# Patient Record
Sex: Female | Born: 1990 | Race: Black or African American | Hispanic: No | Marital: Single | Smoking: Never smoker
Health system: Southern US, Community
[De-identification: ages and names within clinical notes are randomized; demographics above are authoritative.]

## PROBLEM LIST (undated history)

## (undated) ENCOUNTER — Inpatient Hospital Stay (HOSPITAL_COMMUNITY): Payer: Self-pay

## (undated) DIAGNOSIS — Z8759 Personal history of other complications of pregnancy, childbirth and the puerperium: Secondary | ICD-10-CM

## (undated) DIAGNOSIS — I10 Essential (primary) hypertension: Secondary | ICD-10-CM

## (undated) DIAGNOSIS — R011 Cardiac murmur, unspecified: Secondary | ICD-10-CM

## (undated) DIAGNOSIS — D649 Anemia, unspecified: Secondary | ICD-10-CM

## (undated) DIAGNOSIS — N871 Moderate cervical dysplasia: Secondary | ICD-10-CM

## (undated) DIAGNOSIS — R87629 Unspecified abnormal cytological findings in specimens from vagina: Secondary | ICD-10-CM

## (undated) DIAGNOSIS — M419 Scoliosis, unspecified: Secondary | ICD-10-CM

## (undated) HISTORY — DX: Unspecified abnormal cytological findings in specimens from vagina: R87.629

## (undated) HISTORY — DX: Personal history of other complications of pregnancy, childbirth and the puerperium: Z87.59

## (undated) HISTORY — DX: Moderate cervical dysplasia: N87.1

## (undated) HISTORY — DX: Cardiac murmur, unspecified: R01.1

## (undated) HISTORY — DX: Anemia, unspecified: D64.9

## (undated) HISTORY — DX: Scoliosis, unspecified: M41.9

## (undated) HISTORY — PX: LEEP: SHX91

---

## 2010-04-13 ENCOUNTER — Ambulatory Visit: Admit: 2010-04-13 | Payer: Self-pay | Admitting: Obstetrics & Gynecology

## 2010-07-31 ENCOUNTER — Inpatient Hospital Stay (HOSPITAL_COMMUNITY)
Admission: AD | Admit: 2010-07-31 | Discharge: 2010-07-31 | Disposition: A | Discharge status: Home / Self Care | Payer: Medicaid Other | Source: Ambulatory Visit | Attending: Obstetrics & Gynecology | Admitting: Obstetrics & Gynecology

## 2010-07-31 DIAGNOSIS — O99891 Other specified diseases and conditions complicating pregnancy: Secondary | ICD-10-CM | POA: Insufficient documentation

## 2010-07-31 LAB — URINALYSIS, ROUTINE W REFLEX MICROSCOPIC
Bilirubin Urine: NEGATIVE
Glucose, UA: NEGATIVE mg/dL
Hgb urine dipstick: NEGATIVE
Ketones, ur: 40 mg/dL — AB
Nitrite: NEGATIVE
Protein, ur: 30 mg/dL — AB
Specific Gravity, Urine: 1.025 (ref 1.005–1.030)
Urobilinogen, UA: 1 mg/dL (ref 0.0–1.0)
pH: 7 (ref 5.0–8.0)

## 2010-07-31 LAB — URINE MICROSCOPIC-ADD ON

## 2010-07-31 LAB — POCT PREGNANCY, URINE: Preg Test, Ur: POSITIVE

## 2010-09-16 ENCOUNTER — Inpatient Hospital Stay (HOSPITAL_COMMUNITY)
Admission: AD | Admit: 2010-09-16 | Discharge: 2010-09-16 | Disposition: A | Discharge status: Home / Self Care | Payer: Medicaid Other | Source: Ambulatory Visit | Attending: Obstetrics & Gynecology | Admitting: Obstetrics & Gynecology

## 2010-09-16 DIAGNOSIS — O210 Mild hyperemesis gravidarum: Secondary | ICD-10-CM | POA: Insufficient documentation

## 2010-09-16 DIAGNOSIS — K5289 Other specified noninfective gastroenteritis and colitis: Secondary | ICD-10-CM | POA: Insufficient documentation

## 2010-09-16 DIAGNOSIS — O99891 Other specified diseases and conditions complicating pregnancy: Secondary | ICD-10-CM | POA: Insufficient documentation

## 2010-09-16 DIAGNOSIS — O9989 Other specified diseases and conditions complicating pregnancy, childbirth and the puerperium: Secondary | ICD-10-CM

## 2010-09-16 LAB — URINE MICROSCOPIC-ADD ON

## 2010-09-16 LAB — URINALYSIS, ROUTINE W REFLEX MICROSCOPIC
Bilirubin Urine: NEGATIVE
Glucose, UA: NEGATIVE mg/dL
Ketones, ur: 15 mg/dL — AB
Leukocytes, UA: NEGATIVE
Nitrite: NEGATIVE
Protein, ur: NEGATIVE mg/dL
Specific Gravity, Urine: >1.03 — ABNORMAL HIGH (ref 1.005–1.030)
Urobilinogen, UA: 0.2 mg/dL (ref 0.0–1.0)
pH: 6 (ref 5.0–8.0)

## 2010-10-31 LAB — ANTIBODY SCREEN: Antibody Screen: NEGATIVE

## 2010-10-31 LAB — RPR: RPR: NONREACTIVE

## 2010-10-31 LAB — HIV ANTIBODY (ROUTINE TESTING W REFLEX): HIV: NONREACTIVE

## 2010-10-31 LAB — ABO/RH: RH Type: POSITIVE

## 2010-10-31 LAB — RUBELLA ANTIBODY, IGM: Rubella: IMMUNE

## 2010-10-31 LAB — HEPATITIS B SURFACE ANTIGEN: Hepatitis B Surface Ag: NEGATIVE

## 2011-02-18 LAB — STREP B DNA PROBE: GBS: NEGATIVE

## 2011-03-14 ENCOUNTER — Encounter (HOSPITAL_COMMUNITY): Payer: Self-pay | Admitting: *Deleted

## 2011-03-14 ENCOUNTER — Telehealth (HOSPITAL_COMMUNITY): Payer: Self-pay | Admitting: *Deleted

## 2011-03-14 NOTE — Telephone Encounter (Signed)
Preadmission screen  

## 2011-03-15 ENCOUNTER — Other Ambulatory Visit: Payer: Self-pay | Admitting: Obstetrics and Gynecology

## 2011-03-15 DIAGNOSIS — IMO0001 Reserved for inherently not codable concepts without codable children: Secondary | ICD-10-CM

## 2011-03-19 ENCOUNTER — Encounter (HOSPITAL_COMMUNITY): Payer: Self-pay

## 2011-03-19 ENCOUNTER — Inpatient Hospital Stay (HOSPITAL_COMMUNITY)
Admission: AD | Admit: 2011-03-19 | Discharge: 2011-03-22 | DRG: 774 | Disposition: A | Discharge status: Home / Self Care | Payer: Medicaid Other | Source: Ambulatory Visit | Attending: Obstetrics and Gynecology | Admitting: Obstetrics and Gynecology

## 2011-03-19 ENCOUNTER — Inpatient Hospital Stay (HOSPITAL_COMMUNITY): Payer: Medicaid Other | Admitting: Anesthesiology

## 2011-03-19 ENCOUNTER — Encounter (HOSPITAL_COMMUNITY): Payer: Self-pay | Admitting: Anesthesiology

## 2011-03-19 DIAGNOSIS — O09299 Supervision of pregnancy with other poor reproductive or obstetric history, unspecified trimester: Secondary | ICD-10-CM

## 2011-03-19 DIAGNOSIS — IMO0002 Reserved for concepts with insufficient information to code with codable children: Principal | ICD-10-CM | POA: Diagnosis present

## 2011-03-19 DIAGNOSIS — O1490 Unspecified pre-eclampsia, unspecified trimester: Secondary | ICD-10-CM

## 2011-03-19 DIAGNOSIS — D649 Anemia, unspecified: Secondary | ICD-10-CM | POA: Diagnosis not present

## 2011-03-19 DIAGNOSIS — O9903 Anemia complicating the puerperium: Secondary | ICD-10-CM | POA: Diagnosis not present

## 2011-03-19 DIAGNOSIS — O2690 Pregnancy related conditions, unspecified, unspecified trimester: Secondary | ICD-10-CM

## 2011-03-19 LAB — COMPREHENSIVE METABOLIC PANEL
ALT: 12 U/L (ref 0–35)
AST: 20 U/L (ref 0–37)
Albumin: 2.1 g/dL — ABNORMAL LOW (ref 3.5–5.2)
Alkaline Phosphatase: 165 U/L — ABNORMAL HIGH (ref 39–117)
BUN: 6 mg/dL (ref 6–23)
CO2: 23 mEq/L (ref 19–32)
Calcium: 8.6 mg/dL (ref 8.4–10.5)
Chloride: 104 mEq/L (ref 96–112)
Creatinine, Ser: 0.71 mg/dL (ref 0.50–1.10)
GFR calc Af Amer: >90 mL/min (ref >90)
GFR calc non Af Amer: >90 mL/min (ref >90)
Glucose, Bld: 89 mg/dL (ref 70–99)
Potassium: 3.6 mEq/L (ref 3.5–5.1)
Sodium: 134 mEq/L — ABNORMAL LOW (ref 135–145)
Total Bilirubin: 0.2 mg/dL — ABNORMAL LOW (ref 0.3–1.2)
Total Protein: 6.1 g/dL (ref 6.0–8.3)

## 2011-03-19 LAB — DIFFERENTIAL
Basophils Absolute: 0 10*3/uL (ref 0.0–0.1)
Basophils Relative: 0 % (ref 0–1)
Eosinophils Absolute: 0.1 10*3/uL (ref 0.0–0.7)
Eosinophils Relative: 1 % (ref 0–5)
Lymphocytes Relative: 14 % (ref 12–46)
Lymphs Abs: 1.2 10*3/uL (ref 0.7–4.0)
Monocytes Absolute: 0.9 10*3/uL (ref 0.1–1.0)
Monocytes Relative: 10 % (ref 3–12)
Neutro Abs: 6.4 10*3/uL (ref 1.7–7.7)
Neutrophils Relative %: 74 % (ref 43–77)

## 2011-03-19 LAB — URINE MICROSCOPIC-ADD ON

## 2011-03-19 LAB — CBC
HCT: 27.9 % — ABNORMAL LOW (ref 36.0–46.0)
Hemoglobin: 8.9 g/dL — ABNORMAL LOW (ref 12.0–15.0)
MCH: 25.4 pg — ABNORMAL LOW (ref 26.0–34.0)
MCHC: 31.9 g/dL (ref 30.0–36.0)
MCV: 79.5 fL (ref 78.0–100.0)
Platelets: 200 10*3/uL (ref 150–400)
RBC: 3.51 MIL/uL — ABNORMAL LOW (ref 3.87–5.11)
RDW: 17.1 % — ABNORMAL HIGH (ref 11.5–15.5)
WBC: 8.6 10*3/uL (ref 4.0–10.5)

## 2011-03-19 LAB — URINALYSIS, ROUTINE W REFLEX MICROSCOPIC
Bilirubin Urine: NEGATIVE
Bilirubin Urine: NEGATIVE
Glucose, UA: NEGATIVE mg/dL
Glucose, UA: NEGATIVE mg/dL
Hgb urine dipstick: NEGATIVE
Ketones, ur: NEGATIVE mg/dL
Ketones, ur: NEGATIVE mg/dL
Leukocytes, UA: NEGATIVE
Leukocytes, UA: NEGATIVE
Nitrite: NEGATIVE
Nitrite: NEGATIVE
Protein, ur: >300 mg/dL — AB
Protein, ur: >300 mg/dL — AB
Specific Gravity, Urine: 1.015 (ref 1.005–1.030)
Specific Gravity, Urine: 1.02 (ref 1.005–1.030)
Urobilinogen, UA: 0.2 mg/dL (ref 0.0–1.0)
Urobilinogen, UA: 0.2 mg/dL (ref 0.0–1.0)
pH: 7.5 (ref 5.0–8.0)
pH: 7.5 (ref 5.0–8.0)

## 2011-03-19 LAB — RPR: RPR Ser Ql: NONREACTIVE

## 2011-03-19 LAB — LACTATE DEHYDROGENASE: LDH: 239 U/L (ref 94–250)

## 2011-03-19 LAB — URIC ACID: Uric Acid, Serum: 3.1 mg/dL (ref 2.4–7.0)

## 2011-03-19 MED ORDER — MAGNESIUM SULFATE 40 G IN LACTATED RINGERS - SIMPLE: 2.0000 g/h | INTRAVENOUS | Status: DC

## 2011-03-19 MED ORDER — MAGNESIUM SULFATE 40 MG/ML IJ SOLN: 4.0000 g | Freq: Once | INTRAMUSCULAR | Status: DC

## 2011-03-19 MED ORDER — PHENYLEPHRINE 40 MCG/ML (10ML) SYRINGE FOR IV PUSH (FOR BLOOD PRESSURE SUPPORT): 80.0000 ug | PREFILLED_SYRINGE | INTRAVENOUS | Status: DC | PRN

## 2011-03-19 MED ORDER — OXYTOCIN BOLUS FROM INFUSION
500.0000 mL | Freq: Once | INTRAVENOUS | Status: DC
  Filled 2011-03-19: qty 500

## 2011-03-19 MED ORDER — OXYTOCIN 20 UNITS IN LACTATED RINGERS INFUSION - SIMPLE: 125.0000 mL/h | Freq: Once | INTRAVENOUS | Status: DC

## 2011-03-19 MED ORDER — SODIUM CHLORIDE 0.9 % IJ SOLN: 3.0000 mL | INTRAMUSCULAR | Status: DC | PRN

## 2011-03-19 MED ORDER — ONDANSETRON HCL 4 MG/2ML IJ SOLN
4.0000 mg | INTRAMUSCULAR | Status: DC | PRN
  Administered 2011-03-19: 4 mg via INTRAVENOUS
  Filled 2011-03-19: qty 2

## 2011-03-19 MED ORDER — MAGNESIUM SULFATE 40 G IN LACTATED RINGERS - SIMPLE
2.0000 g/h | INTRAVENOUS | Status: AC
  Administered 2011-03-19: 2 g/h via INTRAVENOUS
  Filled 2011-03-19 (×2): qty 500

## 2011-03-19 MED ORDER — OXYTOCIN 20 UNITS IN LACTATED RINGERS INFUSION - SIMPLE
1.0000 m[IU]/min | INTRAVENOUS | Status: DC
  Administered 2011-03-19: 1 m[IU]/min via INTRAVENOUS
  Filled 2011-03-19: qty 1000

## 2011-03-19 MED ORDER — DIPHENHYDRAMINE HCL 50 MG/ML IJ SOLN: 12.5000 mg | INTRAMUSCULAR | Status: DC | PRN

## 2011-03-19 MED ORDER — PROMETHAZINE HCL 25 MG/ML IJ SOLN: 25.0000 mg | INTRAMUSCULAR | Status: DC | PRN

## 2011-03-19 MED ORDER — MISOPROSTOL 25 MCG QUARTER TABLET
25.0000 ug | ORAL_TABLET | ORAL | Status: DC | PRN
  Administered 2011-03-19: 25 ug via VAGINAL
  Filled 2011-03-19: qty 0.25

## 2011-03-19 MED ORDER — NALBUPHINE SYRINGE 5 MG/0.5 ML: 10.0000 mg | INJECTION | INTRAMUSCULAR | Status: DC | PRN

## 2011-03-19 MED ORDER — LABETALOL HCL 5 MG/ML IV SOLN
10.0000 mg | INTRAVENOUS | Status: DC | PRN
  Administered 2011-03-20: 10 mg via INTRAVENOUS
  Administered 2011-03-20: 20 mg via INTRAVENOUS
  Filled 2011-03-19: qty 8

## 2011-03-19 MED ORDER — CITRIC ACID-SODIUM CITRATE 334-500 MG/5ML PO SOLN: 30.0000 mL | ORAL | Status: DC | PRN

## 2011-03-19 MED ORDER — EPHEDRINE 5 MG/ML INJ: 10.0000 mg | INTRAVENOUS | Status: DC | PRN

## 2011-03-19 MED ORDER — LABETALOL HCL 100 MG PO TABS
100.0000 mg | ORAL_TABLET | Freq: Two times a day (BID) | ORAL | Status: DC
  Administered 2011-03-19 – 2011-03-22 (×6): 100 mg via ORAL
  Filled 2011-03-19 (×7): qty 1

## 2011-03-19 MED ORDER — OXYTOCIN 20 UNITS IN LACTATED RINGERS INFUSION - SIMPLE: 1.0000 m[IU]/min | INTRAVENOUS | Status: DC

## 2011-03-19 MED ORDER — TERBUTALINE SULFATE 1 MG/ML IJ SOLN: 0.2500 mg | Freq: Once | INTRAMUSCULAR | Status: AC | PRN

## 2011-03-19 MED ORDER — FENTANYL 2.5 MCG/ML BUPIVACAINE 1/10 % EPIDURAL INFUSION (WH - ANES): 14.0000 mL/h | INTRAMUSCULAR | Status: DC

## 2011-03-19 MED ORDER — SODIUM CHLORIDE 0.9 % IV SOLN: 250.0000 mL | INTRAVENOUS | Status: DC | PRN

## 2011-03-19 MED ORDER — ACETAMINOPHEN 325 MG PO TABS: 650.0000 mg | ORAL_TABLET | ORAL | Status: DC | PRN

## 2011-03-19 MED ORDER — LACTATED RINGERS IV SOLN
INTRAVENOUS | Status: DC
  Administered 2011-03-19 – 2011-03-20 (×4): via INTRAVENOUS

## 2011-03-19 MED ORDER — SODIUM CHLORIDE 0.9 % IJ SOLN: 3.0000 mL | Freq: Two times a day (BID) | INTRAMUSCULAR | Status: DC

## 2011-03-19 MED ORDER — LACTATED RINGERS IV SOLN: 500.0000 mL | Freq: Once | INTRAVENOUS | Status: DC

## 2011-03-19 MED ORDER — IBUPROFEN 600 MG PO TABS: 600.0000 mg | ORAL_TABLET | Freq: Four times a day (QID) | ORAL | Status: DC | PRN

## 2011-03-19 MED ORDER — LACTATED RINGERS IV SOLN: 500.0000 mL | INTRAVENOUS | Status: DC | PRN

## 2011-03-19 MED ORDER — LIDOCAINE HCL (PF) 1 % IJ SOLN
30.0000 mL | INTRAMUSCULAR | Status: DC | PRN
  Administered 2011-03-20: 30 mL via SUBCUTANEOUS
  Filled 2011-03-19: qty 30

## 2011-03-19 MED ORDER — MAGNESIUM SULFATE BOLUS VIA INFUSION
4.0000 g | Freq: Once | INTRAVENOUS | Status: AC
  Administered 2011-03-19: 4 g via INTRAVENOUS
  Filled 2011-03-19: qty 500

## 2011-03-19 MED ORDER — FLEET ENEMA 7-19 GM/118ML RE ENEM: 1.0000 | ENEMA | RECTAL | Status: DC | PRN

## 2011-03-19 MED ORDER — ZOLPIDEM TARTRATE 10 MG PO TABS: 10.0000 mg | ORAL_TABLET | Freq: Every evening | ORAL | Status: DC | PRN

## 2011-03-19 MED ORDER — OXYCODONE-ACETAMINOPHEN 5-325 MG PO TABS: 2.0000 | ORAL_TABLET | ORAL | Status: DC | PRN

## 2011-03-19 MED ORDER — LABETALOL HCL 100 MG PO TABS
100.0000 mg | ORAL_TABLET | Freq: Once | ORAL | Status: AC
  Administered 2011-03-19: 100 mg via ORAL
  Filled 2011-03-19: qty 1

## 2011-03-19 NOTE — Plan of Care (Signed)
Problem: Consults Goal: Birthing Suites Patient Information Press F2 to bring up selections list  Outcome: Completed/Met Date Met:  03/19/11  Pt > [redacted] weeks EGA, Inpatient induction and PIH (Pregnancy induced hypertension)

## 2011-03-19 NOTE — Progress Notes (Signed)
[name] is a 21 y.o. G1P0000 at [redacted]w[redacted]d with c/o of contractions since yesterday, hurting in front and back with uc, c/o of pink mucus,  No watery leaking. With +fm. Denies ha, visual spots or blurring, no upper abd pain, with swelling just to ankles.  Problem list: scoloisis Hx heart murmur Late PNC Anemia GBS-  Objective: BP 166/108  Pulse 81  Temp(Src) 99.2 F (37.3 C) (Oral)  Resp 20  Ht 5\' 7"  (1.702 m)  Wt 170 lb (77.111 kg)  BMI 26.63 kg/m2  SpO2 97%  LMP 06/25/2010      Physical Exam:  Gen: calm quiet, no acute distress Chest/Lungs: cta bilaterally  Heart/Pulse: RRR  Abdomen: soft, gravid, nontender, BX x4 quad Uterine fundus: soft, nontender Skin & Color: warm and dry  Neurological: AOx3, DTRs +2 bilaterally EXT: negative Homan's b/l, edema +2 pitting  FHT:  FHR: 130 bpm, variability: moderate,  accelerations:  Present,  decelerations:  Absent UC:   regular, every 2-6 minutes 40- 140 second duration mild SVE:   Dilation: Closed Effacement (%): 70 Exam by:: [name] CNM  Labs: No results found for this basename: WBC, HGB, HCT, MCV, PLT    Assessment and Plan:  does not have a problem list on file. 40 week IUP HTN Plan discussed assessment with Dr. Stefano [name] per telephone. PIH labs, labetalol 100 mg now, IV LR, zofran. Discussed HTN vs Pre Eclampsia with pt and family.   [name] 03/19/2011, 8:44 AM

## 2011-03-19 NOTE — Consult Note (Signed)
Subjective:  The patient denies headaches, blurred vision, and right upper quadrant tenderness. She reports that her contractions are mild.  Objective:  BP 147/105  Pulse 70  Temp(Src) 99.2 F (37.3 C) (Oral)  Resp 20  Ht 5\' 7"  (1.702 m)  Wt 77.111 kg (170 lb)  BMI 26.63 kg/m2  SpO2 97%  LMP 06/25/2010  CBC    Component Value Date/Time   WBC 8.6 03/19/2011 0910   RBC 3.51* 03/19/2011 0910   HGB 8.9* 03/19/2011 0910   HCT 27.9* 03/19/2011 0910   PLT 200 03/19/2011 0910   MCV 79.5 03/19/2011 0910   MCH 25.4* 03/19/2011 0910   MCHC 31.9 03/19/2011 0910   RDW 17.1* 03/19/2011 0910   LYMPHSABS 1.2 03/19/2011 0910   MONOABS 0.9 03/19/2011 0910   EOSABS 0.1 03/19/2011 0910   BASOSABS 0.0 03/19/2011 0910    CMP     Component Value Date/Time   NA 134* 03/19/2011 0910   K 3.6 03/19/2011 0910   CL 104 03/19/2011 0910   CO2 23 03/19/2011 0910   GLUCOSE 89 03/19/2011 0910   BUN 6 03/19/2011 0910   CREATININE 0.71 03/19/2011 0910   CALCIUM 8.6 03/19/2011 0910   PROT 6.1 03/19/2011 0910   ALBUMIN 2.1* 03/19/2011 0910   AST 20 03/19/2011 0910   ALT 12 03/19/2011 0910   ALKPHOS 165* 03/19/2011 0910   BILITOT 0.2* 03/19/2011 0910   GFRNONAA >90 03/19/2011 0910   GFRAA >90 03/19/2011 0910    Chest: Clear  Heart: Regular rate and rhythm  Abdomen: Gravid and nontender  Extremities: 1-2+ edema, no cords or masses. No redness.  Neurologic: Reflexes are 3+/4. There is no clonus present.  Assessment:  [redacted] weeks gestation  Pregnancy-induced hypertension  Must rule out preeclampsia  Unfavorable cervix  Plan:  Preeclampsia was discussed. The natural history of the disease was reviewed. Our concerns were outlined.  I discussed our management options with the patient and her mother. The options include induction of labor, catheterized the patient for a urinalysis, and discharge the patient to home to collect a 24 urine sample. The risk and benefits of each of those options were outlined. The patient elected to catheterize  the bladder and then make our decisions based on the results.  Will use labetalol to reduce her blood pressure below 140/90.  Mylinda Latina.D.

## 2011-03-19 NOTE — Progress Notes (Signed)
Patient states she is having frequent contractions with a little brown show. Reports good fetal movement and no leaking.

## 2011-03-19 NOTE — L&D Delivery Note (Signed)
Delivery Note  Pt was complete and pushed well, on r side, fhr with variables but overall reassuring   At 4:50 AM a viable female was delivered via Vaginal, Spontaneous Delivery (Presentation: Left Occiput Anterior). With posterior arm around neck and occult cord at neck, shoulders del easily  APGAR: 9, 9; weight 6 lb 4 oz (2835 g).   Placenta status: Intact, Spontaneous.  Cord: 3 vessels with the following complications: None.   Placenta sent to path Anesthesia: Local 1% lidocaine  Episiotomy: None Lacerations: L 1st degree vaginal Suture Repair: 3.0 vicryl rapide Est. Blood Loss (mL): 350  Mom to postpartum.  Baby to nursery-stable. Dr Stefano Gaul notified Will D/C 24hr urine Continue Mag  Sulfate until at 0530am on 03-21-11   Cassady Stanczak M 03/20/2011, 5:25 AM

## 2011-03-19 NOTE — Anesthesia Preprocedure Evaluation (Deleted)
Anesthesia Evaluation  Patient identified by MRN, date of birth, ID band Patient awake    Reviewed: Allergy & Precautions, H&P , Patient's Chart, lab work & pertinent test results  Airway Mallampati: IV TM Distance: >3 FB Neck ROM: full  Mouth opening: Limited Mouth Opening  Dental  (+) Teeth Intact   Pulmonary  clear to auscultation        Cardiovascular hypertension (Patient has PIH), regular Normal    Neuro/Psych    GI/Hepatic   Endo/Other    Renal/GU      Musculoskeletal   Abdominal   Peds  Hematology   Anesthesia Other Findings       Reproductive/Obstetrics (+) Pregnancy                           Anesthesia Physical Anesthesia Plan  ASA: II  Anesthesia Plan: Epidural   Post-op Pain Management:    Induction:   Airway Management Planned:   Additional Equipment:   Intra-op Plan:   Post-operative Plan:   Informed Consent: I have reviewed the patients History and Physical, chart, labs and discussed the procedure including the risks, benefits and alternatives for the proposed anesthesia with the patient or authorized representative who has indicated his/her understanding and acceptance.   Dental Advisory Given  Plan Discussed with:   Anesthesia Plan Comments: (Labs checked- platelets confirmed with RN in room. Fetal heart tracing, per RN, reported to be stable enough for sitting procedure. Discussed epidural, and patient consents to the procedure and the increased difficulty with scoliosis:  included risk of possible headache,backache, failed block, allergic reaction, and nerve injury. This patient was asked if she had any questions or concerns before the procedure started. )        Anesthesia Quick Evaluation

## 2011-03-19 NOTE — Progress Notes (Signed)
Patient ID: [name], female   DOB: [birth year], 21 y.o.   MRN: [id number] .Subjective: Feels ctx more, but denies pain meds, lots of family at bs. In good spirits. Denies bloody show or LOF, denies HA/N/VRUQ pain or blurry vision   Objective: BP 157/97  Pulse 90  Temp(Src) 98.7 F (37.1 C) (Oral)  Resp 18  Ht 5\' 7"  (1.702 m)  Wt 77.111 kg (170 lb)  BMI 26.63 kg/m2  SpO2 98%  LMP 06/25/2010   Filed Vitals:   03/19/11 1701 03/19/11 1803 03/19/11 1901 03/19/11 2001  BP: 142/88 134/112 156/98 157/97  Pulse: 80 85 90 90  Temp:    98.6 F (37 C)  TempSrc:    Oral  Resp: 18 20 18 18   Height:      Weight:      SpO2:         FHT:  FHR: 130 bpm, variability: moderate,  accelerations:  Present,  decelerations:  Absent UC:   regular, every 3-5 minutes SVE:   Dilation: 2 Effacement (%): 70 Station: -2 Exam by:: S Kahliya Fraleigh, CNM  Swept membranes, cervix to R side and posterior rcv'd dose of lebetalol in MAU   Assessment / Plan: IOL secondary to PIH/vs. Pre-eclampsia GBS neg S/p cytotec, reg ctx pattern now, some cervical change  No s/s of Mag toxicity  Will start low dose pitocin, ambien for sleep   Fetal Wellbeing:  Category I Pain Control:  declines at this time, plans IV pain meds, discussed epidural, pt is concerned about epidural secondary to hx scoliosis, anesthesia consult requested  C/W Dr. Darrick Meigs M 03/19/2011, 8:25 PM

## 2011-03-19 NOTE — Progress Notes (Signed)
Pt has scoliosis. Dr Jean Rosenthal at bedside to evaluate pt for epidural placement when patient is ready.

## 2011-03-19 NOTE — Progress Notes (Signed)
Subjective:  The patient reports that her contractions are less frequent and less painful at this time.  Objective:  BP 149/98  Pulse 73  Temp(Src) 98.5 F (36.9 C) (Oral)  Resp 22  Ht 5\' 7"  (1.702 m)  Wt 77.111 kg (170 lb)  BMI 26.63 kg/m2  SpO2 97%  LMP 06/25/2010  Catheter UA: Greater than 300 protein  Nonstress test: Category 1, mild contractions  Assessment:  [redacted] weeks gestation  Preeclampsia  Unfavorable cervix  Plan:  I again discussed our management options. The patient agrees with induction of labor at this time. We will begin with Cytotec intravaginally and then follow with Pitocin. We will also start magnesium.  Mylinda Latina.D.

## 2011-03-19 NOTE — Progress Notes (Signed)
24 hour urine started 03/19/11 at 0850am.

## 2011-03-19 NOTE — H&P (Signed)
[name] is a 21 y.o. female presenting for contractions and bloody mucus, denies srom, with +FM, denies ha,visual spots or blurring, with swelling to ankles only  Problem list scoliosis Heart murmur Late PNC Anemia  History OB History    Grav Para Term Preterm Abortions TAB SAB Ect Mult Living   1 0 0 0 0 0 0 0 0 0      Past Medical History  Diagnosis Date  . Anemia   . Scoliosis   . Heart murmur   . Late prenatal care    Past Surgical History  Procedure Date  . No past surgeries    Family History: family history includes Anemia in her mother; Heart attack in her maternal grandmother; Heart murmur in her brother; Migraines in her mother; and Mitral valve prolapse in her maternal grandmother and sister. Social History:  reports that she has never smoked. She has never used smokeless tobacco. She reports that she does not drink alcohol or use illicit drugs.  ROS  Dilation: Closed Effacement (%): 70 Exam by:: Judie Petit Glenys Snader CNM Blood pressure 149/98, pulse 73, temperature 98.5 F (36.9 C), temperature source Oral, resp. rate 22, height 5\' 7"  (1.702 m), weight 170 lb (77.111 kg), last menstrual period 06/25/2010, SpO2 97.00%. Maternal Exam:  Introitus: Vaginal discharge: bloody show.    Physical Exam  Constitutional: She appears well-nourished.  HENT:  Head: Normocephalic.  Cardiovascular: Normal rate and regular rhythm.   Respiratory: Effort normal and breath sounds normal.  GI: Soft. Bowel sounds are normal.  Genitourinary: Vagina normal and uterus normal. Vaginal discharge: bloody show.  Musculoskeletal: Normal range of motion.  Neurological: She is alert. Abnormal reflex: DTRS +2 bilaterally, no clonus, +2 edema lower legs.  Skin: Skin is warm.  Psychiatric: Her behavior is normal.    Prenatal labs: ABO, Rh: A/Positive/-- (08/15 0000) Antibody: Negative (08/15 0000) Rubella: Immune (08/15 0000) RPR: Nonreactive (08/15 0000)  HBsAg: Negative (08/15 0000)    HIV: Non-reactive (08/15 0000)  GBS: Negative (12/03 0000)   Assessment/Plan: 40 week IUP Pre eclampsia Plan: PIH labs reviewed, ua was clean catch, now cath UA with greater than 300 protein, admit for cytotec ripening, pitocin when indicated. Pt and family verbalized understanding of diagnosis and medications. Collaboration with Dr. Stefano Gaul per telephone. Continue labetalol 100 mg bid.  Maclin Guerrette 03/19/2011, 2:15 PM

## 2011-03-20 ENCOUNTER — Encounter (HOSPITAL_COMMUNITY): Payer: Self-pay | Admitting: *Deleted

## 2011-03-20 ENCOUNTER — Other Ambulatory Visit: Payer: Self-pay | Admitting: Obstetrics and Gynecology

## 2011-03-20 LAB — COMPREHENSIVE METABOLIC PANEL
ALT: 13 U/L (ref 0–35)
AST: 23 U/L (ref 0–37)
Albumin: 1.8 g/dL — ABNORMAL LOW (ref 3.5–5.2)
Alkaline Phosphatase: 154 U/L — ABNORMAL HIGH (ref 39–117)
BUN: 6 mg/dL (ref 6–23)
CO2: 20 mEq/L (ref 19–32)
Calcium: 7.7 mg/dL — ABNORMAL LOW (ref 8.4–10.5)
Chloride: 103 mEq/L (ref 96–112)
Creatinine, Ser: 0.68 mg/dL (ref 0.50–1.10)
GFR calc Af Amer: >90 mL/min (ref >90)
GFR calc non Af Amer: >90 mL/min (ref >90)
Glucose, Bld: 144 mg/dL — ABNORMAL HIGH (ref 70–99)
Potassium: 4.1 mEq/L (ref 3.5–5.1)
Sodium: 135 mEq/L (ref 135–145)
Total Bilirubin: 0.3 mg/dL (ref 0.3–1.2)
Total Protein: 5.2 g/dL — ABNORMAL LOW (ref 6.0–8.3)

## 2011-03-20 LAB — CBC
HCT: 27.5 % — ABNORMAL LOW (ref 36.0–46.0)
HCT: 21.5 % — ABNORMAL LOW (ref 36.0–46.0)
Hemoglobin: 8.7 g/dL — ABNORMAL LOW (ref 12.0–15.0)
Hemoglobin: 6.9 g/dL — CL (ref 12.0–15.0)
MCH: 25.1 pg — ABNORMAL LOW (ref 26.0–34.0)
MCH: 24.9 pg — ABNORMAL LOW (ref 26.0–34.0)
MCHC: 31.6 g/dL (ref 30.0–36.0)
MCHC: 31.6 g/dL (ref 30.0–36.0)
MCV: 79.3 fL (ref 78.0–100.0)
MCV: 78.8 fL (ref 78.0–100.0)
Platelets: 207 10*3/uL (ref 150–400)
Platelets: 184 10*3/uL (ref 150–400)
RBC: 3.47 MIL/uL — ABNORMAL LOW (ref 3.87–5.11)
RBC: 2.73 MIL/uL — ABNORMAL LOW (ref 3.87–5.11)
RDW: 17.5 % — ABNORMAL HIGH (ref 11.5–15.5)
RDW: 17.6 % — ABNORMAL HIGH (ref 11.5–15.5)
WBC: 10.7 10*3/uL — ABNORMAL HIGH (ref 4.0–10.5)
WBC: 10.8 10*3/uL — ABNORMAL HIGH (ref 4.0–10.5)

## 2011-03-20 LAB — MRSA PCR SCREENING: MRSA by PCR: NEGATIVE

## 2011-03-20 LAB — MAGNESIUM: Magnesium: 5.4 mg/dL — ABNORMAL HIGH (ref 1.5–2.5)

## 2011-03-20 MED ORDER — IBUPROFEN 600 MG PO TABS
600.0000 mg | ORAL_TABLET | Freq: Four times a day (QID) | ORAL | Status: DC
  Filled 2011-03-20 (×5): qty 1

## 2011-03-20 MED ORDER — OXYCODONE-ACETAMINOPHEN 5-325 MG PO TABS: 1.0000 | ORAL_TABLET | ORAL | Status: DC | PRN

## 2011-03-20 MED ORDER — PRENATAL MULTIVITAMIN CH
1.0000 | ORAL_TABLET | Freq: Every day | ORAL | Status: DC
  Administered 2011-03-20 – 2011-03-21 (×2): 1 via ORAL
  Filled 2011-03-20 (×3): qty 1

## 2011-03-20 MED ORDER — SENNOSIDES-DOCUSATE SODIUM 8.6-50 MG PO TABS
2.0000 | ORAL_TABLET | Freq: Every day | ORAL | Status: DC
  Administered 2011-03-20 – 2011-03-21 (×2): 2 via ORAL

## 2011-03-20 MED ORDER — SIMETHICONE 80 MG PO CHEW: 80.0000 mg | CHEWABLE_TABLET | ORAL | Status: DC | PRN

## 2011-03-20 MED ORDER — POLYSACCHARIDE IRON 150 MG PO CAPS
150.0000 mg | ORAL_CAPSULE | Freq: Every day | ORAL | Status: DC
  Administered 2011-03-21 – 2011-03-22 (×2): 150 mg via ORAL
  Filled 2011-03-20 (×3): qty 1

## 2011-03-20 MED ORDER — FENTANYL CITRATE 0.05 MG/ML IJ SOLN
100.0000 ug | INTRAMUSCULAR | Status: DC | PRN
  Administered 2011-03-20: 100 ug via INTRAVENOUS
  Filled 2011-03-20: qty 2

## 2011-03-20 MED ORDER — WITCH HAZEL-GLYCERIN EX PADS: 1.0000 "application " | MEDICATED_PAD | CUTANEOUS | Status: DC | PRN

## 2011-03-20 MED ORDER — DIBUCAINE 1 % RE OINT: 1.0000 "application " | TOPICAL_OINTMENT | RECTAL | Status: DC | PRN

## 2011-03-20 MED ORDER — DIPHENHYDRAMINE HCL 25 MG PO CAPS: 25.0000 mg | ORAL_CAPSULE | Freq: Four times a day (QID) | ORAL | Status: DC | PRN

## 2011-03-20 MED ORDER — LANOLIN HYDROUS EX OINT: TOPICAL_OINTMENT | CUTANEOUS | Status: DC | PRN

## 2011-03-20 MED ORDER — BENZOCAINE-MENTHOL 20-0.5 % EX AERO: 1.0000 "application " | INHALATION_SPRAY | CUTANEOUS | Status: DC | PRN

## 2011-03-20 MED ORDER — LACTATED RINGERS IV SOLN: INTRAVENOUS | Status: DC

## 2011-03-20 MED ORDER — ZOLPIDEM TARTRATE 5 MG PO TABS: 5.0000 mg | ORAL_TABLET | Freq: Every evening | ORAL | Status: DC | PRN

## 2011-03-20 MED ORDER — ONDANSETRON HCL 4 MG PO TABS: 4.0000 mg | ORAL_TABLET | ORAL | Status: DC | PRN

## 2011-03-20 MED ORDER — ONDANSETRON HCL 4 MG/2ML IJ SOLN: 4.0000 mg | INTRAMUSCULAR | Status: DC | PRN

## 2011-03-20 MED ORDER — TETANUS-DIPHTH-ACELL PERTUSSIS 5-2.5-18.5 LF-MCG/0.5 IM SUSP
0.5000 mL | Freq: Once | INTRAMUSCULAR | Status: AC
  Administered 2011-03-21: 0.5 mL via INTRAMUSCULAR
  Filled 2011-03-20 (×2): qty 0.5

## 2011-03-20 NOTE — Progress Notes (Signed)
Pt did not receive epidural. Dr Jean Rosenthal notified of documentation of epidural placement but stated he could not remove note.

## 2011-03-20 NOTE — Progress Notes (Signed)
Patient ID: [name], female   DOB: 1990-07-07, 21 y.o.   MRN: [id number] .Subjective:  Sleeping after fentanyl, coping well, family at bs  Objective: BP 152/104  Pulse 97  Temp(Src) 98.1 F (36.7 C) (Oral)  Resp 20  Ht 5\' 7"  (1.702 m)  Wt 77.111 kg (170 lb)  BMI 26.63 kg/m2  SpO2 98%  LMP 06/25/2010   Filed Vitals:   03/20/11 0131 03/20/11 0201 03/20/11 0220 03/20/11 0231  BP: 156/96 158/109 166/106 152/104  Pulse: 104 101 96 97  Temp:      TempSrc:      Resp: 20 20 20 20   Height:      Weight:      SpO2:        Has received 1 dose 10mg  IV labetalol, and 1 dose 20mg  IV labetalol  FHT:  FHR: 130 bpm, variability: moderate,  accelerations:  Present,  decelerations:  Present early variables UC:   regular, every 2-4 minutes SVE:   Dilation: 9 Effacement (%): 70 Station: 0 Exam by:: S. Llillard, CNM AROM clear fluid  Pitocin at 2mu  Assessment / Plan: IOL secondary to [facility]    Fetal Wellbeing:  Category I Pain Control:  Fentanyl  BP responding to IV labetalol  24hour urine in progress Will CTO closely Anticipate SVD  Update physician PRN    [name] 03/20/2011, 2:36 AM

## 2011-03-20 NOTE — Progress Notes (Signed)
Patient ID: [name], female   DOB: 1990/03/26, 21 y.o.   MRN: [id number] .Subjective: Breathing w ctx, feels some pressure, has bloody show, teary, requests IV pain meds   Objective: BP 152/102  Pulse 106  Temp(Src) 98.1 F (36.7 C) (Oral)  Resp 20  Ht 5\' 7"  (1.702 m)  Wt 77.111 kg (170 lb)  BMI 26.63 kg/m2  SpO2 98%  LMP 06/25/2010  Filed Vitals:   03/19/11 2201 03/19/11 2301 03/20/11 0001 03/20/11 0034  BP: 151/92 133/88 164/105 152/102  Pulse: 123 91 99 106  Temp:   98.1 F (36.7 C)   TempSrc:   Oral   Resp: 20 20 20 20   Height:      Weight:      SpO2:          FHT:  FHR: 130 bpm, variability: moderate,  accelerations:  Present,  decelerations:  Present early decels UC:   regular, every 2-4 minutes SVE:   Dilation: 2 Effacement (%): 70 Station: -2 Exam by:: S [place], CNM  VE=9cm with BBOW vtx at -1  Pitocin at 2mu  Assessment / Plan: IOL secondary to PIH, progressing after cytotec and pitocin GBS neg  Will give IV fentanyl , continued support BP's stable  Fetal Wellbeing:  Category II Pain Control:  Labor support without medications  DR Stefano Gaul updated  [name] 03/20/2011, 1:14 AM

## 2011-03-20 NOTE — Progress Notes (Signed)
New admission - pt rec'd via wheelchair from [facility].  SVD 03/20/2011 at 0450. No anesthesia - local for repair.  Term,  viable female infant.  Apgars 9/9.  Wt 6 lbs., 4 oz.  Bottlefeeding, CN.   Transferred easily to bed from [facility].  Gait steady - has voided in BS.  SR up x 2.  Moniter applied and VS obtained.   IV LR inf at 137ml/hr.  Magnesium Sulfate inf at 2gms/57ml/hr.    IV site assessed.  Family, baby  with pt.

## 2011-03-20 NOTE — Progress Notes (Signed)
UR chart review completed.  

## 2011-03-20 NOTE — Progress Notes (Signed)
Post Partum Day 0 Subjective: Doing well, "just tired".  Denies HA, visual symptoms, epigastric pain.  Baby in arms, family at bedside.  Bottlefeeding.  Objective: Blood pressure 139/79, pulse 103, temperature 97.8 F (36.6 C), temperature source Oral, resp. rate 18, height 5\' 7"  (1.702 m), weight 72.802 kg (160 lb 8 oz), last menstrual period 06/25/2010, SpO2 99.00%, unknown if currently breastfeeding.  Filed Vitals:   03/20/11 0634 03/20/11 0700 03/20/11 0800 03/20/11 0900  BP: 161/88 157/97 127/87 139/79  Pulse: 99 103    Temp: 97.8 F (36.6 C) 97.8 F (36.6 C)    TempSrc: Oral Oral    Resp: 18     Height: 5\' 7"  (1.702 m)     Weight: 72.802 kg (160 lb 8 oz)     SpO2: 98% 98% 99%    I/O last 3 completed shifts: In: 5405.6 [P.O.:2280; I.V.:3125.6] Out: 4200 [Urine:3250; Emesis/NG output:600; Blood:350] Total I/O In: 350 [I.V.:350] Out: 500 [Urine:500]  +1300 last 24 hours, but good hourly output.  Physical Exam:  General: alert Lochia: appropriate Uterine Fundus: firm Incision: healing well DVT Evaluation: No evidence of DVT seen on physical exam. Negative Homan's sign. Calf/Ankle edema is present, 1+.  DTR 1+ without clonus.   Basename 03/20/11 0545 03/19/11 0910  HGB 8.7* 8.9*  HCT 27.5* 27.9*   Hgb on admission 8.9 (10.7 at NOB, 9.8 at 28 weeks)  Assessment/Plan: PP day 0 Pre-eclampsia Anemia  Plan: Continue magnesium sulfate x 24 hours post-delivery (till approx. 5 am 03/21/11).  Dr. Pennie Rushing will write orders. Check orthostatics FeSO4 q day. Reviewed plan of care with patient and family.   LOS: 1 day   Daiquan Resnik, Chip Boer 03/20/2011, 10:42 AM

## 2011-03-20 NOTE — Progress Notes (Signed)
Patient ID: [name], female   DOB: [birth year], 21 y.o.   MRN: [id number] .Subjective: Coping with ctx, denies need for further pain meds.    Objective: BP 159/101  Pulse 100  Temp(Src) 98.4 F (36.9 C) (Oral)  Resp 20  Ht 5\' 7"  (1.702 m)  Wt 77.111 kg (170 lb)  BMI 26.63 kg/m2  SpO2 98%  LMP 06/25/2010  Filed Vitals:   03/20/11 0220 03/20/11 0231 03/20/11 0301 03/20/11 0331  BP: 166/106 152/104 157/100 159/101  Pulse: 96 97 95 100  Temp:  98.4 F (36.9 C)    TempSrc:  Oral    Resp: 20 20 20 20   Height:      Weight:      SpO2:         FHT:  FHR: 140 bpm, variability: moderate,  accelerations:  Present,  decelerations:  Present early variables UC:   regular, every 2-4 minutes SVE:   Dilation: 9 Effacement (%): 70 Station: 0 Exam by:: S Blu Mcglaun, CNM  Pitocin at 2mu  Assessment / Plan: Protracted active phase IUPC placed Will augment pitocin for adequate MVU's  BP remains high, but stable   Fetal Wellbeing:  Category I Pain Control:  Fentanyl  Update physician PRN  [name] 03/20/2011, 3:53 AM

## 2011-03-21 LAB — COMPREHENSIVE METABOLIC PANEL
ALT: 13 U/L (ref 0–35)
AST: 22 U/L (ref 0–37)
Albumin: 1.6 g/dL — ABNORMAL LOW (ref 3.5–5.2)
Alkaline Phosphatase: 120 U/L — ABNORMAL HIGH (ref 39–117)
BUN: 6 mg/dL (ref 6–23)
CO2: 24 mEq/L (ref 19–32)
Calcium: 7.4 mg/dL — ABNORMAL LOW (ref 8.4–10.5)
Chloride: 104 mEq/L (ref 96–112)
Creatinine, Ser: 0.74 mg/dL (ref 0.50–1.10)
GFR calc Af Amer: >90 mL/min (ref >90)
GFR calc non Af Amer: >90 mL/min (ref >90)
Glucose, Bld: 120 mg/dL — ABNORMAL HIGH (ref 70–99)
Potassium: 4 mEq/L (ref 3.5–5.1)
Sodium: 134 mEq/L — ABNORMAL LOW (ref 135–145)
Total Bilirubin: 0.1 mg/dL — ABNORMAL LOW (ref 0.3–1.2)
Total Protein: 4.9 g/dL — ABNORMAL LOW (ref 6.0–8.3)

## 2011-03-21 LAB — DIFFERENTIAL
Basophils Absolute: 0 10*3/uL (ref 0.0–0.1)
Basophils Relative: 0 % (ref 0–1)
Eosinophils Absolute: 0 10*3/uL (ref 0.0–0.7)
Eosinophils Relative: 0 % (ref 0–5)
Lymphocytes Relative: 14 % (ref 12–46)
Lymphs Abs: 1.5 10*3/uL (ref 0.7–4.0)
Monocytes Absolute: 1.1 10*3/uL — ABNORMAL HIGH (ref 0.1–1.0)
Monocytes Relative: 10 % (ref 3–12)
Neutro Abs: 8.4 10*3/uL — ABNORMAL HIGH (ref 1.7–7.7)
Neutrophils Relative %: 76 % (ref 43–77)

## 2011-03-21 LAB — CBC
HCT: 23.1 % — ABNORMAL LOW (ref 36.0–46.0)
Hemoglobin: 7.3 g/dL — ABNORMAL LOW (ref 12.0–15.0)
MCH: 25.3 pg — ABNORMAL LOW (ref 26.0–34.0)
MCHC: 31.6 g/dL (ref 30.0–36.0)
MCV: 79.9 fL (ref 78.0–100.0)
Platelets: 204 10*3/uL (ref 150–400)
RBC: 2.89 MIL/uL — ABNORMAL LOW (ref 3.87–5.11)
RDW: 17.8 % — ABNORMAL HIGH (ref 11.5–15.5)
WBC: 11.1 10*3/uL — ABNORMAL HIGH (ref 4.0–10.5)

## 2011-03-21 LAB — MAGNESIUM: Magnesium: 5.6 mg/dL — ABNORMAL HIGH (ref 1.5–2.5)

## 2011-03-21 MED ORDER — COMPLETENATE 29-1 MG PO CHEW
1.0000 | CHEWABLE_TABLET | Freq: Every day | ORAL | Status: DC
  Administered 2011-03-22: 1 via ORAL
  Filled 2011-03-21: qty 1

## 2011-03-21 MED ORDER — IBUPROFEN 100 MG/5ML PO SUSP
600.0000 mg | Freq: Four times a day (QID) | ORAL | Status: DC
  Administered 2011-03-21 – 2011-03-22 (×3): 600 mg via ORAL
  Filled 2011-03-21 (×4): qty 30

## 2011-03-21 MED ORDER — NIFEDIPINE ER 30 MG PO TB24
30.0000 mg | ORAL_TABLET | Freq: Every day | ORAL | Status: DC
  Administered 2011-03-21 – 2011-03-22 (×2): 30 mg via ORAL
  Filled 2011-03-21 (×2): qty 1

## 2011-03-21 NOTE — Progress Notes (Signed)
Hgb reported of 6.9. Updated S. Lillard, orders to repeat CBC in am given. Pt denies any dizziness or weakness when ambulating.

## 2011-03-21 NOTE — Progress Notes (Addendum)
Post Partum Day 1 Subjective: Up ad lib, no syncope or dizziness.  Bottlefeeding.  Family at bedside.  Transferred out of AICU this am, after magnesium d/c'd at approx 5am.  Not using any pain medication at present.  Objective: Blood pressure 144/79, pulse 90, temperature 98 F (36.7 C), temperature source Oral, resp. rate 16, height 5\' 7"  (1.702 m), weight 70.262 kg (154 lb 14.4 oz), last menstrual period 06/25/2010, SpO2 99.00%, unknown if currently breastfeeding.  Filed Vitals:   03/21/11 0700 03/21/11 0800 03/21/11 0900 03/21/11 1025  BP: 144/97 144/99 142/91 144/79  Pulse: 93 89 93 90  Temp:  97.9 F (36.6 C)  98 F (36.7 C)  TempSrc:  Oral  Oral  Resp:    16  Height:      Weight: 70.262 kg (154 lb 14.4 oz)     SpO2: 97% 99% 99%    Orthostatics stable this am  Physical Exam:  General: alert, pale Lochia: appropriate Uterine Fundus: firm Incision: healing well DVT Evaluation: No evidence of DVT seen on physical exam. Negative Homan's sign. 1-2 + edema, DTR 1-2+ without clonus   Basename 03/21/11 0530 03/20/11 2315  HGB 7.3* 6.9*  HCT 23.1* 21.5*  Hgb nadir 6.9 last night.  Assessment/Plan: Resolving pre-eclampsia--BP still mildly elevated. Anemia, without hemodynamic instability  Plan: Continue current care. Consulted with Dr. Valente David Procardia 30 XL mg q day. Anticipate d/c 03/22/11.   LOS: 2 days   [name] 03/21/2011, 12:56 PM    Agree with above - AYR

## 2011-03-21 NOTE — Plan of Care (Signed)
Problem: Phase I Progression Outcomes Goal: Other Phase I Outcomes/Goals Outcome: Completed/Met Date Met:  03/21/11 Post Mag and on Labetalol for increased B/P.

## 2011-03-22 DIAGNOSIS — O09299 Supervision of pregnancy with other poor reproductive or obstetric history, unspecified trimester: Secondary | ICD-10-CM

## 2011-03-22 MED ORDER — MEDROXYPROGESTERONE ACETATE 150 MG/ML IM SUSP: 150.0000 mg | INTRAMUSCULAR | Status: DC

## 2011-03-22 MED ORDER — IBUPROFEN 100 MG/5ML PO SUSP: 600.0000 mg | Freq: Four times a day (QID) | ORAL | Status: DC | PRN

## 2011-03-22 MED ORDER — MEDROXYPROGESTERONE ACETATE 150 MG/ML IM SUSP
150.0000 mg | Freq: Once | INTRAMUSCULAR | Status: AC
  Administered 2011-03-22: 150 mg via INTRAMUSCULAR

## 2011-03-22 MED ORDER — NIFEDIPINE ER 30 MG PO TB24: 60.0000 mg | ORAL_TABLET | Freq: Every day | ORAL | Status: DC

## 2011-03-22 NOTE — Discharge Summary (Signed)
Physician Discharge Summary  Patient ID: Lucia Harm MRN: [id number] DOB/AGE: December 12, 1990 21 y.o.  Admit date: 03/19/2011 Discharge date: 03/22/2011    Admission Diagnoses:pre eclampsia  Discharge Diagnoses:  Active Problems:  Vaginal delivery  Pre-eclampsia in third trimester anemia  Discharged Condition: stable  Hospital Course: induction for pre eclampsia, cytotec, pitocin, SVD, Magnesium for labor and recovery, normal involution.  Consults: none  Significant Diagnostic Studies: labs:  Treatments:  Discharge Exam: Blood pressure 128/81, pulse 84, temperature 97.8 F (36.6 C), temperature source Oral, resp. rate 18, height 5\' 7"  (1.702 m), weight 149 lb 3.2 oz (67.677 kg), last menstrual period 06/25/2010, SpO2 98.00%, unknown if currently breastfeeding. General appearance: alert, cooperative and no distress Resp: clear to auscultation bilaterally AP regular rate, abd soft, nt, ff 4 below u, small serosa flow, - homans sign bilaterally, +2 pitting edema, DTRS +2 bilaterally no clonus, 5 # wt loss in 24 hours  Disposition: Home or Self Care  Discharge Orders    Future Orders Please Complete By Expires   Discharge instructions      Comments:   Continue iron, prenatal vitamin     Medication List  As of 03/22/2011 12:04 PM   START taking these medications         ibuprofen 100 MG/5ML suspension   Commonly known as: ADVIL,MOTRIN   Take 30 mLs (600 mg total) by mouth every 6 (six) hours as needed for fever.      medroxyPROGESTERone 150 MG/ML injection   Commonly known as: DEPO-PROVERA   Inject 1 mL (150 mg total) into the muscle every 3 (three) months.      NIFEdipine 30 MG 24 hr tablet   Commonly known as: PROCARDIA-XL/ADALAT CC   Take 2 tablets (60 mg total) by mouth daily.         CONTINUE taking these medications         ferrous fumarate 325 (106 FE) MG Tabs   Commonly known as: HEMOCYTE - 106 mg FE      prenatal multivitamin Tabs          Where to  get your medications    These are the prescriptions that you need to pick up.   You may get these medications from any pharmacy.         ibuprofen 100 MG/5ML suspension   medroxyPROGESTERone 150 MG/ML injection   NIFEdipine 30 MG 24 hr tablet           Follow-up Information    Follow up with [facility] in 6 weeks.       Plan home on procardia xl 60 mg, dc labetalol, f/o smart start RN on Tues, office 1 week, depo provera today, s/s PIH shortness of breath to report reviewed, collaboration with Dr. Pennie Rushing at [facility].   SignedLavera Guise 03/22/2011, 12:04 PM

## 2011-03-25 ENCOUNTER — Inpatient Hospital Stay (HOSPITAL_COMMUNITY): Admission: RE | Admit: 2011-03-25 | Payer: Medicaid Other | Source: Ambulatory Visit

## 2011-06-13 ENCOUNTER — Other Ambulatory Visit (INDEPENDENT_AMBULATORY_CARE_PROVIDER_SITE_OTHER): Payer: Medicaid Other

## 2011-06-13 DIAGNOSIS — Z3040 Encounter for surveillance of contraceptives, unspecified: Secondary | ICD-10-CM

## 2011-09-04 ENCOUNTER — Other Ambulatory Visit: Payer: Medicaid Other

## 2011-09-18 ENCOUNTER — Encounter: Payer: Self-pay | Admitting: Family Medicine

## 2011-09-18 ENCOUNTER — Ambulatory Visit (INDEPENDENT_AMBULATORY_CARE_PROVIDER_SITE_OTHER): Payer: Medicaid Other | Admitting: Family Medicine

## 2011-09-18 VITALS — BP 131/79 | HR 74 | Temp 97.5°F | Ht 67.0 in | Wt 152.2 lb

## 2011-09-18 DIAGNOSIS — T7840XA Allergy, unspecified, initial encounter: Secondary | ICD-10-CM

## 2011-09-18 DIAGNOSIS — Z309 Encounter for contraceptive management, unspecified: Secondary | ICD-10-CM

## 2011-09-18 DIAGNOSIS — D649 Anemia, unspecified: Secondary | ICD-10-CM

## 2011-09-18 DIAGNOSIS — Z9109 Other allergy status, other than to drugs and biological substances: Secondary | ICD-10-CM | POA: Insufficient documentation

## 2011-09-18 LAB — POCT HEMOGLOBIN: Hemoglobin: 10.3 g/dL — AB (ref 12.2–16.2)

## 2011-09-18 MED ORDER — NORGESTIMATE-ETH ESTRADIOL 0.25-35 MG-MCG PO TABS: 1.0000 | ORAL_TABLET | Freq: Every day | ORAL | Status: DC

## 2011-09-18 NOTE — Progress Notes (Signed)
  Subjective:    Patient ID: [name], female    DOB: Aug 08, 1990, 21 y.o.   MRN: [id number]  HPI New patient to establish care.  1. Rash with sunlight exposure. Has noticed skin redness, bumps after going into sunlight. With itching. None today due to being overcast. Has not tried any therapy or sunscreen.  2. Anemia. Noted during pregnancy, had some blood loss intrapartum. Has stopped taking iron supplements. Denies syncope, fatigue, blood loss in stool. Restarted periods. Lab Results  Component Value Date   HGB 7.3* 03/21/2011   3. Contraception. Desires to restart depo, but cannot afford this out of pocket today. Has taken OCPs previously, and would agree to this as opposed to being without contraception. Discussed risks/benefits including potential for elevated BP.   Past Medical History  Diagnosis Date  . Anemia   . Scoliosis   . Heart murmur   . Late prenatal care   . Gestational hypertension    Past Surgical History  Procedure Date  . No past surgeries    History   Social History  . Marital Status: Single    Spouse Name: N/A    Number of Children: N/A  . Years of Education: N/A   Occupational History  . Not on file.   Social History Main Topics  . Smoking status: Never Smoker   . Smokeless tobacco: Never Used  . Alcohol Use: No  . Drug Use: No  . Sexually Active: Yes   Other Topics Concern  . Not on file   Social History Narrative   Lives with husband [name] and daughter [name] Puna (2013). Is unemployed, completed some college.   Family History  Problem Relation Age of Onset  . Anemia Mother   . Migraines Mother   . Mitral valve prolapse Sister   . Heart murmur Brother   . Heart attack Maternal Grandmother   . Mitral valve prolapse Maternal Grandmother   . Heart disease Maternal Grandmother   . Diabetes Maternal Grandmother   . Hyperlipidemia Maternal Grandmother   . Hypertension Maternal Grandmother    Review of Systems See HPI otherwise  negative.      Objective:   Physical Exam  Vitals reviewed. Constitutional: She is oriented to person, place, and time. She appears well-developed and well-nourished. No distress.  HENT:  Head: Normocephalic and atraumatic.  Eyes: EOM are normal. Pupils are equal, round, and reactive to light.  Neck: Neck supple.  Cardiovascular: Normal rate, regular rhythm and normal heart sounds.   No murmur heard. Pulmonary/Chest: Effort normal and breath sounds normal. No respiratory distress. She has no wheezes. She has no rales.  Abdominal: Soft. Bowel sounds are normal. She exhibits no distension. There is no tenderness. There is no rebound and no guarding.  Musculoskeletal: Normal range of motion. She exhibits no edema and no tenderness.  Neurological: She is alert and oriented to person, place, and time. No cranial nerve deficit. She exhibits normal muscle tone. Coordination normal.  Skin: No rash noted.  Psychiatric: She has a normal mood and affect.       Assessment & Plan:

## 2011-09-18 NOTE — Assessment & Plan Note (Signed)
Improved in postpartum period even without iron supplementation. Plan to obtain CBC and perhaps iron/anemia panel once patient obtains insurance coverage/orange card. Advised she restart iron supplementation.

## 2011-09-18 NOTE — Patient Instructions (Addendum)
I'm not sure why you break out with sunlight. Try using sunscreen when you go outside. You can take antihistamine like zyrtec, claritin, allegra for itching. Make an appointment in 2-3 months for blood work.  Iron Deficiency Anemia There are many types of anemia. Iron deficiency anemia is the most common. Iron deficiency anemia is a decrease in the number of red blood cells caused by too little iron. Without enough iron, your body does not produce enough hemoglobin. Hemoglobin is a substance in red blood cells that carries oxygen to the body's tissues. Iron deficiency anemia may leave you tired and short of breath. CAUSES   Lack of iron in the diet.   This may be seen in infants and children, because there is little iron in milk.   This may be seen in adults who do not eat enough iron-rich foods.   This may be seen in pregnant or breastfeeding women who do not take iron supplements. There is a much higher need for iron intake at these times.   Poor absorption of iron, as seen with intestinal disorders.   Intestinal bleeding.   Heavy periods.  SYMPTOMS  Mild anemia may not be noticeable. Symptoms may include:  Fatigue.   Headache.   Pale skin.   Weakness.   Shortness of breath.   Dizziness.   Cold hands and feet.   Fast or irregular heartbeat.  DIAGNOSIS  Diagnosis requires a thorough evaluation and physical exam by your caregiver.  Blood tests are generally used to confirm iron deficiency anemia.   Additional tests may be done to find the underlying cause of your anemia. These may include:   Testing for blood in the stool (fecal occult blood test).   A procedure to see inside the colon and rectum (colonoscopy).   A procedure to see inside the esophagus and stomach (endoscopy).  TREATMENT   Correcting the cause of the iron deficiency is the first step.   Medicines, such as oral contraceptives, can make heavy menstrual flows lighter.   Antibiotics and other  medicines can be used to treat peptic ulcers.   Surgery may be needed to remove a bleeding polyp, tumor, or fibroid.   Often, iron supplements (ferrous sulfate) are taken.   For the best iron absorption, take these supplements with an empty stomach.   You may need to take the supplements with food if you cannot tolerate them on an empty stomach. Vitamin C improves the absorption of iron. Your caregiver may recommend taking your iron tablets with a glass of orange juice or vitamin C supplement.   Milk and antacids should not be taken at the same time as iron supplements. They may interfere with the absorption of iron.   Iron supplements can cause constipation. A stool softener is often recommended.   Pregnant and breastfeeding women will need to take extra iron, because their normal diet usually will not provide the required amount.   Patients who cannot tolerate iron by mouth can take it through a vein (intravenously) or by an injection into the muscle.  HOME CARE INSTRUCTIONS   Ask your dietitian for help with diet questions.   Take iron and vitamins as directed by your caregiver.   Eat a diet rich in iron. Eat liver, lean beef, whole-grain bread, eggs, dried fruit, and dark green leafy vegetables.  SEEK IMMEDIATE MEDICAL CARE IF:   You have a fainting episode. Do not drive yourself. Call your local emergency services (911 in [place] if no other  help is available.   You have chest pain, nausea, or vomiting.   You develop severe or increased shortness of breath with activities.   You develop weakness or increased thirst.   You have a rapid heartbeat.   You develop unexplained sweating or become lightheaded when getting up from a chair or bed.  MAKE SURE YOU:   Understand these instructions.   Will watch your condition.   Will get help right away if you are not doing well or get worse.  Document Released: 03/01/2000 Document Revised: 02/21/2011 Document Reviewed:  07/11/2009 [facility] Patient Information 2012 [place], [place].

## 2011-09-18 NOTE — Assessment & Plan Note (Signed)
No symptoms today. Unsure if this is hives or simple sun burn. Advised SPF daily, may use antihistamine for itch. Follow up if not improved or worsening.

## 2011-09-18 NOTE — Assessment & Plan Note (Signed)
Discussed options, risks/benefits. Maybe a good candidate for implanon or mirena once she gets medicaid in place. She cannot afford depo currently. She will start OCPs again, discussed possible side effect being elevated blood pressure. She agrees to check and follow up if elevated > 140/90.

## 2011-11-03 ENCOUNTER — Encounter (HOSPITAL_COMMUNITY): Payer: Self-pay | Admitting: Obstetrics and Gynecology

## 2011-11-03 ENCOUNTER — Inpatient Hospital Stay (HOSPITAL_COMMUNITY)
Admission: AD | Admit: 2011-11-03 | Discharge: 2011-11-03 | Disposition: A | Discharge status: Home / Self Care | Payer: Medicaid Other | Source: Ambulatory Visit | Attending: Obstetrics and Gynecology | Admitting: Obstetrics and Gynecology

## 2011-11-03 DIAGNOSIS — N3090 Cystitis, unspecified without hematuria: Secondary | ICD-10-CM | POA: Insufficient documentation

## 2011-11-03 DIAGNOSIS — R319 Hematuria, unspecified: Secondary | ICD-10-CM | POA: Insufficient documentation

## 2011-11-03 DIAGNOSIS — N3091 Cystitis, unspecified with hematuria: Secondary | ICD-10-CM

## 2011-11-03 LAB — URINALYSIS, ROUTINE W REFLEX MICROSCOPIC
Bilirubin Urine: NEGATIVE
Glucose, UA: NEGATIVE mg/dL
Ketones, ur: 15 mg/dL — AB
Nitrite: POSITIVE — AB
Protein, ur: 100 mg/dL — AB
Specific Gravity, Urine: 1.025 (ref 1.005–1.030)
Urobilinogen, UA: 1 mg/dL (ref 0.0–1.0)
pH: 5.5 (ref 5.0–8.0)

## 2011-11-03 LAB — URINE MICROSCOPIC-ADD ON

## 2011-11-03 LAB — POCT PREGNANCY, URINE: Preg Test, Ur: NEGATIVE

## 2011-11-03 MED ORDER — SULFAMETHOXAZOLE-TRIMETHOPRIM 800-160 MG PO TABS: 1.0000 | ORAL_TABLET | Freq: Two times a day (BID) | ORAL | Status: AC

## 2011-11-03 NOTE — MAU Note (Signed)
Pt reports haivng blood in her urine for several weeks. Now more is coming out when she urinates and c/o  Some pain when she urinates and occational right flank /back pain.

## 2011-11-03 NOTE — MAU Provider Note (Signed)
History     CSN: [id number]  Arrival date & time 11/03/11  1118   None     Chief Complaint  Patient presents with  . Vaginal Bleeding    (Consider location/radiation/quality/duration/timing/severity/associated sxs/prior treatment) HPI [name]  Is a 21 y.o. G1P1001. She c/o blood in her urine x2 wks, getting brighter, sm clots now. Has frequency, urgency, no burning or dysuria.  Had some vomiting this am, no other c/o.  Past Medical History  Diagnosis Date  . Anemia   . Scoliosis   . Heart murmur   . Late prenatal care   . Gestational hypertension     Past Surgical History  Procedure Date  . No past surgeries     Family History  Problem Relation Age of Onset  . Anemia Mother   . Migraines Mother   . Mitral valve prolapse Sister   . Heart murmur Brother   . Heart attack Maternal Grandmother   . Mitral valve prolapse Maternal Grandmother   . Heart disease Maternal Grandmother   . Diabetes Maternal Grandmother   . Hyperlipidemia Maternal Grandmother   . Hypertension Maternal Grandmother     History  Substance Use Topics  . Smoking status: Never Smoker   . Smokeless tobacco: Never Used  . Alcohol Use: No    OB History    Grav Para Term Preterm Abortions TAB SAB Ect Mult Living   1 1 1  0 0 0 0 0 0 1      Review of Systems  Constitutional: Negative for fever and chills.  Genitourinary: Positive for urgency and frequency. Negative for dysuria, flank pain, vaginal bleeding and vaginal discharge.    Allergies  Review of patient's allergies indicates no known allergies.  Home Medications  No current outpatient prescriptions on file.  BP 116/86  Pulse 73  Temp 98.4 F (36.9 C) (Oral)  Resp 18  Ht 5\' 7"  (1.702 m)  Wt 148 lb 3.2 oz (67.223 kg)  BMI 23.21 kg/m2  Breastfeeding? Unknown  Physical Exam  Constitutional: She is oriented to person, place, and time. She appears well-developed and well-nourished.  Abdominal: Soft. There is no tenderness.  There is no guarding.  Genitourinary:       Neg CVA tenderness  Musculoskeletal: Normal range of motion.  Neurological: She is alert and oriented to person, place, and time.  Skin: Skin is warm and dry.  Psychiatric: She has a normal mood and affect. Her behavior is normal.    ED Course  Procedures (including critical care time)  Labs Reviewed  URINALYSIS, ROUTINE W REFLEX MICROSCOPIC - Abnormal; Notable for the following:    Color, Urine RED (*)  BIOCHEMICALS MAY BE AFFECTED BY COLOR   APPearance CLOUDY (*)     Hgb urine dipstick LARGE (*)     Ketones, ur 15 (*)     Protein, ur 100 (*)     Nitrite POSITIVE (*)     Leukocytes, UA SMALL (*)     All other components within normal limits  URINE MICROSCOPIC-ADD ON - Abnormal; Notable for the following:    Bacteria, UA FEW (*)     All other components within normal limits  POCT PREGNANCY, URINE   No results found. ASSESSMENT:  Hemorrhagic cystitis  No diagnosis found.  PLAN:  Bactrim DS BID x 7d F/u PCP- [facility]  MDM

## 2011-11-12 NOTE — MAU Provider Note (Signed)
Attestation of Attending Supervision of Advanced Practitioner: Evaluation and management procedures were performed by the PA/NP/CNM/OB Fellow under my supervision/collaboration. Chart reviewed and agree with management and plan.  Murielle Stang V 11/12/2011 7:01 PM    

## 2011-12-24 ENCOUNTER — Ambulatory Visit (INDEPENDENT_AMBULATORY_CARE_PROVIDER_SITE_OTHER): Payer: Medicaid Other | Admitting: Family Medicine

## 2011-12-24 ENCOUNTER — Encounter: Payer: Self-pay | Admitting: Family Medicine

## 2011-12-24 ENCOUNTER — Telehealth: Payer: Self-pay | Admitting: Family Medicine

## 2011-12-24 VITALS — BP 140/78 | HR 79 | Temp 98.5°F | Ht 67.0 in | Wt 154.5 lb

## 2011-12-24 DIAGNOSIS — N390 Urinary tract infection, site not specified: Secondary | ICD-10-CM | POA: Insufficient documentation

## 2011-12-24 DIAGNOSIS — N3090 Cystitis, unspecified without hematuria: Secondary | ICD-10-CM

## 2011-12-24 LAB — POCT URINALYSIS DIPSTICK
Bilirubin, UA: NEGATIVE
Blood, UA: NEGATIVE
Glucose, UA: NEGATIVE
Ketones, UA: NEGATIVE
Nitrite, UA: NEGATIVE
Spec Grav, UA: 1.025
Urobilinogen, UA: 0.2
pH, UA: 7

## 2011-12-24 LAB — POCT UA - MICROSCOPIC ONLY: WBC, Ur, HPF, POC: >20

## 2011-12-24 MED ORDER — CIPROFLOXACIN HCL 500 MG PO TABS: 500.0000 mg | ORAL_TABLET | Freq: Two times a day (BID) | ORAL | Status: DC

## 2011-12-24 NOTE — Assessment & Plan Note (Signed)
Patient with an untreated UTI greater than 6 weeks. No systemic signs of pyelonephritis currently. I have prescribed ciprofloxacin empirically and will send urine for culture. Advised patient that if she is unable to obtain antibiotic, to call the office immediately. Advised patient to followup in one week or sooner if symptoms not improving. Discussed risks of untreated infection with the patient. We'll need to follow up urinalysis in 2-3 weeks to ensure clearance of hematuria.

## 2011-12-24 NOTE — Progress Notes (Signed)
  Subjective:    Patient ID: [name], female    DOB: 02-03-1991, 21 y.o.   MRN: [id number]  HPI  1. F/u hemorrhagic cystitis. Patient was seen at [facility] 6 weeks ago for blood in her urine, diagnosed with hemorrhagic cystitis and prescribed Bactrim. Patient states she could not fill the medication because Medicaid denied it. Instead she does pick up some azo from over the counter and used that with mild improvement of symptoms. Currently the blood in her urine has resolved, but the patient felt some mild right lower quadrant discomfort and mild low back/suprapubic pains for past few weeks.   She felt warm last night, though denies any fevers, chills, vaginal discharge, diarrhea, intractable emesis, vaginal bleeding, hesitancy, frequency.  Review of Systems See HPI otherwise negative.  reports that she has never smoked. She has never used smokeless tobacco.     Objective:   Physical Exam  Vitals reviewed. Constitutional: She is oriented to person, place, and time. She appears well-developed and well-nourished. No distress.  HENT:  Head: Normocephalic and atraumatic.  Eyes: EOM are normal. Pupils are equal, round, and reactive to light.  Cardiovascular: Normal rate, regular rhythm and normal heart sounds.   No murmur heard. Pulmonary/Chest: Effort normal and breath sounds normal. No respiratory distress. She has no wheezes. She has no rales.  Abdominal: Soft. Bowel sounds are normal. She exhibits no distension. There is tenderness. There is no rebound and no guarding.       Mild suprapubic and periumbilical TTP.   Musculoskeletal: She exhibits no edema and no tenderness.  Neurological: She is alert and oriented to person, place, and time.  Skin: No rash noted. She is not diaphoretic.  Psychiatric: She has a normal mood and affect.       Assessment & Plan:

## 2011-12-24 NOTE — Telephone Encounter (Signed)
Pt states that medicaid will not pay for the Cipro and needs something different [facility] [address]

## 2011-12-24 NOTE — Patient Instructions (Addendum)
You seem to have a urinary tract infection still. Start taking ciprofloxacin. If you cannot get this medicine, call the doctor. Make an appointment for check up in 1 week.  If you have worsening symptoms, cannot keep down med or fluids, or do not improve, come back sooner.  Urinary Tract Infection Urinary tract infections (UTIs) can develop anywhere along your urinary tract. Your urinary tract is your body's drainage system for removing wastes and extra water. Your urinary tract includes two kidneys, two ureters, a bladder, and a urethra. Your kidneys are a pair of bean-shaped organs. Each kidney is about the size of your fist. They are located below your ribs, one on each side of your spine. CAUSES Infections are caused by microbes, which are microscopic organisms, including fungi, viruses, and bacteria. These organisms are so small that they can only be seen through a microscope. Bacteria are the microbes that most commonly cause UTIs. SYMPTOMS  Symptoms of UTIs may vary by age and gender of the patient and by the location of the infection. Symptoms in young women typically include a frequent and intense urge to urinate and a painful, burning feeling in the bladder or urethra during urination. Older women and men are more likely to be tired, shaky, and weak and have muscle aches and abdominal pain. A fever may mean the infection is in your kidneys. Other symptoms of a kidney infection include pain in your back or sides below the ribs, nausea, and vomiting. DIAGNOSIS To diagnose a UTI, your caregiver will ask you about your symptoms. Your caregiver also will ask to provide a urine sample. The urine sample will be tested for bacteria and white blood cells. White blood cells are made by your body to help fight infection. TREATMENT  Typically, UTIs can be treated with medication. Because most UTIs are caused by a bacterial infection, they usually can be treated with the use of antibiotics. The choice of  antibiotic and length of treatment depend on your symptoms and the type of bacteria causing your infection. HOME CARE INSTRUCTIONS  If you were prescribed antibiotics, take them exactly as your caregiver instructs you. Finish the medication even if you feel better after you have only taken some of the medication.  Drink enough water and fluids to keep your urine clear or pale yellow.  Avoid caffeine, tea, and carbonated beverages. They tend to irritate your bladder.  Empty your bladder often. Avoid holding urine for long periods of time.  Empty your bladder before and after sexual intercourse.  After a bowel movement, women should cleanse from front to back. Use each tissue only once. SEEK MEDICAL CARE IF:   You have back pain.  You develop a fever.  Your symptoms do not begin to resolve within 3 days. SEEK IMMEDIATE MEDICAL CARE IF:   You have severe back pain or lower abdominal pain.  You develop chills.  You have nausea or vomiting.  You have continued burning or discomfort with urination. MAKE SURE YOU:   Understand these instructions.  Will watch your condition.  Will get help right away if you are not doing well or get worse. Document Released: 12/12/2004 Document Revised: 09/03/2011 Document Reviewed: 04/12/2011 [facility] Patient Information 2013 [place], [place].

## 2011-12-24 NOTE — Telephone Encounter (Signed)
Spoke with pharmacy. They state that medicaid denied because claimed it has been picked up already elsewhere. Told pharmacy this was not possible. rx only sent to them. She will contact medicaid for clarification.

## 2011-12-25 NOTE — Telephone Encounter (Signed)
Called the pharmacy to see if pt was able to get her Abx. She has not due to it not being covered.[name] [place]

## 2011-12-25 NOTE — Telephone Encounter (Signed)
Can you call the pharmacy to make sure [place] was able to get antibiotic?

## 2011-12-26 LAB — URINE CULTURE: Colony Count: 15000

## 2011-12-26 NOTE — Telephone Encounter (Signed)
Spoke with pharmacy again. They state she only has pregnancy medicaid on file, so they won't cover antibiotics. The patient says she has regular medicaid now. Can you clarify this with the patient? She may need to call the medicaid office.

## 2011-12-27 NOTE — Telephone Encounter (Signed)
Called and lvm instructing her that she will need to contact her social worker to find out what is going on concerning whether or not she has pregnancy medicaid or regular medicaid this is something that we cannot do for her. She does have the option of paying out of pocket for the meds.[name] [place]

## 2011-12-30 ENCOUNTER — Other Ambulatory Visit: Payer: Self-pay | Admitting: Family Medicine

## 2011-12-30 MED ORDER — CEPHALEXIN 500 MG PO TABS: 500.0000 mg | ORAL_TABLET | Freq: Three times a day (TID) | ORAL | Status: DC

## 2012-03-25 ENCOUNTER — Encounter: Payer: Medicaid Other | Admitting: Obstetrics and Gynecology

## 2012-03-28 ENCOUNTER — Inpatient Hospital Stay (HOSPITAL_COMMUNITY)
Admission: AD | Admit: 2012-03-28 | Discharge: 2012-03-28 | Disposition: A | Discharge status: Home / Self Care | Payer: Medicaid Other | Source: Ambulatory Visit | Attending: Obstetrics & Gynecology | Admitting: Obstetrics & Gynecology

## 2012-03-28 DIAGNOSIS — Z3201 Encounter for pregnancy test, result positive: Secondary | ICD-10-CM | POA: Insufficient documentation

## 2012-03-28 DIAGNOSIS — N912 Amenorrhea, unspecified: Secondary | ICD-10-CM

## 2012-03-28 NOTE — MAU Note (Signed)
Took home preg test and said positive. Just came in to make sure I'm pregnant. No problems otherwise

## 2012-03-28 NOTE — MAU Note (Signed)
Raynelle Fanning PA in Triage with pt. Fundal ht about 20wks and FHTs dopplered by PA.

## 2012-03-28 NOTE — MAU Provider Note (Signed)
  History     CSN: [id number]  Arrival date and time: 03/28/12 0044   None     Chief Complaint  Patient presents with  . Possible Pregnancy   HPI [name] is a 22 y.o. G3P1001 female who presents today for pregnancy confirmation. The patient denies any problems today. She has not had a period since her last baby was born in January 2013. She has not been on any birth control. She is not currently breast feeding.   OB History    Grav Para Term Preterm Abortions TAB SAB Ect Mult Living   1 1 1  0 0 0 0 0 0 1      Past Medical History  Diagnosis Date  . Anemia   . Scoliosis   . Heart murmur   . Late prenatal care   . Gestational hypertension     Past Surgical History  Procedure Date  . No past surgeries     Family History  Problem Relation Age of Onset  . Anemia Mother   . Migraines Mother   . Mitral valve prolapse Sister   . Heart murmur Brother   . Heart attack Maternal Grandmother   . Mitral valve prolapse Maternal Grandmother   . Heart disease Maternal Grandmother   . Diabetes Maternal Grandmother   . Hyperlipidemia Maternal Grandmother   . Hypertension Maternal Grandmother     History  Substance Use Topics  . Smoking status: Never Smoker   . Smokeless tobacco: Never Used  . Alcohol Use: No    Allergies: No Known Allergies  Prescriptions prior to admission  Medication Sig Dispense Refill  . Cephalexin 500 MG tablet Take 1 tablet (500 mg total) by mouth 3 (three) times daily.  21 tablet  0  . ciprofloxacin (CIPRO) 500 MG tablet Take 1 tablet (500 mg total) by mouth 2 (two) times daily.  20 tablet  0  . norgestimate-ethinyl estradiol (ORTHO-CYCLEN,SPRINTEC,PREVIFEM) 0.25-35 MG-MCG tablet Take 1 tablet by mouth daily.  1 Package  3    ROS All negative unless otherwise noted in HPI Physical Exam   Blood pressure 128/76, pulse 80, temperature 97.8 F (36.6 C), resp. rate 18, height 5\' 7"  (1.702 m), weight 145 lb 9.6 oz (66.044 kg), not  currently breastfeeding.  Physical Exam  Constitutional: She is oriented to person, place, and time. She appears well-developed and well-nourished. No distress.  HENT:  Head: Normocephalic and atraumatic.  Cardiovascular: Normal rate.   Respiratory: Effort normal.  GI: Soft. She exhibits no distension and no mass. There is no tenderness. There is no rebound and no guarding.  Genitourinary: Uterus is enlarged (uterus palpated to the umbilicus.  ). Uterus is not tender.  Neurological: She is alert and oriented to person, place, and time.  Skin: Skin is warm and dry. No erythema.  Psychiatric: She has a normal mood and affect.   FHT - 155 bpm  MAU Course  Procedures None  Assessment and Plan  A: Pregnant   P: Discharge home Pregnancy confirmation letter given Patient will start prenatal care with [facility] as soon as possible Patient may return to MAU as needed   Freddi Starr, PA-C 03/28/2012, 1:14 AM

## 2012-03-30 ENCOUNTER — Other Ambulatory Visit: Payer: Self-pay | Admitting: Family Medicine

## 2012-03-30 ENCOUNTER — Telehealth: Payer: Self-pay | Admitting: Family Medicine

## 2012-03-30 DIAGNOSIS — Z3490 Encounter for supervision of normal pregnancy, unspecified, unspecified trimester: Secondary | ICD-10-CM

## 2012-03-30 LAB — POCT PREGNANCY, URINE: Preg Test, Ur: POSITIVE — AB

## 2012-03-30 NOTE — Telephone Encounter (Signed)
Called and spoke with [place].  Patient currently only has [employer].  Advised patient that she will need to apply for pregnancy medicaid first before we can schedule her prenatal appointment.  Patient states she can't apply for pregnancy medicaid until she has a due date. She has not had a menstrual cycle since delivering her last child.  Spoke with Dr. Cristal Ford.  Dating ultrasound ordered.  Advised patient to go have u/s on 04/02/2012 @ 8:15am at [facility].  They should be able to give her documentation of her due date.  Once she has applied for Medicaid and has documentation of approval, she can call us back and we will get her NOB appointments scheduled.  Patient states understanding.  Ileana Ladd

## 2012-03-30 NOTE — Addendum Note (Signed)
Addended by: Damita Lack on: 03/30/2012 02:26 PM   Modules accepted: Orders

## 2012-03-30 NOTE — Telephone Encounter (Signed)
Patient is calling to schedule her first OB appt.  Pregnancy was confirmed at [facility] on 1/11.

## 2012-04-02 ENCOUNTER — Ambulatory Visit (HOSPITAL_COMMUNITY)
Admission: RE | Admit: 2012-04-02 | Discharge: 2012-04-02 | Disposition: A | Discharge status: Home / Self Care | Payer: Medicaid Other | Source: Ambulatory Visit | Attending: Family Medicine | Admitting: Family Medicine

## 2012-04-02 ENCOUNTER — Other Ambulatory Visit: Payer: Self-pay | Admitting: Family Medicine

## 2012-04-02 ENCOUNTER — Encounter (HOSPITAL_COMMUNITY): Payer: Self-pay

## 2012-04-02 ENCOUNTER — Telehealth: Payer: Self-pay | Admitting: Family Medicine

## 2012-04-02 DIAGNOSIS — Z3490 Encounter for supervision of normal pregnancy, unspecified, unspecified trimester: Secondary | ICD-10-CM

## 2012-04-02 DIAGNOSIS — O358XX0 Maternal care for other (suspected) fetal abnormality and damage, not applicable or unspecified: Secondary | ICD-10-CM | POA: Insufficient documentation

## 2012-04-02 DIAGNOSIS — Z363 Encounter for antenatal screening for malformations: Secondary | ICD-10-CM | POA: Insufficient documentation

## 2012-04-02 DIAGNOSIS — Z1389 Encounter for screening for other disorder: Secondary | ICD-10-CM | POA: Insufficient documentation

## 2012-04-02 DIAGNOSIS — O09299 Supervision of pregnancy with other poor reproductive or obstetric history, unspecified trimester: Secondary | ICD-10-CM | POA: Insufficient documentation

## 2012-04-02 NOTE — Telephone Encounter (Signed)
Patient is calling to schedule a NOB appt.

## 2012-04-02 NOTE — Progress Notes (Signed)
Received a call from [facility].  [name] is there for her ultrasound and they feel she may have a possible heterotropic pregnancy and are asking for orders to do a KUB.  Orders entered in epic.  [name]

## 2012-04-03 NOTE — Telephone Encounter (Signed)
Returned [name]'s call to discuss NOB appointments.  Patient states she can not apply for medicaid until she gets a verification of pregnancy letter.  I advised her I could write her a note since she had a ultrasound done yesterday at [facility] which gives [place] a due date.  I also advised it is our policy not to schedule Lab and NOB appointments until we have documentation she has applied for Medicaid.  [place] states she can pick up note on Monday 1/20.  Informed her once she has documentation of Medicaid to call me back and we will schedule her NOB appointments.  Note written and placed up front for patient to pick up.  Ileana Ladd

## 2012-05-19 ENCOUNTER — Encounter (HOSPITAL_COMMUNITY): Payer: Self-pay | Admitting: Obstetrics and Gynecology

## 2012-05-19 ENCOUNTER — Inpatient Hospital Stay (HOSPITAL_COMMUNITY)
Admission: AD | Admit: 2012-05-19 | Discharge: 2012-05-19 | Disposition: A | Discharge status: Home / Self Care | Payer: Medicaid Other | Source: Ambulatory Visit | Attending: Obstetrics & Gynecology | Admitting: Obstetrics & Gynecology

## 2012-05-19 DIAGNOSIS — O4700 False labor before 37 completed weeks of gestation, unspecified trimester: Secondary | ICD-10-CM | POA: Insufficient documentation

## 2012-05-19 DIAGNOSIS — R42 Dizziness and giddiness: Secondary | ICD-10-CM

## 2012-05-19 DIAGNOSIS — O99891 Other specified diseases and conditions complicating pregnancy: Secondary | ICD-10-CM | POA: Insufficient documentation

## 2012-05-19 DIAGNOSIS — R002 Palpitations: Secondary | ICD-10-CM | POA: Insufficient documentation

## 2012-05-19 LAB — URINE MICROSCOPIC-ADD ON

## 2012-05-19 LAB — URINALYSIS, ROUTINE W REFLEX MICROSCOPIC
Bilirubin Urine: NEGATIVE
Glucose, UA: NEGATIVE mg/dL
Hgb urine dipstick: NEGATIVE
Ketones, ur: NEGATIVE mg/dL
Nitrite: NEGATIVE
Protein, ur: NEGATIVE mg/dL
Specific Gravity, Urine: 1.01 (ref 1.005–1.030)
Urobilinogen, UA: 0.2 mg/dL (ref 0.0–1.0)
pH: 6 (ref 5.0–8.0)

## 2012-05-19 LAB — FETAL FIBRONECTIN: Fetal Fibronectin: NEGATIVE

## 2012-05-19 NOTE — MAU Provider Note (Signed)
History     CSN: [id number]  Arrival date and time: 05/19/12 1803   None     Chief Complaint  Patient presents with  . Dizziness   HPI This is a 22 y.o. female at [redacted]w[redacted]d who presents with c/o dizziness and heart palpitations.  States they occur together.  Has not seen Dr Cristal Ford yet due to [facility] not being done.   Denies leaking or bleeding.  Had some abd discomfort yesterday but none today. Heart palp were also yesterday before self-resolving.  RN Note: When she stands up or sits down she feels dizzy and her heart starts beating fast  OB History   Grav Para Term Preterm Abortions TAB SAB Ect Mult Living   2 1 1  0 0 0 0 0 0 1      Past Medical History  Diagnosis Date  . Anemia   . Scoliosis   . Heart murmur   . Late prenatal care   . Gestational hypertension     Past Surgical History  Procedure Laterality Date  . No past surgeries      Family History  Problem Relation Age of Onset  . Anemia Mother   . Migraines Mother   . Mitral valve prolapse Sister   . Heart murmur Brother   . Heart attack Maternal Grandmother   . Mitral valve prolapse Maternal Grandmother   . Heart disease Maternal Grandmother   . Diabetes Maternal Grandmother   . Hyperlipidemia Maternal Grandmother   . Hypertension Maternal Grandmother     History  Substance Use Topics  . Smoking status: Never Smoker   . Smokeless tobacco: Never Used  . Alcohol Use: No    Allergies: No Known Allergies  Prescriptions prior to admission  Medication Sig Dispense Refill  . prenatal vitamin w/FE, FA (NATACHEW) 29-1 MG CHEW Chew 1 tablet by mouth daily at 12 noon.        Review of Systems  Constitutional: Negative for fever, chills and malaise/fatigue.  Cardiovascular: Negative for chest pain.  Gastrointestinal: Negative for nausea, vomiting, abdominal pain, diarrhea and constipation.  Genitourinary: Negative for dysuria.  Neurological: Positive for dizziness. Negative for weakness.   Physical  Exam   Blood pressure 118/77, pulse 89, temperature 98.2 F (36.8 C), temperature source Oral, resp. rate 18, height 5\' 5"  (1.651 m), weight 155 lb (70.308 kg), SpO2 99.00%, unknown if currently breastfeeding.  Filed Vitals:   05/19/12 1844 05/19/12 1853 05/19/12 1855 05/19/12 1857  BP: 133/76 116/64 125/77 118/77  Pulse: 81 86 92 89  Temp:      TempSrc:      Resp: 18 18 18 18   Height:      Weight:      SpO2:        Physical Exam  Constitutional: She is oriented to person, place, and time. She appears well-developed and well-nourished. No distress.  HENT:  Head: Normocephalic.  Cardiovascular: Normal rate, regular rhythm and normal heart sounds.  Exam reveals no gallop and no friction rub.   No murmur heard. Respiratory: Effort normal and breath sounds normal.  GI: Soft. Bowel sounds are normal. She exhibits no distension. There is no tenderness.  Musculoskeletal: Normal range of motion. She exhibits no edema.  Neurological: She is alert and oriented to person, place, and time. She has normal reflexes. She exhibits normal muscle tone. Coordination normal.  Skin: Skin is warm and dry.  Psychiatric: She has a normal mood and affect.    MAU Course  Procedures  MDM EKG done.  Assessment and Plan  A:  Dizziness      Palpitations  P;  Awaiting results of EKG       Report to Pincus Badder CNM  [facility] 05/19/2012, 7:28 PM   Pt feeling well now  FHT 130s +accels, no decels Irritability q 4-7 mins  Cx closed/long  FFN neg Urinalysis    Component Value Date/Time   COLORURINE YELLOW 05/19/2012 1840   APPEARANCEUR HAZY* 05/19/2012 1840   LABSPEC 1.010 05/19/2012 1840   PHURINE 6.0 05/19/2012 1840   GLUCOSEU NEGATIVE 05/19/2012 1840   HGBUR NEGATIVE 05/19/2012 1840   BILIRUBINUR NEGATIVE 05/19/2012 1840   BILIRUBINUR NEG 12/24/2011 1034   KETONESUR NEGATIVE 05/19/2012 1840   PROTEINUR NEGATIVE 05/19/2012 1840   UROBILINOGEN 0.2 05/19/2012 1840   UROBILINOGEN 0.2 12/24/2011 1034    NITRITE NEGATIVE 05/19/2012 1840   NITRITE NEG 12/24/2011 1034   LEUKOCYTESUR SMALL* 05/19/2012 1840   EKG-  NSR with sinus arrhythmia (arrhythmia not seen by me)  A/P: IUP at 28.2wks Palpitations per pt Irritability without cx change and - FFN  D/C home with instructions to increase fluids and eliminate caffeine; also instructed in Valsalva  If palpitations persist may need cardio referral Note sent to Dr [name], [facility] 05/19/2012 9:17 PM

## 2012-05-19 NOTE — MAU Note (Signed)
Name and DOB verified, pt confirmed the spelling is correct on the ID band.

## 2012-05-19 NOTE — MAU Note (Signed)
When she stands up or sits down she feels dizzy and her heart starts beating fast.

## 2012-05-26 ENCOUNTER — Other Ambulatory Visit: Payer: Medicaid Other

## 2012-05-27 ENCOUNTER — Other Ambulatory Visit: Payer: Medicaid Other

## 2012-05-27 DIAGNOSIS — Z3201 Encounter for pregnancy test, result positive: Secondary | ICD-10-CM

## 2012-05-27 LAB — POCT URINALYSIS DIP (DEVICE)
Bilirubin Urine: NEGATIVE
Glucose, UA: NEGATIVE mg/dL
Hgb urine dipstick: NEGATIVE
Ketones, ur: NEGATIVE mg/dL
Nitrite: NEGATIVE
Protein, ur: NEGATIVE mg/dL
Specific Gravity, Urine: 1.02 (ref 1.005–1.030)
Urobilinogen, UA: 1 mg/dL (ref 0.0–1.0)
pH: 7 (ref 5.0–8.0)

## 2012-05-28 LAB — OBSTETRIC PANEL
Antibody Screen: NEGATIVE
Basophils Absolute: 0 10*3/uL (ref 0.0–0.1)
Basophils Relative: 0 % (ref 0–1)
Eosinophils Absolute: 0.1 10*3/uL (ref 0.0–0.7)
Eosinophils Relative: 1 % (ref 0–5)
HCT: 31.3 % — ABNORMAL LOW (ref 36.0–46.0)
Hemoglobin: 10.3 g/dL — ABNORMAL LOW (ref 12.0–15.0)
Hepatitis B Surface Ag: NEGATIVE
Lymphocytes Relative: 17 % (ref 12–46)
Lymphs Abs: 1.2 10*3/uL (ref 0.7–4.0)
MCH: 26.8 pg (ref 26.0–34.0)
MCHC: 32.9 g/dL (ref 30.0–36.0)
MCV: 81.3 fL (ref 78.0–100.0)
Monocytes Absolute: 0.5 10*3/uL (ref 0.1–1.0)
Monocytes Relative: 7 % (ref 3–12)
Neutro Abs: 5.6 10*3/uL (ref 1.7–7.7)
Neutrophils Relative %: 75 % (ref 43–77)
Platelets: 194 10*3/uL (ref 150–400)
RBC: 3.85 MIL/uL — ABNORMAL LOW (ref 3.87–5.11)
RDW: 14.9 % (ref 11.5–15.5)
Rh Type: POSITIVE
Rubella: 6.72 Index — ABNORMAL HIGH (ref <0.90)
WBC: 7.4 10*3/uL (ref 4.0–10.5)

## 2012-05-28 LAB — HIV ANTIBODY (ROUTINE TESTING W REFLEX): HIV: NONREACTIVE

## 2012-05-29 LAB — HEMOGLOBINOPATHY EVALUATION
Hemoglobin Other: 0 %
Hgb A2 Quant: 2.6 % (ref 2.2–3.2)
Hgb A: 97.4 % (ref 96.8–97.8)
Hgb F Quant: 0 % (ref 0.0–2.0)
Hgb S Quant: 0 %

## 2012-06-03 ENCOUNTER — Other Ambulatory Visit: Payer: Medicaid Other

## 2012-06-08 ENCOUNTER — Ambulatory Visit (INDEPENDENT_AMBULATORY_CARE_PROVIDER_SITE_OTHER): Payer: Medicaid Other | Admitting: Obstetrics & Gynecology

## 2012-06-08 ENCOUNTER — Other Ambulatory Visit: Payer: Self-pay | Admitting: Obstetrics & Gynecology

## 2012-06-08 ENCOUNTER — Other Ambulatory Visit (HOSPITAL_COMMUNITY)
Admission: RE | Admit: 2012-06-08 | Discharge: 2012-06-08 | Disposition: A | Discharge status: Home / Self Care | Payer: Medicaid Other | Source: Ambulatory Visit | Attending: Obstetrics & Gynecology | Admitting: Obstetrics & Gynecology

## 2012-06-08 ENCOUNTER — Encounter: Payer: Self-pay | Admitting: Obstetrics & Gynecology

## 2012-06-08 VITALS — BP 144/82 | Wt 156.1 lb

## 2012-06-08 DIAGNOSIS — O1493 Unspecified pre-eclampsia, third trimester: Secondary | ICD-10-CM

## 2012-06-08 DIAGNOSIS — O0993 Supervision of high risk pregnancy, unspecified, third trimester: Secondary | ICD-10-CM | POA: Insufficient documentation

## 2012-06-08 DIAGNOSIS — Z01419 Encounter for gynecological examination (general) (routine) without abnormal findings: Secondary | ICD-10-CM | POA: Insufficient documentation

## 2012-06-08 DIAGNOSIS — IMO0002 Reserved for concepts with insufficient information to code with codable children: Secondary | ICD-10-CM

## 2012-06-08 LAB — POCT URINALYSIS DIP (DEVICE)
Bilirubin Urine: NEGATIVE
Glucose, UA: NEGATIVE mg/dL
Ketones, ur: NEGATIVE mg/dL
Nitrite: NEGATIVE
Protein, ur: NEGATIVE mg/dL
Specific Gravity, Urine: 1.015 (ref 1.005–1.030)
Urobilinogen, UA: 0.2 mg/dL (ref 0.0–1.0)
pH: 7 (ref 5.0–8.0)

## 2012-06-08 LAB — GLUCOSE TOLERANCE, 1 HOUR (50G) W/O FASTING: Glucose, 1 Hour GTT: 134 mg/dL (ref 70–140)

## 2012-06-08 NOTE — Progress Notes (Signed)
Pulse: 86 1hr gtt today due at 2:40

## 2012-06-08 NOTE — Patient Instructions (Signed)
Pregnancy - Third Trimester  The third trimester of pregnancy (the last 3 months) is a period of the most rapid growth for you and your baby. The baby approaches a length of 20 inches and a weight of 6 to 10 pounds. The baby is adding on fat and getting ready for life outside your body. While inside, babies have periods of sleeping and waking, suck their thumbs, and hiccups. You can often feel small contractions of the uterus. This is false labor. It is also called Braxton-Hicks contractions. This is like a practice for labor. The usual problems in this stage of pregnancy include more difficulty breathing, swelling of the hands and feet from water retention, and having to urinate more often because of the uterus and baby pressing on your bladder.   PRENATAL EXAMS  · Blood work may continue to be done during prenatal exams. These tests are done to check on your health and the probable health of your baby. Blood work is used to follow your blood levels (hemoglobin). Anemia (low hemoglobin) is common during pregnancy. Iron and vitamins are given to help prevent this. You may also continue to be checked for diabetes. Some of the past blood tests may be done again.  · The size of the uterus is measured during each visit. This makes sure your baby is growing properly according to your pregnancy dates.  · Your blood pressure is checked every prenatal visit. This is to make sure you are not getting toxemia.  · Your urine is checked every prenatal visit for infection, diabetes and protein.  · Your weight is checked at each visit. This is done to make sure gains are happening at the suggested rate and that you and your baby are growing normally.  · Sometimes, an ultrasound is performed to confirm the position and the proper growth and development of the baby. This is a test done that bounces harmless sound waves off the baby so your caregiver can more accurately determine due dates.  · Discuss the type of pain medication and  anesthesia you will have during your labor and delivery.  · Discuss the possibility and anesthesia if a Cesarean Section might be necessary.  · Inform your caregiver if there is any mental or physical violence at home.  Sometimes, a specialized non-stress test, contraction stress test and biophysical profile are done to make sure the baby is not having a problem. Checking the amniotic fluid surrounding the baby is called an amniocentesis. The amniotic fluid is removed by sticking a needle into the belly (abdomen). This is sometimes done near the end of pregnancy if an early delivery is required. In this case, it is done to help make sure the baby's lungs are mature enough for the baby to live outside of the womb. If the lungs are not mature and it is unsafe to deliver the baby, an injection of cortisone medication is given to the mother 1 to 2 days before the delivery. This helps the baby's lungs mature and makes it safer to deliver the baby.  CHANGES OCCURING IN THE THIRD TRIMESTER OF PREGNANCY  Your body goes through many changes during pregnancy. They vary from person to person. Talk to your caregiver about changes you notice and are concerned about.  · During the last trimester, you have probably had an increase in your appetite. It is normal to have cravings for certain foods. This varies from person to person and pregnancy to pregnancy.  · You may begin to   get stretch marks on your hips, abdomen, and breasts. These are normal changes in the body during pregnancy. There are no exercises or medications to take which prevent this change.  · Constipation may be treated with a stool softener or adding bulk to your diet. Drinking lots of fluids, fiber in vegetables, fruits, and whole grains are helpful.  · Exercising is also helpful. If you have been very active up until your pregnancy, most of these activities can be continued during your pregnancy. If you have been less active, it is helpful to start an exercise  program such as walking. Consult your caregiver before starting exercise programs.  · Avoid all smoking, alcohol, un-prescribed drugs, herbs and "street drugs" during your pregnancy. These chemicals affect the formation and growth of the baby. Avoid chemicals throughout the pregnancy to ensure the delivery of a healthy infant.  · Backache, varicose veins and hemorrhoids may develop or get worse.  · You will tire more easily in the third trimester, which is normal.  · The baby's movements may be stronger and more often.  · You may become short of breath easily.  · Your belly button may stick out.  · A yellow discharge may leak from your breasts called colostrum.  · You may have a bloody mucus discharge. This usually occurs a few days to a week before labor begins.  HOME CARE INSTRUCTIONS   · Keep your caregiver's appointments. Follow your caregiver's instructions regarding medication use, exercise, and diet.  · During pregnancy, you are providing food for you and your baby. Continue to eat regular, well-balanced meals. Choose foods such as meat, fish, milk and other low fat dairy products, vegetables, fruits, and whole-grain breads and cereals. Your caregiver will tell you of the ideal weight gain.  · A physical sexual relationship may be continued throughout pregnancy if there are no other problems such as early (premature) leaking of amniotic fluid from the membranes, vaginal bleeding, or belly (abdominal) pain.  · Exercise regularly if there are no restrictions. Check with your caregiver if you are unsure of the safety of your exercises. Greater weight gain will occur in the last 2 trimesters of pregnancy. Exercising helps:  · Control your weight.  · Get you in shape for labor and delivery.  · You lose weight after you deliver.  · Rest a lot with legs elevated, or as needed for leg cramps or low back pain.  · Wear a good support or jogging bra for breast tenderness during pregnancy. This may help if worn during  sleep. Pads or tissues may be used in the bra if you are leaking colostrum.  · Do not use hot tubs, steam rooms, or saunas.  · Wear your seat belt when driving. This protects you and your baby if you are in an accident.  · Avoid raw meat, cat litter boxes and soil used by cats. These carry germs that can cause birth defects in the baby.  · It is easier to loose urine during pregnancy. Tightening up and strengthening the pelvic muscles will help with this problem. You can practice stopping your urination while you are going to the bathroom. These are the same muscles you need to strengthen. It is also the muscles you would use if you were trying to stop from passing gas. You can practice tightening these muscles up 10 times a set and repeating this about 3 times per day. Once you know what muscles to tighten up, do not perform these   exercises during urination. It is more likely to cause an infection by backing up the urine.  · Ask for help if you have financial, counseling or nutritional needs during pregnancy. Your caregiver will be able to offer counseling for these needs as well as refer you for other special needs.  · Make a list of emergency phone numbers and have them available.  · Plan on getting help from family or friends when you go home from the hospital.  · Make a trial run to the hospital.  · Take prenatal classes with the father to understand, practice and ask questions about the labor and delivery.  · Prepare the baby's room/nursery.  · Do not travel out of the city unless it is absolutely necessary and with the advice of your caregiver.  · Wear only low or no heal shoes to have better balance and prevent falling.  MEDICATIONS AND DRUG USE IN PREGNANCY  · Take prenatal vitamins as directed. The vitamin should contain 1 milligram of folic acid. Keep all vitamins out of reach of children. Only a couple vitamins or tablets containing iron may be fatal to a baby or young child when ingested.  · Avoid use  of all medications, including herbs, over-the-counter medications, not prescribed or suggested by your caregiver. Only take over-the-counter or prescription medicines for pain, discomfort, or fever as directed by your caregiver. Do not use aspirin, ibuprofen (Motrin®, Advil®, Nuprin®) or naproxen (Aleve®) unless OK'd by your caregiver.  · Let your caregiver also know about herbs you may be using.  · Alcohol is related to a number of birth defects. This includes fetal alcohol syndrome. All alcohol, in any form, should be avoided completely. Smoking will cause low birth rate and premature babies.  · Street/illegal drugs are very harmful to the baby. They are absolutely forbidden. A baby born to an addicted mother will be addicted at birth. The baby will go through the same withdrawal an adult does.  SEEK MEDICAL CARE IF:  You have any concerns or worries during your pregnancy. It is better to call with your questions if you feel they cannot wait, rather than worry about them.  DECISIONS ABOUT CIRCUMCISION  You may or may not know the sex of your baby. If you know your baby is a boy, it may be time to think about circumcision. Circumcision is the removal of the foreskin of the penis. This is the skin that covers the sensitive end of the penis. There is no proven medical need for this. Often this decision is made on what is popular at the time or based upon religious beliefs and social issues. You can discuss these issues with your caregiver or pediatrician.  SEEK IMMEDIATE MEDICAL CARE IF:   · An unexplained oral temperature above 102° F (38.9° C) develops, or as your caregiver suggests.  · You have leaking of fluid from the vagina (birth canal). If leaking membranes are suspected, take your temperature and tell your caregiver of this when you call.  · There is vaginal spotting, bleeding or passing clots. Tell your caregiver of the amount and how many pads are used.  · You develop a bad smelling vaginal discharge with  a change in the color from clear to white.  · You develop vomiting that lasts more than 24 hours.  · You develop chills or fever.  · You develop shortness of breath.  · You develop burning on urination.  · You loose more than 2 pounds of weight   or gain more than 2 pounds of weight or as suggested by your caregiver.  · You notice sudden swelling of your face, hands, and feet or legs.  · You develop belly (abdominal) pain. Round ligament discomfort is a common non-cancerous (benign) cause of abdominal pain in pregnancy. Your caregiver still must evaluate you.  · You develop a severe headache that does not go away.  · You develop visual problems, blurred or double vision.  · If you have not felt your baby move for more than 1 hour. If you think the baby is not moving as much as usual, eat something with sugar in it and lie down on your left side for an hour. The baby should move at least 4 to 5 times per hour. Call right away if your baby moves less than that.  · You fall, are in a car accident or any kind of trauma.  · There is mental or physical violence at home.  Document Released: 02/26/2001 Document Revised: 05/27/2011 Document Reviewed: 08/31/2008  [employer]® Patient Information ©2013 [employer].

## 2012-06-08 NOTE — Progress Notes (Signed)
   Subjective:first prenatal visit    [name] is a G2P1001 [redacted]w[redacted]d being seen today for her first obstetrical visit.  Her obstetrical history is significant for late prenatal care, borderline hypertension, h/o pre-eclampsia. Patient does intend to breast feed. Pregnancy history fully reviewed.  Patient reports occasional contractions.  Filed Vitals:   06/08/12 1335  BP: 144/82  Weight: 156 lb 1.6 oz (70.806 kg)    HISTORY: OB History   Grav Para Term Preterm Abortions TAB SAB Ect Mult Living   2 1 1  0 0 0 0 0 0 1     # Outc Date GA Lbr Len/2nd Wgt Sex Del Anes PTL Lv   1 TRM 1/13 [redacted]w[redacted]d 25:30 / 00:20 6lb4oz(2.835kg) F SVD Local  Yes   Comments: none   2 CUR              Past Medical History  Diagnosis Date  . Anemia   . Scoliosis   . Heart murmur   . Late prenatal care   . Gestational hypertension    Past Surgical History  Procedure Laterality Date  . No past surgeries     Family History  Problem Relation Age of Onset  . Anemia Mother   . Migraines Mother   . Mitral valve prolapse Sister   . Heart murmur Brother   . Heart attack Maternal Grandmother   . Mitral valve prolapse Maternal Grandmother   . Heart disease Maternal Grandmother   . Diabetes Maternal Grandmother   . Hyperlipidemia Maternal Grandmother   . Hypertension Maternal Grandmother      Exam    Uterus:     Pelvic Exam:    Perineum: No Hemorrhoids   Vulva: normal   Vagina:  normal mucosa   pH:    Cervix: no lesions   Adnexa: not evaluated   Bony Pelvis: average  System: Breast:  normal appearance, no masses or tenderness   Skin: normal coloration and turgor, no rashes    Neurologic: oriented, normal mood   Extremities: normal strength, tone, and muscle mass, no deformities   HEENT oropharynx clear, no lesions and neck supple with midline trachea   Mouth/Teeth dental hygiene good   Neck supple   Cardiovascular: regular rate and rhythm, no murmurs or gallops   Respiratory:  appears  well, vitals normal, no respiratory distress, acyanotic, normal RR, neck free of mass or lymphadenopathy, chest clear, no wheezing, crepitations, rhonchi, normal symmetric air entry   Abdomen: gravid c/w dates   Urinary: urethral meatus normal      Assessment:    Pregnancy: G2P1001 Patient Active Problem List  Diagnosis  . Hx of preeclampsia, prior pregnancy, currently pregnant  . Anemia  . 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 Supervision of high risk pregnancy in third trimester        Plan:     Initial labs drawn. Prenatal vitamins. Problem list reviewed and updated. Genetic Screening too late  Ultrasound discussed; fetal survey: results reviewed.  Follow up in 3 days 50% of 30 min visit spent on counseling and coordination of care.  PIH labs and RTC 3 days   ARNOLD,JAMES 06/08/2012

## 2012-06-09 LAB — GC/CHLAMYDIA PROBE AMP
CT Probe RNA: NEGATIVE
GC Probe RNA: NEGATIVE

## 2012-06-10 ENCOUNTER — Other Ambulatory Visit: Payer: Medicaid Other

## 2012-06-10 LAB — COMPREHENSIVE METABOLIC PANEL
ALT: 8 U/L (ref 0–35)
AST: 15 U/L (ref 0–37)
Albumin: 3.3 g/dL — ABNORMAL LOW (ref 3.5–5.2)
Alkaline Phosphatase: 79 U/L (ref 39–117)
BUN: 7 mg/dL (ref 6–23)
CO2: 23 mEq/L (ref 19–32)
Calcium: 8.7 mg/dL (ref 8.4–10.5)
Chloride: 104 mEq/L (ref 96–112)
Creat: 0.57 mg/dL (ref 0.50–1.10)
Glucose, Bld: 88 mg/dL (ref 70–99)
Potassium: 4.1 mEq/L (ref 3.5–5.3)
Sodium: 136 mEq/L (ref 135–145)
Total Bilirubin: 0.4 mg/dL (ref 0.3–1.2)
Total Protein: 6.4 g/dL (ref 6.0–8.3)

## 2012-06-10 LAB — CULTURE, OB URINE: Colony Count: >=100000

## 2012-06-11 LAB — CREATININE CLEARANCE, URINE, 24 HOUR
Creatinine Clearance: 96 mL/min (ref 75–115)
Creatinine, 24H Ur: 792 mg/d (ref 700–1800)
Creatinine, Urine: 79.2 mg/dL
Creatinine: 0.57 mg/dL (ref 0.50–1.10)

## 2012-06-11 LAB — PROTEIN, URINE, 24 HOUR
Protein, 24H Urine: 80 mg/d (ref 50–100)
Protein, Urine: 8 mg/dL

## 2012-06-13 ENCOUNTER — Encounter (HOSPITAL_COMMUNITY): Payer: Self-pay | Admitting: *Deleted

## 2012-06-13 ENCOUNTER — Inpatient Hospital Stay (HOSPITAL_COMMUNITY)
Admission: AD | Admit: 2012-06-13 | Discharge: 2012-06-13 | Disposition: A | Discharge status: Home / Self Care | Payer: Medicaid Other | Source: Ambulatory Visit | Attending: Obstetrics & Gynecology | Admitting: Obstetrics & Gynecology

## 2012-06-13 DIAGNOSIS — O99891 Other specified diseases and conditions complicating pregnancy: Secondary | ICD-10-CM | POA: Insufficient documentation

## 2012-06-13 DIAGNOSIS — O26893 Other specified pregnancy related conditions, third trimester: Secondary | ICD-10-CM

## 2012-06-13 DIAGNOSIS — Y9301 Activity, walking, marching and hiking: Secondary | ICD-10-CM | POA: Insufficient documentation

## 2012-06-13 DIAGNOSIS — Y92009 Unspecified place in unspecified non-institutional (private) residence as the place of occurrence of the external cause: Secondary | ICD-10-CM | POA: Insufficient documentation

## 2012-06-13 DIAGNOSIS — R12 Heartburn: Secondary | ICD-10-CM | POA: Insufficient documentation

## 2012-06-13 DIAGNOSIS — R1013 Epigastric pain: Secondary | ICD-10-CM | POA: Insufficient documentation

## 2012-06-13 DIAGNOSIS — M545 Low back pain: Secondary | ICD-10-CM

## 2012-06-13 DIAGNOSIS — W010XXA Fall on same level from slipping, tripping and stumbling without subsequent striking against object, initial encounter: Secondary | ICD-10-CM | POA: Insufficient documentation

## 2012-06-13 DIAGNOSIS — S300XXA Contusion of lower back and pelvis, initial encounter: Secondary | ICD-10-CM

## 2012-06-13 LAB — URINE MICROSCOPIC-ADD ON

## 2012-06-13 LAB — URINALYSIS, ROUTINE W REFLEX MICROSCOPIC
Bilirubin Urine: NEGATIVE
Glucose, UA: NEGATIVE mg/dL
Hgb urine dipstick: NEGATIVE
Ketones, ur: NEGATIVE mg/dL
Nitrite: NEGATIVE
Protein, ur: NEGATIVE mg/dL
Specific Gravity, Urine: 1.02 (ref 1.005–1.030)
Urobilinogen, UA: 0.2 mg/dL (ref 0.0–1.0)
pH: 7.5 (ref 5.0–8.0)

## 2012-06-13 MED ORDER — CYCLOBENZAPRINE HCL 10 MG PO TABS: 10.0000 mg | ORAL_TABLET | Freq: Three times a day (TID) | ORAL | Status: DC | PRN

## 2012-06-13 MED ORDER — RANITIDINE HCL 150 MG PO TABS: 75.0000 mg | ORAL_TABLET | Freq: Two times a day (BID) | ORAL | Status: DC

## 2012-06-13 NOTE — MAU Provider Note (Signed)
History     CSN: [id number]  Arrival date and time: 06/13/12 0940   None     Chief Complaint  Patient presents with  . Fall   HPI 22 y.o. G2P1001 at [redacted]w[redacted]d with fall yesterday on bottom (18:00 pm). Slipped going down stairs and landed on left hip, on carpet.  No contractions, vaginal bleeding or loss of fluid.  Baby moving well.  C/o pain in L hip/buttock. Worse when sits or lies down and when touches buttock. 10/10 - not relieved by tylenol. No numbness or weakness. No radiation.   Also c/o epigastric pain and mild nausea. Has been having heartburn not relieved by TUMS.  Late prenatal care at [facility], hx preeclampsia last pregnancy. Borderline HTN, BP 144/82 at initial prenatal visit. 24-hour urine protein 80. LFTs and platelets normal.  OB History   Grav Para Term Preterm Abortions TAB SAB Ect Mult Living   2 1 1  0 0 0 0 0 0 1      Past Medical History  Diagnosis Date  . Anemia   . Scoliosis   . Heart murmur   . Late prenatal care   . Gestational hypertension     Past Surgical History  Procedure Laterality Date  . No past surgeries      Family History  Problem Relation Age of Onset  . Anemia Mother   . Migraines Mother   . Mitral valve prolapse Sister   . Heart murmur Brother   . Heart attack Maternal Grandmother   . Mitral valve prolapse Maternal Grandmother   . Heart disease Maternal Grandmother   . Diabetes Maternal Grandmother   . Hyperlipidemia Maternal Grandmother   . Hypertension Maternal Grandmother     History  Substance Use Topics  . Smoking status: Never Smoker   . Smokeless tobacco: Never Used  . Alcohol Use: No    Allergies: No Known Allergies  Prescriptions prior to admission  Medication Sig Dispense Refill  . calcium carbonate (TUMS) 500 MG chewable tablet Chew 1-2 tablets by mouth 2 (two) times daily as needed for heartburn.      Marland Kitchen ibuprofen (ADVIL,MOTRIN) 200 MG tablet Take 200 mg by mouth every 6 (six) hours as needed for pain.      .  prenatal vitamin w/FE, FA (NATACHEW) 29-1 MG CHEW Chew 2 tablets by mouth daily at 12 noon.         ROS Negative except as stated in HPI  Physical Exam   Blood pressure 119/75, pulse 94, temperature 97.9 F (36.6 C), temperature source Oral, resp. rate 18, height 5\' 5"  (1.651 m), weight 72.122 kg (159 lb).  Physical Exam  Constitutional: She is oriented to person, place, and time. She appears well-developed and well-nourished. No distress.  HENT:  Head: Normocephalic.  Eyes: Conjunctivae and EOM are normal.  Neck: Normal range of motion. Neck supple.  Cardiovascular: Normal rate, regular rhythm and normal heart sounds.   Respiratory: Breath sounds normal. No respiratory distress.  GI: Soft. Bowel sounds are normal. There is no tenderness. There is no rebound and no guarding.  Musculoskeletal: Normal range of motion. She exhibits no edema and no tenderness.  Tender left hip/buttock/sacral area. No visible bruising or swelling.  Neurological: She is alert and oriented to person, place, and time.  Skin: Skin is warm and dry.  Psychiatric: She has a normal mood and affect.    MAU Course  Procedures  FHTs:  145, mod var, accels present, no decels:  Category I  TOCO:  No ctx  Assessment and Plan  21 y.o. G2P1001 at [redacted]w[redacted]d with fall on buttock -  Fall, low-impact, no clinical signs of abruption, 18 hours ago - musculoskeletal buttock pain/contusion:  Tylenol, ice, flexeril for muscle spasm - BP controlled - IUP with reactive NST - Heartburn/nausea - ranitidine - Discharge home f/u 4/9 in [facility]OC. Discussed precautions regarding bleeding, contractions, decreased fetal movement.  Napoleon Form, MD  Napoleon Form 06/13/2012, 10:56 AM

## 2012-06-13 NOTE — MAU Provider Note (Signed)
Attestation of Attending Supervision of Obstetric Fellow: Evaluation and management procedures were performed by the Obstetric Fellow under my supervision and collaboration.  I have reviewed the Obstetric Fellow's note and chart, and I agree with the management and plan.  Deovion Batrez, MD, FACOG Attending Obstetrician & Gynecologist Faculty Practice, [facility]   

## 2012-06-13 NOTE — MAU Note (Signed)
Pt slipped and fell on her bottom yesterday. Still havening some pain and discomfort. Reports good fetal movement and denies vag bleeding or discharge.

## 2012-06-14 LAB — URINE CULTURE: Colony Count: 95000

## 2012-06-17 ENCOUNTER — Telehealth: Payer: Self-pay

## 2012-06-17 NOTE — Telephone Encounter (Signed)
Message copied by Faythe Casa on Wed Jun 17, 2012 11:28 AM ------      Message from: Adam Phenix      Created: Mon Jun 15, 2012  9:52 PM       LSIL pap, repeat in 12 months ------

## 2012-06-17 NOTE — Telephone Encounter (Signed)
Called pt and informed pt of pap results and that the provider would like for her to please schedule a rpt pap in 12 months.  Pt stated understanding and did not have any other questions.

## 2012-06-24 ENCOUNTER — Ambulatory Visit (INDEPENDENT_AMBULATORY_CARE_PROVIDER_SITE_OTHER): Payer: Medicaid Other | Admitting: Family

## 2012-06-24 VITALS — BP 126/78 | Temp 97.1°F | Wt 161.9 lb

## 2012-06-24 DIAGNOSIS — O09299 Supervision of pregnancy with other poor reproductive or obstetric history, unspecified trimester: Secondary | ICD-10-CM

## 2012-06-24 DIAGNOSIS — O0993 Supervision of high risk pregnancy, unspecified, third trimester: Secondary | ICD-10-CM

## 2012-06-24 LAB — POCT URINALYSIS DIP (DEVICE)
Bilirubin Urine: NEGATIVE
Glucose, UA: NEGATIVE mg/dL
Hgb urine dipstick: NEGATIVE
Ketones, ur: NEGATIVE mg/dL
Nitrite: NEGATIVE
Protein, ur: NEGATIVE mg/dL
Specific Gravity, Urine: 1.02 (ref 1.005–1.030)
Urobilinogen, UA: 0.2 mg/dL (ref 0.0–1.0)
pH: 7.5 (ref 5.0–8.0)

## 2012-06-24 NOTE — Progress Notes (Signed)
Reviewed 1 hr results; no questions or concerns.  Preterm labor precautions reviewed.

## 2012-07-08 ENCOUNTER — Ambulatory Visit (INDEPENDENT_AMBULATORY_CARE_PROVIDER_SITE_OTHER): Payer: Medicaid Other | Admitting: Obstetrics and Gynecology

## 2012-07-08 VITALS — BP 133/84 | Temp 96.8°F | Wt 164.4 lb

## 2012-07-08 DIAGNOSIS — O0930 Supervision of pregnancy with insufficient antenatal care, unspecified trimester: Secondary | ICD-10-CM

## 2012-07-08 DIAGNOSIS — O0933 Supervision of pregnancy with insufficient antenatal care, third trimester: Secondary | ICD-10-CM

## 2012-07-08 LAB — POCT URINALYSIS DIP (DEVICE)
Bilirubin Urine: NEGATIVE
Glucose, UA: NEGATIVE mg/dL
Hgb urine dipstick: NEGATIVE
Ketones, ur: NEGATIVE mg/dL
Nitrite: NEGATIVE
Protein, ur: 30 mg/dL — AB
Specific Gravity, Urine: 1.025 (ref 1.005–1.030)
Urobilinogen, UA: 0.2 mg/dL (ref 0.0–1.0)
pH: 7 (ref 5.0–8.0)

## 2012-07-08 NOTE — Progress Notes (Signed)
No preE sx>cont to watch BP. By abd exam, breech oblique today.n Plans breastfeed, circ,Nexplanon.

## 2012-07-08 NOTE — Patient Instructions (Addendum)
Pregnancy - Third Trimester  The third trimester of pregnancy (the last 3 months) is a period of the most rapid growth for you and your baby. The baby approaches a length of 20 inches and a weight of 6 to 10 pounds. The baby is adding on fat and getting ready for life outside your body. While inside, babies have periods of sleeping and waking, suck their thumbs, and hiccups. You can often feel small contractions of the uterus. This is false labor. It is also called Braxton-Hicks contractions. This is like a practice for labor. The usual problems in this stage of pregnancy include more difficulty breathing, swelling of the hands and feet from water retention, and having to urinate more often because of the uterus and baby pressing on your bladder.   PRENATAL EXAMS  · Blood work may continue to be done during prenatal exams. These tests are done to check on your health and the probable health of your baby. Blood work is used to follow your blood levels (hemoglobin). Anemia (low hemoglobin) is common during pregnancy. Iron and vitamins are given to help prevent this. You may also continue to be checked for diabetes. Some of the past blood tests may be done again.  · The size of the uterus is measured during each visit. This makes sure your baby is growing properly according to your pregnancy dates.  · Your blood pressure is checked every prenatal visit. This is to make sure you are not getting toxemia.  · Your urine is checked every prenatal visit for infection, diabetes and protein.  · Your weight is checked at each visit. This is done to make sure gains are happening at the suggested rate and that you and your baby are growing normally.  · Sometimes, an ultrasound is performed to confirm the position and the proper growth and development of the baby. This is a test done that bounces harmless sound waves off the baby so your caregiver can more accurately determine due dates.  · Discuss the type of pain medication and  anesthesia you will have during your labor and delivery.  · Discuss the possibility and anesthesia if a Cesarean Section might be necessary.  · Inform your caregiver if there is any mental or physical violence at home.  Sometimes, a specialized non-stress test, contraction stress test and biophysical profile are done to make sure the baby is not having a problem. Checking the amniotic fluid surrounding the baby is called an amniocentesis. The amniotic fluid is removed by sticking a needle into the belly (abdomen). This is sometimes done near the end of pregnancy if an early delivery is required. In this case, it is done to help make sure the baby's lungs are mature enough for the baby to live outside of the womb. If the lungs are not mature and it is unsafe to deliver the baby, an injection of cortisone medication is given to the mother 1 to 2 days before the delivery. This helps the baby's lungs mature and makes it safer to deliver the baby.  CHANGES OCCURING IN THE THIRD TRIMESTER OF PREGNANCY  Your body goes through many changes during pregnancy. They vary from person to person. Talk to your caregiver about changes you notice and are concerned about.  · During the last trimester, you have probably had an increase in your appetite. It is normal to have cravings for certain foods. This varies from person to person and pregnancy to pregnancy.  · You may begin to   get stretch marks on your hips, abdomen, and breasts. These are normal changes in the body during pregnancy. There are no exercises or medications to take which prevent this change.  · Constipation may be treated with a stool softener or adding bulk to your diet. Drinking lots of fluids, fiber in vegetables, fruits, and whole grains are helpful.  · Exercising is also helpful. If you have been very active up until your pregnancy, most of these activities can be continued during your pregnancy. If you have been less active, it is helpful to start an exercise  program such as walking. Consult your caregiver before starting exercise programs.  · Avoid all smoking, alcohol, un-prescribed drugs, herbs and "street drugs" during your pregnancy. These chemicals affect the formation and growth of the baby. Avoid chemicals throughout the pregnancy to ensure the delivery of a healthy infant.  · Backache, varicose veins and hemorrhoids may develop or get worse.  · You will tire more easily in the third trimester, which is normal.  · The baby's movements may be stronger and more often.  · You may become short of breath easily.  · Your belly button may stick out.  · A yellow discharge may leak from your breasts called colostrum.  · You may have a bloody mucus discharge. This usually occurs a few days to a week before labor begins.  HOME CARE INSTRUCTIONS   · Keep your caregiver's appointments. Follow your caregiver's instructions regarding medication use, exercise, and diet.  · During pregnancy, you are providing food for you and your baby. Continue to eat regular, well-balanced meals. Choose foods such as meat, fish, milk and other low fat dairy products, vegetables, fruits, and whole-grain breads and cereals. Your caregiver will tell you of the ideal weight gain.  · A physical sexual relationship may be continued throughout pregnancy if there are no other problems such as early (premature) leaking of amniotic fluid from the membranes, vaginal bleeding, or belly (abdominal) pain.  · Exercise regularly if there are no restrictions. Check with your caregiver if you are unsure of the safety of your exercises. Greater weight gain will occur in the last 2 trimesters of pregnancy. Exercising helps:  · Control your weight.  · Get you in shape for labor and delivery.  · You lose weight after you deliver.  · Rest a lot with legs elevated, or as needed for leg cramps or low back pain.  · Wear a good support or jogging bra for breast tenderness during pregnancy. This may help if worn during  sleep. Pads or tissues may be used in the bra if you are leaking colostrum.  · Do not use hot tubs, steam rooms, or saunas.  · Wear your seat belt when driving. This protects you and your baby if you are in an accident.  · Avoid raw meat, cat litter boxes and soil used by cats. These carry germs that can cause birth defects in the baby.  · It is easier to loose urine during pregnancy. Tightening up and strengthening the pelvic muscles will help with this problem. You can practice stopping your urination while you are going to the bathroom. These are the same muscles you need to strengthen. It is also the muscles you would use if you were trying to stop from passing gas. You can practice tightening these muscles up 10 times a set and repeating this about 3 times per day. Once you know what muscles to tighten up, do not perform these   exercises during urination. It is more likely to cause an infection by backing up the urine.  · Ask for help if you have financial, counseling or nutritional needs during pregnancy. Your caregiver will be able to offer counseling for these needs as well as refer you for other special needs.  · Make a list of emergency phone numbers and have them available.  · Plan on getting help from family or friends when you go home from the hospital.  · Make a trial run to the hospital.  · Take prenatal classes with the father to understand, practice and ask questions about the labor and delivery.  · Prepare the baby's room/nursery.  · Do not travel out of the city unless it is absolutely necessary and with the advice of your caregiver.  · Wear only low or no heal shoes to have better balance and prevent falling.  MEDICATIONS AND DRUG USE IN PREGNANCY  · Take prenatal vitamins as directed. The vitamin should contain 1 milligram of folic acid. Keep all vitamins out of reach of children. Only a couple vitamins or tablets containing iron may be fatal to a baby or young child when ingested.  · Avoid use  of all medications, including herbs, over-the-counter medications, not prescribed or suggested by your caregiver. Only take over-the-counter or prescription medicines for pain, discomfort, or fever as directed by your caregiver. Do not use aspirin, ibuprofen (Motrin®, Advil®, Nuprin®) or naproxen (Aleve®) unless OK'd by your caregiver.  · Let your caregiver also know about herbs you may be using.  · Alcohol is related to a number of birth defects. This includes fetal alcohol syndrome. All alcohol, in any form, should be avoided completely. Smoking will cause low birth rate and premature babies.  · Street/illegal drugs are very harmful to the baby. They are absolutely forbidden. A baby born to an addicted mother will be addicted at birth. The baby will go through the same withdrawal an adult does.  SEEK MEDICAL CARE IF:  You have any concerns or worries during your pregnancy. It is better to call with your questions if you feel they cannot wait, rather than worry about them.  DECISIONS ABOUT CIRCUMCISION  You may or may not know the sex of your baby. If you know your baby is a boy, it may be time to think about circumcision. Circumcision is the removal of the foreskin of the penis. This is the skin that covers the sensitive end of the penis. There is no proven medical need for this. Often this decision is made on what is popular at the time or based upon religious beliefs and social issues. You can discuss these issues with your caregiver or pediatrician.  SEEK IMMEDIATE MEDICAL CARE IF:   · An unexplained oral temperature above 102° F (38.9° C) develops, or as your caregiver suggests.  · You have leaking of fluid from the vagina (birth canal). If leaking membranes are suspected, take your temperature and tell your caregiver of this when you call.  · There is vaginal spotting, bleeding or passing clots. Tell your caregiver of the amount and how many pads are used.  · You develop a bad smelling vaginal discharge with  a change in the color from clear to white.  · You develop vomiting that lasts more than 24 hours.  · You develop chills or fever.  · You develop shortness of breath.  · You develop burning on urination.  · You loose more than 2 pounds of weight   or gain more than 2 pounds of weight or as suggested by your caregiver.  · You notice sudden swelling of your face, hands, and feet or legs.  · You develop belly (abdominal) pain. Round ligament discomfort is a common non-cancerous (benign) cause of abdominal pain in pregnancy. Your caregiver still must evaluate you.  · You develop a severe headache that does not go away.  · You develop visual problems, blurred or double vision.  · If you have not felt your baby move for more than 1 hour. If you think the baby is not moving as much as usual, eat something with sugar in it and lie down on your left side for an hour. The baby should move at least 4 to 5 times per hour. Call right away if your baby moves less than that.  · You fall, are in a car accident or any kind of trauma.  · There is mental or physical violence at home.  Document Released: 02/26/2001 Document Revised: 05/27/2011 Document Reviewed: 08/31/2008  [employer]® Patient Information ©2013 [employer].

## 2012-07-15 ENCOUNTER — Ambulatory Visit (INDEPENDENT_AMBULATORY_CARE_PROVIDER_SITE_OTHER): Payer: Medicaid Other | Admitting: Family Medicine

## 2012-07-15 ENCOUNTER — Other Ambulatory Visit: Payer: Self-pay | Admitting: Family Medicine

## 2012-07-15 VITALS — BP 140/82 | Wt 166.7 lb

## 2012-07-15 DIAGNOSIS — O163 Unspecified maternal hypertension, third trimester: Secondary | ICD-10-CM

## 2012-07-15 DIAGNOSIS — O139 Gestational [pregnancy-induced] hypertension without significant proteinuria, unspecified trimester: Secondary | ICD-10-CM

## 2012-07-15 DIAGNOSIS — O0993 Supervision of high risk pregnancy, unspecified, third trimester: Secondary | ICD-10-CM

## 2012-07-15 LAB — COMPLETE METABOLIC PANEL WITHOUT GFR
ALT: 10 U/L (ref 0–35)
AST: 11 U/L (ref 0–37)
Albumin: 3.2 g/dL — ABNORMAL LOW (ref 3.5–5.2)
Alkaline Phosphatase: 106 U/L (ref 39–117)
BUN: 4 mg/dL — ABNORMAL LOW (ref 6–23)
CO2: 21 meq/L (ref 19–32)
Calcium: 8.5 mg/dL (ref 8.4–10.5)
Chloride: 106 meq/L (ref 96–112)
Creat: 0.52 mg/dL (ref 0.50–1.10)
GFR, Est African American: >89 mL/min
GFR, Est Non African American: >89 mL/min
Glucose, Bld: 81 mg/dL (ref 70–99)
Potassium: 3.8 meq/L (ref 3.5–5.3)
Sodium: 134 meq/L — ABNORMAL LOW (ref 135–145)
Total Bilirubin: 0.3 mg/dL (ref 0.3–1.2)
Total Protein: 6.1 g/dL (ref 6.0–8.3)

## 2012-07-15 LAB — CBC
HCT: 29.7 % — ABNORMAL LOW (ref 36.0–46.0)
Hemoglobin: 9.6 g/dL — ABNORMAL LOW (ref 12.0–15.0)
MCH: 25.7 pg — ABNORMAL LOW (ref 26.0–34.0)
MCHC: 32.3 g/dL (ref 30.0–36.0)
MCV: 79.4 fL (ref 78.0–100.0)
Platelets: 212 10*3/uL (ref 150–400)
RBC: 3.74 MIL/uL — ABNORMAL LOW (ref 3.87–5.11)
RDW: 15.7 % — ABNORMAL HIGH (ref 11.5–15.5)
WBC: 7.3 10*3/uL (ref 4.0–10.5)

## 2012-07-15 LAB — POCT URINALYSIS DIP (DEVICE)
Bilirubin Urine: NEGATIVE
Glucose, UA: NEGATIVE mg/dL
Hgb urine dipstick: NEGATIVE
Ketones, ur: NEGATIVE mg/dL
Nitrite: NEGATIVE
Protein, ur: 30 mg/dL — AB
Specific Gravity, Urine: 1.02 (ref 1.005–1.030)
Urobilinogen, UA: 0.2 mg/dL (ref 0.0–1.0)
pH: 7 (ref 5.0–8.0)

## 2012-07-15 LAB — OB RESULTS CONSOLE GBS: GBS: NEGATIVE

## 2012-07-15 LAB — OB RESULTS CONSOLE GC/CHLAMYDIA
Chlamydia: NEGATIVE
Gonorrhea: NEGATIVE

## 2012-07-15 NOTE — Progress Notes (Signed)
134/90=BP

## 2012-07-15 NOTE — Progress Notes (Signed)
BP elevated today. Repeat 134/90. No HA, vision changes, or RUQ pain. Hx preeclampsia. PIH labs ordered. F/U one week. Preeclampsia precautions discussed.

## 2012-07-15 NOTE — Patient Instructions (Addendum)
Hypertension During Pregnancy Hypertension is also called high blood pressure. It can occur at any time in life and during pregnancy. When you have hypertension, there is extra pressure inside your blood vessels that carry blood from the heart to the rest of your body (arteries). Hypertension during pregnancy can cause problems for you and your baby. Your baby might not weigh as much as it should at birth or might be born early (premature). Very bad cases of hypertension during pregnancy can be life-threatening.  There are different types of hypertension during pregnancy.   Chronic hypertension. This happens when a woman has hypertension before pregnancy and it continues during pregnancy.  Gestational hypertension. This is when hypertension develops during pregnancy.  Preeclampsia or toxemia of pregnancy. This is a very serious type of hypertension that develops only during pregnancy. It is a disease that affects the whole body (systemic) and can be very dangerous for both mother and baby.  Gestational hypertension and preeclampsia usually go away after your baby is born. Blood pressure generally stabilizes within 6 weeks. Women who have hypertension during pregnancy have a greater chance of developing hypertension later in life or with future pregnancies. UNDERSTANDING BLOOD PRESSURE Blood pressure moves blood in your body. Sometimes, the force that moves the blood becomes too strong.  A blood pressure reading is given in 2 numbers and looks like a fraction.  The top number is called the systolic pressure. When your heart beats, it forces more blood to flow through the arteries. Pressure inside the arteries goes up.  The bottom number is the diastolic pressure. Pressure goes down between beats. That is when the heart is resting.  You may have hypertension if:  Your systolic blood pressure is above 140.  Your diastolic pressure is above 90. RISK FACTORS Some factors make you more likely to  develop hypertension during pregnancy. Risk factors include:  Having hypertension before pregnancy.  Having hypertension during a previous pregnancy.  Being overweight.  Being older than 40.  Being pregnant with more than 1 baby (multiples).  Having diabetes or kidney problems. SYMPTOMS Chronic and gestational hypertension may not cause symptoms. Preeclampsia has symptoms, which may include:  Increased protein in your urine. Your caregiver will check for this at every prenatal visit.  Swelling of your hands and face.  Rapid weight gain.  Headaches.  Visual changes.  Being bothered by light.  Abdominal pain, especially in the right upper area.  Chest pain.  Shortness of breath.  Increased reflexes.  Seizures. Seizures occur with a more severe form of preeclampsia, called eclampsia. DIAGNOSIS   You may be diagnosed with hypertension during pregnancy during a regular prenatal exam. At each visit, tests may include:  Blood pressure checks.  A urine test to check for protein in your urine.  The type of hypertension you are diagnosed with depends on when you developed it. It also depends on your specific blood pressure reading.  Developing hypertension before 20 weeks of pregnancy is consistent with chronic hypertension.  Developing hypertension after 20 weeks of pregnancy is consistent with gestational hypertension.  Hypertension with increased urinary protein is diagnosed as preeclampsia.  Blood pressure measurements that stay above 160 systolic or 110 diastolic are a sign of severe preeclampsia. TREATMENT Treatment for hypertension during pregnancy varies. Treatment depends on the type of hypertension and how serious it is.  If you take medicine for chronic hypertension, you may need to switch medicines.  Drugs called ACE inhibitors should not be taken during pregnancy.    Low-dose aspirin may be suggested for women who have risk factors for preeclampsia.  If  you have gestational hypertension, you may need to take a blood pressure medicine that is safe during pregnancy. Your caregiver will recommend the appropriate medicine.  If you have severe preeclampsia, you may need to be in the hospital. Caregivers will watch you and the baby very closely. You also may need to take medicine (magnesium sulfate) to prevent seizures and lower blood pressure.  Sometimes an early delivery is needed. This may be the case if the condition worsens. It would be done to protect you and the baby. The only cure for preeclampsia is delivery. HOME CARE INSTRUCTIONS  Schedule and keep all of your regular prenatal care.  Follow your caregiver's instructions for taking medicines. Tell your caregiver about all medicines you take. This includes over-the-counter medicines.  Eat as little salt as possible.  Get regular exercise.  Do not drink alcohol.  Do not use tobacco products.  Do not drink products with caffeine.  Lie on your left side when resting.  Tell your doctor if you have any preeclampsia symptoms. SEEK IMMEDIATE MEDICAL CARE IF:  You have severe abdominal pain.  You have sudden swelling in the hands, ankles, or face.  You gain 4 pounds (1.8 kg) or more in 1 week.  You vomit repeatedly.  You have vaginal bleeding.  You do not feel the baby moving as much.  You have a headache.  You have blurred or double vision.  You have muscle twitching or spasms.  You have shortness of breath.  You have blue fingernails and lips.  You have blood in your urine. MAKE SURE YOU:  Understand these instructions.  Will watch your condition.  Will get help right away if you are not doing well. Document Released: 11/20/2010 Document Revised: 05/27/2011 Document Reviewed: 11/20/2010 [facility] Patient Information 2013 [place], [place].   Pregnancy - Third Trimester The third trimester of pregnancy (the last 3 months) is a period of the most rapid growth for  you and your baby. The baby approaches a length of 20 inches and a weight of 6 to 10 pounds. The baby is adding on fat and getting ready for life outside your body. While inside, babies have periods of sleeping and waking, suck their thumbs, and hiccups. You can often feel small contractions of the uterus. This is false labor. It is also called Braxton-Hicks contractions. This is like a practice for labor. The usual problems in this stage of pregnancy include more difficulty breathing, swelling of the hands and feet from water retention, and having to urinate more often because of the uterus and baby pressing on your bladder.  PRENATAL EXAMS  Blood work may continue to be done during prenatal exams. These tests are done to check on your health and the probable health of your baby. Blood work is used to follow your blood levels (hemoglobin). Anemia (low hemoglobin) is common during pregnancy. Iron and vitamins are given to help prevent this. You may also continue to be checked for diabetes. Some of the past blood tests may be done again.  The size of the uterus is measured during each visit. This makes sure your baby is growing properly according to your pregnancy dates.  Your blood pressure is checked every prenatal visit. This is to make sure you are not getting toxemia.  Your urine is checked every prenatal visit for infection, diabetes and protein.  Your weight is checked at each visit. This  is done to make sure gains are happening at the suggested rate and that you and your baby are growing normally.  Sometimes, an ultrasound is performed to confirm the position and the proper growth and development of the baby. This is a test done that bounces harmless sound waves off the baby so your caregiver can more accurately determine due dates.  Discuss the type of pain medication and anesthesia you will have during your labor and delivery.  Discuss the possibility and anesthesia if a Cesarean Section  might be necessary.  Inform your caregiver if there is any mental or physical violence at home. Sometimes, a specialized non-stress test, contraction stress test and biophysical profile are done to make sure the baby is not having a problem. Checking the amniotic fluid surrounding the baby is called an amniocentesis. The amniotic fluid is removed by sticking a needle into the belly (abdomen). This is sometimes done near the end of pregnancy if an early delivery is required. In this case, it is done to help make sure the baby's lungs are mature enough for the baby to live outside of the womb. If the lungs are not mature and it is unsafe to deliver the baby, an injection of cortisone medication is given to the mother 1 to 2 days before the delivery. This helps the baby's lungs mature and makes it safer to deliver the baby. CHANGES OCCURING IN THE THIRD TRIMESTER OF PREGNANCY Your body goes through many changes during pregnancy. They vary from person to person. Talk to your caregiver about changes you notice and are concerned about.  During the last trimester, you have probably had an increase in your appetite. It is normal to have cravings for certain foods. This varies from person to person and pregnancy to pregnancy.  You may begin to get stretch marks on your hips, abdomen, and breasts. These are normal changes in the body during pregnancy. There are no exercises or medications to take which prevent this change.  Constipation may be treated with a stool softener or adding bulk to your diet. Drinking lots of fluids, fiber in vegetables, fruits, and whole grains are helpful.  Exercising is also helpful. If you have been very active up until your pregnancy, most of these activities can be continued during your pregnancy. If you have been less active, it is helpful to start an exercise program such as walking. Consult your caregiver before starting exercise programs.  Avoid all smoking, alcohol,  un-prescribed drugs, herbs and "street drugs" during your pregnancy. These chemicals affect the formation and growth of the baby. Avoid chemicals throughout the pregnancy to ensure the delivery of a healthy infant.  Backache, varicose veins and hemorrhoids may develop or get worse.  You will tire more easily in the third trimester, which is normal.  The baby's movements may be stronger and more often.  You may become short of breath easily.  Your belly button may stick out.  A yellow discharge may leak from your breasts called colostrum.  You may have a bloody mucus discharge. This usually occurs a few days to a week before labor begins. HOME CARE INSTRUCTIONS   Keep your caregiver's appointments. Follow your caregiver's instructions regarding medication use, exercise, and diet.  During pregnancy, you are providing food for you and your baby. Continue to eat regular, well-balanced meals. Choose foods such as meat, fish, milk and other low fat dairy products, vegetables, fruits, and whole-grain breads and cereals. Your caregiver will tell you of the  ideal weight gain.  A physical sexual relationship may be continued throughout pregnancy if there are no other problems such as early (premature) leaking of amniotic fluid from the membranes, vaginal bleeding, or belly (abdominal) pain.  Exercise regularly if there are no restrictions. Check with your caregiver if you are unsure of the safety of your exercises. Greater weight gain will occur in the last 2 trimesters of pregnancy. Exercising helps:  Control your weight.  Get you in shape for labor and delivery.  You lose weight after you deliver.  Rest a lot with legs elevated, or as needed for leg cramps or low back pain.  Wear a good support or jogging bra for breast tenderness during pregnancy. This may help if worn during sleep. Pads or tissues may be used in the bra if you are leaking colostrum.  Do not use hot tubs, steam rooms, or  saunas.  Wear your seat belt when driving. This protects you and your baby if you are in an accident.  Avoid raw meat, cat litter boxes and soil used by cats. These carry germs that can cause birth defects in the baby.  It is easier to loose urine during pregnancy. Tightening up and strengthening the pelvic muscles will help with this problem. You can practice stopping your urination while you are going to the bathroom. These are the same muscles you need to strengthen. It is also the muscles you would use if you were trying to stop from passing gas. You can practice tightening these muscles up 10 times a set and repeating this about 3 times per day. Once you know what muscles to tighten up, do not perform these exercises during urination. It is more likely to cause an infection by backing up the urine.  Ask for help if you have financial, counseling or nutritional needs during pregnancy. Your caregiver will be able to offer counseling for these needs as well as refer you for other special needs.  Make a list of emergency phone numbers and have them available.  Plan on getting help from family or friends when you go home from the hospital.  Make a trial run to the hospital.  Take prenatal classes with the father to understand, practice and ask questions about the labor and delivery.  Prepare the baby's room/nursery.  Do not travel out of the city unless it is absolutely necessary and with the advice of your caregiver.  Wear only low or no heal shoes to have better balance and prevent falling. MEDICATIONS AND DRUG USE IN PREGNANCY  Take prenatal vitamins as directed. The vitamin should contain 1 milligram of folic acid. Keep all vitamins out of reach of children. Only a couple vitamins or tablets containing iron may be fatal to a baby or young child when ingested.  Avoid use of all medications, including herbs, over-the-counter medications, not prescribed or suggested by your caregiver.  Only take over-the-counter or prescription medicines for pain, discomfort, or fever as directed by your caregiver. Do not use aspirin, ibuprofen (Motrin, Advil, Nuprin) or naproxen (Aleve) unless OK'd by your caregiver.  Let your caregiver also know about herbs you may be using.  Alcohol is related to a number of birth defects. This includes fetal alcohol syndrome. All alcohol, in any form, should be avoided completely. Smoking will cause low birth rate and premature babies.  Street/illegal drugs are very harmful to the baby. They are absolutely forbidden. A baby born to an addicted mother will be addicted at birth. The  baby will go through the same withdrawal an adult does. SEEK MEDICAL CARE IF: You have any concerns or worries during your pregnancy. It is better to call with your questions if you feel they cannot wait, rather than worry about them. DECISIONS ABOUT CIRCUMCISION You may or may not know the sex of your baby. If you know your baby is a boy, it may be time to think about circumcision. Circumcision is the removal of the foreskin of the penis. This is the skin that covers the sensitive end of the penis. There is no proven medical need for this. Often this decision is made on what is popular at the time or based upon religious beliefs and social issues. You can discuss these issues with your caregiver or pediatrician. SEEK IMMEDIATE MEDICAL CARE IF:   An unexplained oral temperature above 102 F (38.9 C) develops, or as your caregiver suggests.  You have leaking of fluid from the vagina (birth canal). If leaking membranes are suspected, take your temperature and tell your caregiver of this when you call.  There is vaginal spotting, bleeding or passing clots. Tell your caregiver of the amount and how many pads are used.  You develop a bad smelling vaginal discharge with a change in the color from clear to white.  You develop vomiting that lasts more than 24 hours.  You develop  chills or fever.  You develop shortness of breath.  You develop burning on urination.  You loose more than 2 pounds of weight or gain more than 2 pounds of weight or as suggested by your caregiver.  You notice sudden swelling of your face, hands, and feet or legs.  You develop belly (abdominal) pain. Round ligament discomfort is a common non-cancerous (benign) cause of abdominal pain in pregnancy. Your caregiver still must evaluate you.  You develop a severe headache that does not go away.  You develop visual problems, blurred or double vision.  If you have not felt your baby move for more than 1 hour. If you think the baby is not moving as much as usual, eat something with sugar in it and lie down on your left side for an hour. The baby should move at least 4 to 5 times per hour. Call right away if your baby moves less than that.  You fall, are in a car accident or any kind of trauma.  There is mental or physical violence at home. Document Released: 02/26/2001 Document Revised: 05/27/2011 Document Reviewed: 08/31/2008 [facility] Patient Information 2013 [place], [place].

## 2012-07-16 LAB — PROTEIN / CREATININE RATIO, URINE
Creatinine, Urine: 142.4 mg/dL
Protein Creatinine Ratio: 0.15 — ABNORMAL HIGH (ref <0.15)
Total Protein, Urine: 21 mg/dL

## 2012-07-16 LAB — GC/CHLAMYDIA PROBE AMP
CT Probe RNA: NEGATIVE
GC Probe RNA: NEGATIVE

## 2012-07-18 LAB — CULTURE, BETA STREP (GROUP B ONLY)

## 2012-07-19 ENCOUNTER — Encounter: Payer: Self-pay | Admitting: Family Medicine

## 2012-07-22 ENCOUNTER — Ambulatory Visit (INDEPENDENT_AMBULATORY_CARE_PROVIDER_SITE_OTHER): Payer: Medicaid Other | Admitting: Family Medicine

## 2012-07-22 ENCOUNTER — Other Ambulatory Visit: Payer: Self-pay | Admitting: Family Medicine

## 2012-07-22 VITALS — BP 130/84 | Temp 97.0°F | Wt 170.3 lb

## 2012-07-22 DIAGNOSIS — O0993 Supervision of high risk pregnancy, unspecified, third trimester: Secondary | ICD-10-CM

## 2012-07-22 DIAGNOSIS — O26843 Uterine size-date discrepancy, third trimester: Secondary | ICD-10-CM

## 2012-07-22 DIAGNOSIS — O26849 Uterine size-date discrepancy, unspecified trimester: Secondary | ICD-10-CM

## 2012-07-22 LAB — POCT URINALYSIS DIP (DEVICE)
Bilirubin Urine: NEGATIVE
Glucose, UA: NEGATIVE mg/dL
Hgb urine dipstick: NEGATIVE
Nitrite: NEGATIVE
Protein, ur: NEGATIVE mg/dL
Specific Gravity, Urine: 1.015 (ref 1.005–1.030)
Urobilinogen, UA: 0.2 mg/dL (ref 0.0–1.0)
pH: 7 (ref 5.0–8.0)

## 2012-07-22 NOTE — Progress Notes (Signed)
Pulse- 82 Patient reports pelvic pain when getting out of bed & "braxton hicks" contractions

## 2012-07-22 NOTE — Patient Instructions (Addendum)
Normal Labor and Delivery Your caregiver must first be sure you are in labor. Signs of labor include:  You may pass what is called "the mucus plug" before labor begins. This is a small amount of blood stained mucus.  Regular uterine contractions.  The time between contractions get closer together.  The discomfort and pain gradually gets more intense.  Pains are mostly located in the back.  Pains get worse when walking.  The cervix (the opening of the uterus becomes thinner (begins to efface) and opens up (dilates). Once you are in labor and admitted into the hospital or care center, your caregiver will do the following:  A complete physical examination.  Check your vital signs (blood pressure, pulse, temperature and the fetal heart rate).  Do a vaginal examination (using a sterile glove and lubricant) to determine:  The position (presentation) of the baby (head [vertex] or buttock first).  The level (station) of the baby's head in the birth canal.  The effacement and dilatation of the cervix.  You may have your pubic hair shaved and be given an enema depending on your caregiver and the circumstance.  An electronic monitor is usually placed on your abdomen. The monitor follows the length and intensity of the contractions, as well as the baby's heart rate.  Usually, your caregiver will insert an IV in your arm with a bottle of sugar water. This is done as a precaution so that medications can be given to you quickly during labor or delivery. NORMAL LABOR AND DELIVERY IS DIVIDED UP INTO 3 STAGES: First Stage This is when regular contractions begin and the cervix begins to efface and dilate. This stage can last from 3 to 15 hours. The end of the first stage is when the cervix is 100% effaced and 10 centimeters dilated. Pain medications may be given by   Injection (morphine, demerol, etc.)  Regional anesthesia (spinal, caudal or epidural, anesthetics given in different locations of  the spine). Paracervical pain medication may be given, which is an injection of and anesthetic on each side of the cervix. A pregnant woman may request to have "Natural Childbirth" which is not to have any medications or anesthesia during her labor and delivery. Second Stage This is when the baby comes down through the birth canal (vagina) and is born. This can take 1 to 4 hours. As the baby's head comes down through the birth canal, you may feel like you are going to have a bowel movement. You will get the urge to bear down and push until the baby is delivered. As the baby's head is being delivered, the caregiver will decide if an episiotomy (a cut in the perineum and vagina area) is needed to prevent tearing of the tissue in this area. The episiotomy is sewn up after the delivery of the baby and placenta. Sometimes a mask with nitrous oxide is given for the mother to breath during the delivery of the baby to help if there is too much pain. The end of Stage 2 is when the baby is fully delivered. Then when the umbilical cord stops pulsating it is clamped and cut. Third Stage The third stage begins after the baby is completely delivered and ends after the placenta (afterbirth) is delivered. This usually takes 5 to 30 minutes. After the placenta is delivered, a medication is given either by intravenous or injection to help contract the uterus and prevent bleeding. The third stage is not painful and pain medication is usually not necessary.  If an episiotomy was done, it is repaired at this time. After the delivery, the mother is watched and monitored closely for 1 to 2 hours to make sure there is no postpartum bleeding (hemorrhage). If there is a lot of bleeding, medication is given to contract the uterus and stop the bleeding. Document Released: 12/12/2007 Document Revised: 05/27/2011 Document Reviewed: 12/12/2007 [facility] Patient Information 2013 [place], [place].   Pregnancy - Third Trimester The third  trimester of pregnancy (the last 3 months) is a period of the most rapid growth for you and your baby. The baby approaches a length of 20 inches and a weight of 6 to 10 pounds. The baby is adding on fat and getting ready for life outside your body. While inside, babies have periods of sleeping and waking, suck their thumbs, and hiccups. You can often feel small contractions of the uterus. This is false labor. It is also called Braxton-Hicks contractions. This is like a practice for labor. The usual problems in this stage of pregnancy include more difficulty breathing, swelling of the hands and feet from water retention, and having to urinate more often because of the uterus and baby pressing on your bladder.  PRENATAL EXAMS  Blood work may continue to be done during prenatal exams. These tests are done to check on your health and the probable health of your baby. Blood work is used to follow your blood levels (hemoglobin). Anemia (low hemoglobin) is common during pregnancy. Iron and vitamins are given to help prevent this. You may also continue to be checked for diabetes. Some of the past blood tests may be done again.  The size of the uterus is measured during each visit. This makes sure your baby is growing properly according to your pregnancy dates.  Your blood pressure is checked every prenatal visit. This is to make sure you are not getting toxemia.  Your urine is checked every prenatal visit for infection, diabetes and protein.  Your weight is checked at each visit. This is done to make sure gains are happening at the suggested rate and that you and your baby are growing normally.  Sometimes, an ultrasound is performed to confirm the position and the proper growth and development of the baby. This is a test done that bounces harmless sound waves off the baby so your caregiver can more accurately determine due dates.  Discuss the type of pain medication and anesthesia you will have during your  labor and delivery.  Discuss the possibility and anesthesia if a Cesarean Section might be necessary.  Inform your caregiver if there is any mental or physical violence at home. Sometimes, a specialized non-stress test, contraction stress test and biophysical profile are done to make sure the baby is not having a problem. Checking the amniotic fluid surrounding the baby is called an amniocentesis. The amniotic fluid is removed by sticking a needle into the belly (abdomen). This is sometimes done near the end of pregnancy if an early delivery is required. In this case, it is done to help make sure the baby's lungs are mature enough for the baby to live outside of the womb. If the lungs are not mature and it is unsafe to deliver the baby, an injection of cortisone medication is given to the mother 1 to 2 days before the delivery. This helps the baby's lungs mature and makes it safer to deliver the baby. CHANGES OCCURING IN THE THIRD TRIMESTER OF PREGNANCY Your body goes through many changes during pregnancy. They  vary from person to person. Talk to your caregiver about changes you notice and are concerned about.  During the last trimester, you have probably had an increase in your appetite. It is normal to have cravings for certain foods. This varies from person to person and pregnancy to pregnancy.  You may begin to get stretch marks on your hips, abdomen, and breasts. These are normal changes in the body during pregnancy. There are no exercises or medications to take which prevent this change.  Constipation may be treated with a stool softener or adding bulk to your diet. Drinking lots of fluids, fiber in vegetables, fruits, and whole grains are helpful.  Exercising is also helpful. If you have been very active up until your pregnancy, most of these activities can be continued during your pregnancy. If you have been less active, it is helpful to start an exercise program such as walking. Consult your  caregiver before starting exercise programs.  Avoid all smoking, alcohol, un-prescribed drugs, herbs and "street drugs" during your pregnancy. These chemicals affect the formation and growth of the baby. Avoid chemicals throughout the pregnancy to ensure the delivery of a healthy infant.  Backache, varicose veins and hemorrhoids may develop or get worse.  You will tire more easily in the third trimester, which is normal.  The baby's movements may be stronger and more often.  You may become short of breath easily.  Your belly button may stick out.  A yellow discharge may leak from your breasts called colostrum.  You may have a bloody mucus discharge. This usually occurs a few days to a week before labor begins. HOME CARE INSTRUCTIONS   Keep your caregiver's appointments. Follow your caregiver's instructions regarding medication use, exercise, and diet.  During pregnancy, you are providing food for you and your baby. Continue to eat regular, well-balanced meals. Choose foods such as meat, fish, milk and other low fat dairy products, vegetables, fruits, and whole-grain breads and cereals. Your caregiver will tell you of the ideal weight gain.  A physical sexual relationship may be continued throughout pregnancy if there are no other problems such as early (premature) leaking of amniotic fluid from the membranes, vaginal bleeding, or belly (abdominal) pain.  Exercise regularly if there are no restrictions. Check with your caregiver if you are unsure of the safety of your exercises. Greater weight gain will occur in the last 2 trimesters of pregnancy. Exercising helps:  Control your weight.  Get you in shape for labor and delivery.  You lose weight after you deliver.  Rest a lot with legs elevated, or as needed for leg cramps or low back pain.  Wear a good support or jogging bra for breast tenderness during pregnancy. This may help if worn during sleep. Pads or tissues may be used in  the bra if you are leaking colostrum.  Do not use hot tubs, steam rooms, or saunas.  Wear your seat belt when driving. This protects you and your baby if you are in an accident.  Avoid raw meat, cat litter boxes and soil used by cats. These carry germs that can cause birth defects in the baby.  It is easier to loose urine during pregnancy. Tightening up and strengthening the pelvic muscles will help with this problem. You can practice stopping your urination while you are going to the bathroom. These are the same muscles you need to strengthen. It is also the muscles you would use if you were trying to stop from passing gas.  You can practice tightening these muscles up 10 times a set and repeating this about 3 times per day. Once you know what muscles to tighten up, do not perform these exercises during urination. It is more likely to cause an infection by backing up the urine.  Ask for help if you have financial, counseling or nutritional needs during pregnancy. Your caregiver will be able to offer counseling for these needs as well as refer you for other special needs.  Make a list of emergency phone numbers and have them available.  Plan on getting help from family or friends when you go home from the hospital.  Make a trial run to the hospital.  Take prenatal classes with the father to understand, practice and ask questions about the labor and delivery.  Prepare the baby's room/nursery.  Do not travel out of the city unless it is absolutely necessary and with the advice of your caregiver.  Wear only low or no heal shoes to have better balance and prevent falling. MEDICATIONS AND DRUG USE IN PREGNANCY  Take prenatal vitamins as directed. The vitamin should contain 1 milligram of folic acid. Keep all vitamins out of reach of children. Only a couple vitamins or tablets containing iron may be fatal to a baby or young child when ingested.  Avoid use of all medications, including herbs,  over-the-counter medications, not prescribed or suggested by your caregiver. Only take over-the-counter or prescription medicines for pain, discomfort, or fever as directed by your caregiver. Do not use aspirin, ibuprofen (Motrin, Advil, Nuprin) or naproxen (Aleve) unless OK'd by your caregiver.  Let your caregiver also know about herbs you may be using.  Alcohol is related to a number of birth defects. This includes fetal alcohol syndrome. All alcohol, in any form, should be avoided completely. Smoking will cause low birth rate and premature babies.  Street/illegal drugs are very harmful to the baby. They are absolutely forbidden. A baby born to an addicted mother will be addicted at birth. The baby will go through the same withdrawal an adult does. SEEK MEDICAL CARE IF: You have any concerns or worries during your pregnancy. It is better to call with your questions if you feel they cannot wait, rather than worry about them. DECISIONS ABOUT CIRCUMCISION You may or may not know the sex of your baby. If you know your baby is a boy, it may be time to think about circumcision. Circumcision is the removal of the foreskin of the penis. This is the skin that covers the sensitive end of the penis. There is no proven medical need for this. Often this decision is made on what is popular at the time or based upon religious beliefs and social issues. You can discuss these issues with your caregiver or pediatrician. SEEK IMMEDIATE MEDICAL CARE IF:   An unexplained oral temperature above 102 F (38.9 C) develops, or as your caregiver suggests.  You have leaking of fluid from the vagina (birth canal). If leaking membranes are suspected, take your temperature and tell your caregiver of this when you call.  There is vaginal spotting, bleeding or passing clots. Tell your caregiver of the amount and how many pads are used.  You develop a bad smelling vaginal discharge with a change in the color from clear to  white.  You develop vomiting that lasts more than 24 hours.  You develop chills or fever.  You develop shortness of breath.  You develop burning on urination.  You loose more than 2  pounds of weight or gain more than 2 pounds of weight or as suggested by your caregiver.  You notice sudden swelling of your face, hands, and feet or legs.  You develop belly (abdominal) pain. Round ligament discomfort is a common non-cancerous (benign) cause of abdominal pain in pregnancy. Your caregiver still must evaluate you.  You develop a severe headache that does not go away.  You develop visual problems, blurred or double vision.  If you have not felt your baby move for more than 1 hour. If you think the baby is not moving as much as usual, eat something with sugar in it and lie down on your left side for an hour. The baby should move at least 4 to 5 times per hour. Call right away if your baby moves less than that.  You fall, are in a car accident or any kind of trauma.  There is mental or physical violence at home. Document Released: 02/26/2001 Document Revised: 05/27/2011 Document Reviewed: 08/31/2008 [facility] Patient Information 2013 [place], [place].

## 2012-07-22 NOTE — Progress Notes (Signed)
BH ctx, no Bleeding or fluid.

## 2012-07-27 ENCOUNTER — Ambulatory Visit (HOSPITAL_COMMUNITY)
Admission: RE | Admit: 2012-07-27 | Discharge: 2012-07-27 | Disposition: A | Discharge status: Home / Self Care | Payer: Medicaid Other | Source: Ambulatory Visit | Attending: Family Medicine | Admitting: Family Medicine

## 2012-07-27 DIAGNOSIS — O365990 Maternal care for other known or suspected poor fetal growth, unspecified trimester, not applicable or unspecified: Secondary | ICD-10-CM | POA: Insufficient documentation

## 2012-07-27 DIAGNOSIS — O139 Gestational [pregnancy-induced] hypertension without significant proteinuria, unspecified trimester: Secondary | ICD-10-CM | POA: Insufficient documentation

## 2012-07-27 DIAGNOSIS — O26843 Uterine size-date discrepancy, third trimester: Secondary | ICD-10-CM

## 2012-07-29 ENCOUNTER — Ambulatory Visit (INDEPENDENT_AMBULATORY_CARE_PROVIDER_SITE_OTHER): Payer: Medicaid Other | Admitting: Obstetrics and Gynecology

## 2012-07-29 VITALS — BP 135/86 | Temp 98.2°F | Wt 171.8 lb

## 2012-07-29 DIAGNOSIS — O09299 Supervision of pregnancy with other poor reproductive or obstetric history, unspecified trimester: Secondary | ICD-10-CM

## 2012-07-29 DIAGNOSIS — O0993 Supervision of high risk pregnancy, unspecified, third trimester: Secondary | ICD-10-CM

## 2012-07-29 LAB — POCT URINALYSIS DIP (DEVICE)
Bilirubin Urine: NEGATIVE
Glucose, UA: NEGATIVE mg/dL
Hgb urine dipstick: NEGATIVE
Ketones, ur: NEGATIVE mg/dL
Nitrite: NEGATIVE
Protein, ur: 30 mg/dL — AB
Specific Gravity, Urine: 1.015 (ref 1.005–1.030)
Urobilinogen, UA: 0.2 mg/dL (ref 0.0–1.0)
pH: 7 (ref 5.0–8.0)

## 2012-07-29 NOTE — Progress Notes (Signed)
P=79, c/o edema in feet, ankles, fingers,thinks mucous plug came out 5/8 because had a lot of mucousy discharge. Thinks she is leaking fluid since last Wednesday. C/o watery discharge

## 2012-07-29 NOTE — Progress Notes (Signed)
Wetness since last Wed. Not enough for minipad, no spurt or gush. Mucousy at first. SSE: neg pool, neg fern. S/sx labor reviewed.

## 2012-07-29 NOTE — Patient Instructions (Signed)
Pregnancy - Third Trimester  The third trimester of pregnancy (the last 3 months) is a period of the most rapid growth for you and your baby. The baby approaches a length of 20 inches and a weight of 6 to 10 pounds. The baby is adding on fat and getting ready for life outside your body. While inside, babies have periods of sleeping and waking, suck their thumbs, and hiccups. You can often feel small contractions of the uterus. This is false labor. It is also called Braxton-Hicks contractions. This is like a practice for labor. The usual problems in this stage of pregnancy include more difficulty breathing, swelling of the hands and feet from water retention, and having to urinate more often because of the uterus and baby pressing on your bladder.   PRENATAL EXAMS  · Blood work may continue to be done during prenatal exams. These tests are done to check on your health and the probable health of your baby. Blood work is used to follow your blood levels (hemoglobin). Anemia (low hemoglobin) is common during pregnancy. Iron and vitamins are given to help prevent this. You may also continue to be checked for diabetes. Some of the past blood tests may be done again.  · The size of the uterus is measured during each visit. This makes sure your baby is growing properly according to your pregnancy dates.  · Your blood pressure is checked every prenatal visit. This is to make sure you are not getting toxemia.  · Your urine is checked every prenatal visit for infection, diabetes and protein.  · Your weight is checked at each visit. This is done to make sure gains are happening at the suggested rate and that you and your baby are growing normally.  · Sometimes, an ultrasound is performed to confirm the position and the proper growth and development of the baby. This is a test done that bounces harmless sound waves off the baby so your caregiver can more accurately determine due dates.  · Discuss the type of pain medication and  anesthesia you will have during your labor and delivery.  · Discuss the possibility and anesthesia if a Cesarean Section might be necessary.  · Inform your caregiver if there is any mental or physical violence at home.  Sometimes, a specialized non-stress test, contraction stress test and biophysical profile are done to make sure the baby is not having a problem. Checking the amniotic fluid surrounding the baby is called an amniocentesis. The amniotic fluid is removed by sticking a needle into the belly (abdomen). This is sometimes done near the end of pregnancy if an early delivery is required. In this case, it is done to help make sure the baby's lungs are mature enough for the baby to live outside of the womb. If the lungs are not mature and it is unsafe to deliver the baby, an injection of cortisone medication is given to the mother 1 to 2 days before the delivery. This helps the baby's lungs mature and makes it safer to deliver the baby.  CHANGES OCCURING IN THE THIRD TRIMESTER OF PREGNANCY  Your body goes through many changes during pregnancy. They vary from person to person. Talk to your caregiver about changes you notice and are concerned about.  · During the last trimester, you have probably had an increase in your appetite. It is normal to have cravings for certain foods. This varies from person to person and pregnancy to pregnancy.  · You may begin to   get stretch marks on your hips, abdomen, and breasts. These are normal changes in the body during pregnancy. There are no exercises or medications to take which prevent this change.  · Constipation may be treated with a stool softener or adding bulk to your diet. Drinking lots of fluids, fiber in vegetables, fruits, and whole grains are helpful.  · Exercising is also helpful. If you have been very active up until your pregnancy, most of these activities can be continued during your pregnancy. If you have been less active, it is helpful to start an exercise  program such as walking. Consult your caregiver before starting exercise programs.  · Avoid all smoking, alcohol, un-prescribed drugs, herbs and "street drugs" during your pregnancy. These chemicals affect the formation and growth of the baby. Avoid chemicals throughout the pregnancy to ensure the delivery of a healthy infant.  · Backache, varicose veins and hemorrhoids may develop or get worse.  · You will tire more easily in the third trimester, which is normal.  · The baby's movements may be stronger and more often.  · You may become short of breath easily.  · Your belly button may stick out.  · A yellow discharge may leak from your breasts called colostrum.  · You may have a bloody mucus discharge. This usually occurs a few days to a week before labor begins.  HOME CARE INSTRUCTIONS   · Keep your caregiver's appointments. Follow your caregiver's instructions regarding medication use, exercise, and diet.  · During pregnancy, you are providing food for you and your baby. Continue to eat regular, well-balanced meals. Choose foods such as meat, fish, milk and other low fat dairy products, vegetables, fruits, and whole-grain breads and cereals. Your caregiver will tell you of the ideal weight gain.  · A physical sexual relationship may be continued throughout pregnancy if there are no other problems such as early (premature) leaking of amniotic fluid from the membranes, vaginal bleeding, or belly (abdominal) pain.  · Exercise regularly if there are no restrictions. Check with your caregiver if you are unsure of the safety of your exercises. Greater weight gain will occur in the last 2 trimesters of pregnancy. Exercising helps:  · Control your weight.  · Get you in shape for labor and delivery.  · You lose weight after you deliver.  · Rest a lot with legs elevated, or as needed for leg cramps or low back pain.  · Wear a good support or jogging bra for breast tenderness during pregnancy. This may help if worn during  sleep. Pads or tissues may be used in the bra if you are leaking colostrum.  · Do not use hot tubs, steam rooms, or saunas.  · Wear your seat belt when driving. This protects you and your baby if you are in an accident.  · Avoid raw meat, cat litter boxes and soil used by cats. These carry germs that can cause birth defects in the baby.  · It is easier to loose urine during pregnancy. Tightening up and strengthening the pelvic muscles will help with this problem. You can practice stopping your urination while you are going to the bathroom. These are the same muscles you need to strengthen. It is also the muscles you would use if you were trying to stop from passing gas. You can practice tightening these muscles up 10 times a set and repeating this about 3 times per day. Once you know what muscles to tighten up, do not perform these   exercises during urination. It is more likely to cause an infection by backing up the urine.  · Ask for help if you have financial, counseling or nutritional needs during pregnancy. Your caregiver will be able to offer counseling for these needs as well as refer you for other special needs.  · Make a list of emergency phone numbers and have them available.  · Plan on getting help from family or friends when you go home from the hospital.  · Make a trial run to the hospital.  · Take prenatal classes with the father to understand, practice and ask questions about the labor and delivery.  · Prepare the baby's room/nursery.  · Do not travel out of the city unless it is absolutely necessary and with the advice of your caregiver.  · Wear only low or no heal shoes to have better balance and prevent falling.  MEDICATIONS AND DRUG USE IN PREGNANCY  · Take prenatal vitamins as directed. The vitamin should contain 1 milligram of folic acid. Keep all vitamins out of reach of children. Only a couple vitamins or tablets containing iron may be fatal to a baby or young child when ingested.  · Avoid use  of all medications, including herbs, over-the-counter medications, not prescribed or suggested by your caregiver. Only take over-the-counter or prescription medicines for pain, discomfort, or fever as directed by your caregiver. Do not use aspirin, ibuprofen (Motrin®, Advil®, Nuprin®) or naproxen (Aleve®) unless OK'd by your caregiver.  · Let your caregiver also know about herbs you may be using.  · Alcohol is related to a number of birth defects. This includes fetal alcohol syndrome. All alcohol, in any form, should be avoided completely. Smoking will cause low birth rate and premature babies.  · Street/illegal drugs are very harmful to the baby. They are absolutely forbidden. A baby born to an addicted mother will be addicted at birth. The baby will go through the same withdrawal an adult does.  SEEK MEDICAL CARE IF:  You have any concerns or worries during your pregnancy. It is better to call with your questions if you feel they cannot wait, rather than worry about them.  DECISIONS ABOUT CIRCUMCISION  You may or may not know the sex of your baby. If you know your baby is a boy, it may be time to think about circumcision. Circumcision is the removal of the foreskin of the penis. This is the skin that covers the sensitive end of the penis. There is no proven medical need for this. Often this decision is made on what is popular at the time or based upon religious beliefs and social issues. You can discuss these issues with your caregiver or pediatrician.  SEEK IMMEDIATE MEDICAL CARE IF:   · An unexplained oral temperature above 102° F (38.9° C) develops, or as your caregiver suggests.  · You have leaking of fluid from the vagina (birth canal). If leaking membranes are suspected, take your temperature and tell your caregiver of this when you call.  · There is vaginal spotting, bleeding or passing clots. Tell your caregiver of the amount and how many pads are used.  · You develop a bad smelling vaginal discharge with  a change in the color from clear to white.  · You develop vomiting that lasts more than 24 hours.  · You develop chills or fever.  · You develop shortness of breath.  · You develop burning on urination.  · You loose more than 2 pounds of weight   or gain more than 2 pounds of weight or as suggested by your caregiver.  · You notice sudden swelling of your face, hands, and feet or legs.  · You develop belly (abdominal) pain. Round ligament discomfort is a common non-cancerous (benign) cause of abdominal pain in pregnancy. Your caregiver still must evaluate you.  · You develop a severe headache that does not go away.  · You develop visual problems, blurred or double vision.  · If you have not felt your baby move for more than 1 hour. If you think the baby is not moving as much as usual, eat something with sugar in it and lie down on your left side for an hour. The baby should move at least 4 to 5 times per hour. Call right away if your baby moves less than that.  · You fall, are in a car accident or any kind of trauma.  · There is mental or physical violence at home.  Document Released: 02/26/2001 Document Revised: 05/27/2011 Document Reviewed: 08/31/2008  [employer]® Patient Information ©2013 [employer].

## 2012-08-05 ENCOUNTER — Inpatient Hospital Stay (HOSPITAL_COMMUNITY)
Admission: AD | Admit: 2012-08-05 | Discharge: 2012-08-07 | DRG: 775 | Disposition: A | Discharge status: Home / Self Care | Payer: Medicaid Other | Source: Ambulatory Visit | Attending: Obstetrics and Gynecology | Admitting: Obstetrics and Gynecology

## 2012-08-05 ENCOUNTER — Encounter (HOSPITAL_COMMUNITY): Payer: Self-pay | Admitting: Obstetrics and Gynecology

## 2012-08-05 ENCOUNTER — Ambulatory Visit (INDEPENDENT_AMBULATORY_CARE_PROVIDER_SITE_OTHER): Payer: Medicaid Other | Admitting: Family Medicine

## 2012-08-05 VITALS — BP 144/86 | Wt 173.0 lb

## 2012-08-05 DIAGNOSIS — O26849 Uterine size-date discrepancy, unspecified trimester: Secondary | ICD-10-CM

## 2012-08-05 DIAGNOSIS — O0993 Supervision of high risk pregnancy, unspecified, third trimester: Secondary | ICD-10-CM

## 2012-08-05 DIAGNOSIS — O09299 Supervision of pregnancy with other poor reproductive or obstetric history, unspecified trimester: Secondary | ICD-10-CM

## 2012-08-05 DIAGNOSIS — O139 Gestational [pregnancy-induced] hypertension without significant proteinuria, unspecified trimester: Secondary | ICD-10-CM

## 2012-08-05 DIAGNOSIS — O4290 Premature rupture of membranes, unspecified as to length of time between rupture and onset of labor, unspecified weeks of gestation: Principal | ICD-10-CM | POA: Diagnosis present

## 2012-08-05 DIAGNOSIS — O133 Gestational [pregnancy-induced] hypertension without significant proteinuria, third trimester: Secondary | ICD-10-CM

## 2012-08-05 LAB — POCT URINALYSIS DIP (DEVICE)
Bilirubin Urine: NEGATIVE
Glucose, UA: NEGATIVE mg/dL
Hgb urine dipstick: NEGATIVE
Ketones, ur: NEGATIVE mg/dL
Nitrite: NEGATIVE
Protein, ur: NEGATIVE mg/dL
Specific Gravity, Urine: 1.015 (ref 1.005–1.030)
Urobilinogen, UA: 0.2 mg/dL (ref 0.0–1.0)
pH: 7 (ref 5.0–8.0)

## 2012-08-05 LAB — COMPREHENSIVE METABOLIC PANEL
ALT: 12 U/L (ref 0–35)
AST: 23 U/L (ref 0–37)
Albumin: 2.6 g/dL — ABNORMAL LOW (ref 3.5–5.2)
Alkaline Phosphatase: 128 U/L — ABNORMAL HIGH (ref 39–117)
BUN: 5 mg/dL — ABNORMAL LOW (ref 6–23)
CO2: 21 mEq/L (ref 19–32)
Calcium: 9.2 mg/dL (ref 8.4–10.5)
Chloride: 103 mEq/L (ref 96–112)
Creatinine, Ser: 0.53 mg/dL (ref 0.50–1.10)
GFR calc Af Amer: >90 mL/min (ref >90)
GFR calc non Af Amer: >90 mL/min (ref >90)
Glucose, Bld: 84 mg/dL (ref 70–99)
Potassium: 4.1 mEq/L (ref 3.5–5.1)
Sodium: 134 mEq/L — ABNORMAL LOW (ref 135–145)
Total Bilirubin: 0.4 mg/dL (ref 0.3–1.2)
Total Protein: 6.6 g/dL (ref 6.0–8.3)

## 2012-08-05 LAB — CBC
HCT: 29.2 % — ABNORMAL LOW (ref 36.0–46.0)
Hemoglobin: 9.5 g/dL — ABNORMAL LOW (ref 12.0–15.0)
MCH: 26 pg (ref 26.0–34.0)
MCHC: 32.5 g/dL (ref 30.0–36.0)
MCV: 79.8 fL (ref 78.0–100.0)
Platelets: 246 10*3/uL (ref 150–400)
RBC: 3.66 MIL/uL — ABNORMAL LOW (ref 3.87–5.11)
RDW: 15.6 % — ABNORMAL HIGH (ref 11.5–15.5)
WBC: 8.6 10*3/uL (ref 4.0–10.5)

## 2012-08-05 LAB — TYPE AND SCREEN
ABO/RH(D): A POS
Antibody Screen: NEGATIVE

## 2012-08-05 LAB — ABO/RH: ABO/RH(D): A POS

## 2012-08-05 LAB — RPR: RPR Ser Ql: NONREACTIVE

## 2012-08-05 MED ORDER — ACETAMINOPHEN 325 MG PO TABS: 650.0000 mg | ORAL_TABLET | ORAL | Status: DC | PRN

## 2012-08-05 MED ORDER — ZOLPIDEM TARTRATE 5 MG PO TABS: 5.0000 mg | ORAL_TABLET | Freq: Every evening | ORAL | Status: DC | PRN

## 2012-08-05 MED ORDER — OXYTOCIN BOLUS FROM INFUSION: 500.0000 mL | INTRAVENOUS | Status: DC

## 2012-08-05 MED ORDER — LACTATED RINGERS IV SOLN
INTRAVENOUS | Status: DC
  Administered 2012-08-05: 17:00:00 via INTRAVENOUS

## 2012-08-05 MED ORDER — MISOPROSTOL 25 MCG QUARTER TABLET
25.0000 ug | ORAL_TABLET | ORAL | Status: DC | PRN
  Administered 2012-08-05: 25 ug via VAGINAL
  Filled 2012-08-05: qty 1
  Filled 2012-08-05: qty 0.25

## 2012-08-05 MED ORDER — LIDOCAINE HCL (PF) 1 % IJ SOLN
30.0000 mL | INTRAMUSCULAR | Status: DC | PRN
  Filled 2012-08-05 (×2): qty 30

## 2012-08-05 MED ORDER — IBUPROFEN 600 MG PO TABS
600.0000 mg | ORAL_TABLET | Freq: Four times a day (QID) | ORAL | Status: DC | PRN
  Administered 2012-08-06: 600 mg via ORAL
  Filled 2012-08-05: qty 1

## 2012-08-05 MED ORDER — OXYTOCIN 40 UNITS IN LACTATED RINGERS INFUSION - SIMPLE MED
62.5000 mL/h | INTRAVENOUS | Status: DC
  Filled 2012-08-05: qty 1000

## 2012-08-05 MED ORDER — CITRIC ACID-SODIUM CITRATE 334-500 MG/5ML PO SOLN: 30.0000 mL | ORAL | Status: DC | PRN

## 2012-08-05 MED ORDER — ONDANSETRON HCL 4 MG/2ML IJ SOLN
4.0000 mg | Freq: Four times a day (QID) | INTRAMUSCULAR | Status: DC | PRN
  Administered 2012-08-06: 4 mg via INTRAVENOUS
  Filled 2012-08-05: qty 2

## 2012-08-05 MED ORDER — LACTATED RINGERS IV SOLN: 500.0000 mL | INTRAVENOUS | Status: DC | PRN

## 2012-08-05 MED ORDER — OXYCODONE-ACETAMINOPHEN 5-325 MG PO TABS: 1.0000 | ORAL_TABLET | ORAL | Status: DC | PRN

## 2012-08-05 NOTE — Patient Instructions (Signed)
Labor Induction  Most women go into labor on their own between 37 and 42 weeks of the pregnancy. When this does not happen or when there is a medical need, medicine or other methods may be used to induce labor. Labor induction causes a pregnant woman's uterus to contract. It also causes the cervix to soften (ripen), open (dilate), and thin out (efface). Usually, labor is not induced before 39 weeks of the pregnancy unless there is a problem with the baby or mother. Whether your labor will be induced depends on a number of factors, including the following:  The medical condition of you and the baby.  How many weeks along you are.  The status of baby's lung maturity.  The condition of the cervix.  The position of the baby. REASONS FOR LABOR INDUCTION  The health of the baby or mother is at risk.  The pregnancy is overdue by 1 week or more.  The water breaks but labor does not start on its own.  The mother has a health condition or serious illness such as high blood pressure, infection, placental abruption, or diabetes.  The amniotic fluid amounts are low around the baby.  The baby is distressed. REASONS TO NOT INDUCE LABOR Labor induction may not be a good idea if:  It is shown that your baby does not tolerate labor.  An induction is just more convenient.  You want the baby to be born on a certain date, like a holiday.  You have had previous surgeries on your uterus, such as a myomectomy or the removal of fibroids.  Your placenta lies very low in the uterus and blocks the opening of the cervix (placenta previa).  Your baby is not in a head down position.  The umbilical cord drops down into the birth canal in front of the baby. This could cut off the baby's blood and oxygen supply.  You have had a previous cesarean delivery.  There areunusual circumstances, such as the baby being extremely premature. RISKS AND COMPLICATIONS Problems may occur in the process of induction  and plans may need to be modified as a situation unfolds. Some of the risks of induction include:  Change in fetal heart rate, such as too high, too low, or erratic.  Risk of fetal distress.  Risk of infection to mother and baby.  Increased chance of having a cesarean delivery.  The rare, but increased chance that the placenta will separate from the uterus (abruption).  Uterine rupture (very rare). When induction is needed for medical reasons, the benefits of induction may outweigh the risks. BEFORE THE PROCEDURE Your caregiver will check your cervix and the baby's position. This will help your caregiver decide if you are far enough along for an induction to work. PROCEDURE Several methods of labor induction may be used, such as:   Taking prostaglandin medicine to dilate and ripen the cervix. The medicine will also start contractions. It can be taken by mouth or by inserting a suppository into the vagina.  A thin tube (catheter) with a balloon on the end may be inserted into your vagina to dilate the cervix. Once inserted, the balloon expands with water, which causes the cervix to open.  Striping the membranes. Your caregiver inserts a finger between the cervix and membranes, which causes the cervix to be stretched and may cause the uterus to contract. This is often done during an office visit. You will be sent home to wait for the contractions to begin. You will   then come in for an induction.  Breaking the water. Your caregiver will make a hole in the amniotic sac using a small instrument. Once the amniotic sac breaks, contractions should begin. This may still take hours to see an effect.  Taking medicine to trigger or strengthen contractions. This medicine is given intravenously through a tube in your arm. All of the methods of induction, besides stripping the membranes, will be done in the hospital. Induction is done in the hospital so that you and the baby can be carefully  monitored. AFTER THE PROCEDURE Some inductions can take up to 2 or 3 days. Depending on the cervix, it usually takes less time. It takes longer when you are induced early in the pregnancy or if this is your first pregnancy. If a mother is still pregnant and the induction has been going on for 2 to 3 days, either the mother will be sent home or a cesarean delivery will be needed. Document Released: 07/24/2006 Document Revised: 05/27/2011 Document Reviewed: 01/07/2011 [facility] Patient Information 2014 [place], [place].  Hypertension During Pregnancy Hypertension is also called high blood pressure. It can occur at any time in life and during pregnancy. When you have hypertension, there is extra pressure inside your blood vessels that carry blood from the heart to the rest of your body (arteries). Hypertension during pregnancy can cause problems for you and your baby. Your baby might not weigh as much as it should at birth or might be born early (premature). Very bad cases of hypertension during pregnancy can be life-threatening.  There are different types of hypertension during pregnancy.   Chronic hypertension. This happens when a woman has hypertension before pregnancy and it continues during pregnancy.  Gestational hypertension. This is when hypertension develops during pregnancy.  Preeclampsia or toxemia of pregnancy. This is a very serious type of hypertension that develops only during pregnancy. It is a disease that affects the whole body (systemic) and can be very dangerous for both mother and baby.  Gestational hypertension and preeclampsia usually go away after your baby is born. Blood pressure generally stabilizes within 6 weeks. Women who have hypertension during pregnancy have a greater chance of developing hypertension later in life or with future pregnancies. UNDERSTANDING BLOOD PRESSURE Blood pressure moves blood in your body. Sometimes, the force that moves the blood becomes too  strong.  A blood pressure reading is given in 2 numbers and looks like a fraction.  The top number is called the systolic pressure. When your heart beats, it forces more blood to flow through the arteries. Pressure inside the arteries goes up.  The bottom number is the diastolic pressure. Pressure goes down between beats. That is when the heart is resting.  You may have hypertension if:  Your systolic blood pressure is above 140.  Your diastolic pressure is above 90. RISK FACTORS Some factors make you more likely to develop hypertension during pregnancy. Risk factors include:  Having hypertension before pregnancy.  Having hypertension during a previous pregnancy.  Being overweight.  Being older than 40.  Being pregnant with more than 1 baby (multiples).  Having diabetes or kidney problems. SYMPTOMS Chronic and gestational hypertension may not cause symptoms. Preeclampsia has symptoms, which may include:  Increased protein in your urine. Your caregiver will check for this at every prenatal visit.  Swelling of your hands and face.  Rapid weight gain.  Headaches.  Visual changes.  Being bothered by light.  Abdominal pain, especially in the right upper area.  Chest pain.  Shortness of breath.  Increased reflexes.  Seizures. Seizures occur with a more severe form of preeclampsia, called eclampsia. DIAGNOSIS   You may be diagnosed with hypertension during pregnancy during a regular prenatal exam. At each visit, tests may include:  Blood pressure checks.  A urine test to check for protein in your urine.  The type of hypertension you are diagnosed with depends on when you developed it. It also depends on your specific blood pressure reading.  Developing hypertension before 20 weeks of pregnancy is consistent with chronic hypertension.  Developing hypertension after 20 weeks of pregnancy is consistent with gestational hypertension.  Hypertension with increased  urinary protein is diagnosed as preeclampsia.  Blood pressure measurements that stay above 160 systolic or 110 diastolic are a sign of severe preeclampsia. TREATMENT Treatment for hypertension during pregnancy varies. Treatment depends on the type of hypertension and how serious it is.  If you take medicine for chronic hypertension, you may need to switch medicines.  Drugs called ACE inhibitors should not be taken during pregnancy.  Low-dose aspirin may be suggested for women who have risk factors for preeclampsia.  If you have gestational hypertension, you may need to take a blood pressure medicine that is safe during pregnancy. Your caregiver will recommend the appropriate medicine.  If you have severe preeclampsia, you may need to be in the hospital. Caregivers will watch you and the baby very closely. You also may need to take medicine (magnesium sulfate) to prevent seizures and lower blood pressure.  Sometimes an early delivery is needed. This may be the case if the condition worsens. It would be done to protect you and the baby. The only cure for preeclampsia is delivery. HOME CARE INSTRUCTIONS  Schedule and keep all of your regular prenatal care.  Follow your caregiver's instructions for taking medicines. Tell your caregiver about all medicines you take. This includes over-the-counter medicines.  Eat as little salt as possible.  Get regular exercise.  Do not drink alcohol.  Do not use tobacco products.  Do not drink products with caffeine.  Lie on your left side when resting.  Tell your doctor if you have any preeclampsia symptoms. SEEK IMMEDIATE MEDICAL CARE IF:  You have severe abdominal pain.  You have sudden swelling in the hands, ankles, or face.  You gain 4 pounds (1.8 kg) or more in 1 week.  You vomit repeatedly.  You have vaginal bleeding.  You do not feel the baby moving as much.  You have a headache.  You have blurred or double vision.  You have  muscle twitching or spasms.  You have shortness of breath.  You have blue fingernails and lips.  You have blood in your urine. MAKE SURE YOU:  Understand these instructions.  Will watch your condition.  Will get help right away if you are not doing well. Document Released: 11/20/2010 Document Revised: 05/27/2011 Document Reviewed: 11/20/2010 [facility] Patient Information 2014 [place], [place].

## 2012-08-05 NOTE — Progress Notes (Signed)
Second high BP. No headache or vision changes. Had preeclampsia last pregnancy. Will induce. L&D and CNM notified. Cervix 1 cm/long/high

## 2012-08-05 NOTE — Progress Notes (Signed)
P = 86   BP re-check = 139/85   Pt c/o swelling of fingers. Had small amt clear fluid leaking from vagina earlier today

## 2012-08-05 NOTE — H&P (Signed)
[name] is a 22 y.o. female presenting for induction of labor for gestational hypertension.  No PreX symptoms.   Maternal Medical History:  Fetal activity: Perceived fetal activity is normal.   Last perceived fetal movement was within the past hour.    Prenatal complications: PIH.     OB History   Grav Para Term Preterm Abortions TAB SAB Ect Mult Living   2 1 1  0 0 0 0 0 0 1     Past Medical History  Diagnosis Date  . Anemia   . Scoliosis   . Heart murmur   . Late prenatal care   . Gestational hypertension    Past Surgical History  Procedure Laterality Date  . No past surgeries     Family History: family history includes Anemia in her mother; Diabetes in her maternal grandmother; Heart attack in her maternal grandmother; Heart disease in her maternal grandmother; Heart murmur in her brother; Hyperlipidemia in her maternal grandmother; Hypertension in her maternal grandmother; Migraines in her mother; and Mitral valve prolapse in her maternal grandmother and sister. Social History:  reports that she has never smoked. She has never used smokeless tobacco. She reports that she does not drink alcohol or use illicit drugs.   Prenatal Transfer Tool  Maternal Diabetes: No Genetic Screening: Declined Maternal Ultrasounds/Referrals: Normal Fetal Ultrasounds or other Referrals:  None Maternal Substance Abuse:  No Significant Maternal Medications:  None Significant Maternal Lab Results:  Lab values include: Group B Strep negative Other Comments:  None  Review of Systems  Gastrointestinal: Positive for abdominal pain (braxton hicks).  All other systems reviewed and are negative.    Exam by::  (Bedside Ultrasound by Rhunette Croft, CNM) Blood pressure 142/87, pulse 71, temperature 98.5 F (36.9 C), temperature source Oral, resp. rate 18, height 5\' 7"  (1.702 m), weight 78.472 kg (173 lb). Maternal Exam:  Uterine Assessment: Contraction strength is mild.  Abdomen: Estimated  fetal weight is 7-7.5 lbs.   Fetal presentation: vertex  Introitus: Normal vulva. Normal vagina.  Vaginal discharge: mucusy.    Fetal Exam Fetal Monitor Review: Baseline rate: 130's.  Pattern: accelerations present.    Fetal State Assessment: Category I - tracings are normal.     Physical Exam  Constitutional: She is oriented to person, place, and time. She appears well-developed and well-nourished. No distress.  HENT:  Head: Normocephalic.  Neck: Normal range of motion. Neck supple.  Cardiovascular: Normal rate, regular rhythm and normal heart sounds.   Respiratory: Effort normal and breath sounds normal. No respiratory distress.  GI: Soft. There is no tenderness.  Genitourinary: No bleeding around the vagina. Vaginal discharge: mucusy.  Musculoskeletal: Normal range of motion. Edema: +1 bilat pedal.  Neurological: She is alert and oriented to person, place, and time. She displays normal reflexes.  Skin: Skin is warm and dry.   Prenatal labs: ABO, Rh: --/--/A POS (05/21 1640) Antibody: PENDING (05/21 1640) Rubella: 6.72 (03/12 1252) RPR: NON REAC (03/12 1252)  HBsAg: NEGATIVE (03/12 1252)  HIV: NON REACTIVE (03/12 1252)  GBS: Negative (04/30 0000)   Assessment/Plan: G2P1001 at 39.2 wks IUP Gestational Hypertension GBS negative  Plan: Admit to [facility] Cytotec for ripening of cervix CBC/CMP pending Reassess prn   [facility] 08/05/2012, 6:31 PM

## 2012-08-06 ENCOUNTER — Encounter (HOSPITAL_COMMUNITY): Payer: Self-pay | Admitting: *Deleted

## 2012-08-06 DIAGNOSIS — O139 Gestational [pregnancy-induced] hypertension without significant proteinuria, unspecified trimester: Secondary | ICD-10-CM

## 2012-08-06 DIAGNOSIS — O4290 Premature rupture of membranes, unspecified as to length of time between rupture and onset of labor, unspecified weeks of gestation: Secondary | ICD-10-CM

## 2012-08-06 MED ORDER — OXYCODONE-ACETAMINOPHEN 5-325 MG PO TABS: 1.0000 | ORAL_TABLET | ORAL | Status: DC | PRN

## 2012-08-06 MED ORDER — DIPHENHYDRAMINE HCL 25 MG PO CAPS: 25.0000 mg | ORAL_CAPSULE | Freq: Four times a day (QID) | ORAL | Status: DC | PRN

## 2012-08-06 MED ORDER — SIMETHICONE 80 MG PO CHEW
80.0000 mg | CHEWABLE_TABLET | ORAL | Status: DC | PRN
  Administered 2012-08-06: 80 mg via ORAL

## 2012-08-06 MED ORDER — LANOLIN HYDROUS EX OINT: TOPICAL_OINTMENT | CUTANEOUS | Status: DC | PRN

## 2012-08-06 MED ORDER — IBUPROFEN 600 MG PO TABS
600.0000 mg | ORAL_TABLET | Freq: Four times a day (QID) | ORAL | Status: DC
  Administered 2012-08-06: 600 mg via ORAL
  Filled 2012-08-06 (×2): qty 1

## 2012-08-06 MED ORDER — PRENATAL MULTIVITAMIN CH: 1.0000 | ORAL_TABLET | Freq: Every day | ORAL | Status: DC

## 2012-08-06 MED ORDER — WITCH HAZEL-GLYCERIN EX PADS: 1.0000 "application " | MEDICATED_PAD | CUTANEOUS | Status: DC | PRN

## 2012-08-06 MED ORDER — ONDANSETRON HCL 4 MG/2ML IJ SOLN: 4.0000 mg | INTRAMUSCULAR | Status: DC | PRN

## 2012-08-06 MED ORDER — TETANUS-DIPHTH-ACELL PERTUSSIS 5-2.5-18.5 LF-MCG/0.5 IM SUSP
0.5000 mL | Freq: Once | INTRAMUSCULAR | Status: AC
  Administered 2012-08-07: 0.5 mL via INTRAMUSCULAR

## 2012-08-06 MED ORDER — ZOLPIDEM TARTRATE 5 MG PO TABS: 5.0000 mg | ORAL_TABLET | Freq: Every evening | ORAL | Status: DC | PRN

## 2012-08-06 MED ORDER — SENNOSIDES-DOCUSATE SODIUM 8.6-50 MG PO TABS
2.0000 | ORAL_TABLET | Freq: Every day | ORAL | Status: DC
  Administered 2012-08-06: 2 via ORAL

## 2012-08-06 MED ORDER — BENZOCAINE-MENTHOL 20-0.5 % EX AERO: 1.0000 "application " | INHALATION_SPRAY | CUTANEOUS | Status: DC | PRN

## 2012-08-06 MED ORDER — ONDANSETRON HCL 4 MG PO TABS: 4.0000 mg | ORAL_TABLET | ORAL | Status: DC | PRN

## 2012-08-06 MED ORDER — DIBUCAINE 1 % RE OINT: 1.0000 "application " | TOPICAL_OINTMENT | RECTAL | Status: DC | PRN

## 2012-08-06 NOTE — Progress Notes (Signed)
  Subjective: Pt is sleeping.   Objective: BP 135/71  Pulse 80  Temp(Src) 98.5 F (36.9 C) (Oral)  Resp 18  Ht 5\' 7"  (1.702 m)  Wt 78.472 kg (173 lb)  BMI 27.09 kg/m2      FHT:  FHR: 140's bpm, variability: moderate,  accelerations:  Abscent,  decelerations:  Absent UC:   regular, every q 2 minutes on [employer]  SVE:   Dilation: 1 Exam by::  (Bedside Ultrasound by Rhunette Croft, CNM)  Labs: Lab Results  Component Value Date   WBC 8.6 08/05/2012   HGB 9.5* 08/05/2012   HCT 29.2* 08/05/2012   MCV 79.8 08/05/2012   PLT 246 08/05/2012    Assessment / Plan:  Labor: IOL for GHTN Preeclampsia:  no signs or symptoms of toxicity Fetal Wellbeing:  Category I Pain Control:  Labor support without medications I/D:  n/a Anticipated MOD:  NSVD  Lashina Milles A 08/06/2012, 12:34 AM

## 2012-08-06 NOTE — Progress Notes (Signed)
I have seen and examined this patient and I agree with the above. SHAW, KIMBERLY 2:50 AM 08/06/2012    

## 2012-08-06 NOTE — Progress Notes (Signed)
UR chart review completed.  

## 2012-08-06 NOTE — Progress Notes (Signed)
[name] is a 22 y.o. G2P1001 at [redacted]w[redacted]d   Subjective: Feeling pressure and inc pain with ctx  Objective: BP 135/71  Pulse 80  Temp(Src) 98.5 F (36.9 C) (Oral)  Resp 18  Ht 5\' 7"  (1.702 m)  Wt 173 lb (78.472 kg)  BMI 27.09 kg/m2      FHT:  FHR: 140s bpm, variability: moderate,  accelerations:  Present,  decelerations:  Absent UC:   regular, every 2-4 minutes, spontaneous SVE:   Dilation: 9 Station: 0 Exam by:: Clelia Croft, CNM SROM with exam for clear fluid  Labs: Lab Results  Component Value Date   WBC 8.6 08/05/2012   HGB 9.5* 08/05/2012   HCT 29.2* 08/05/2012   MCV 79.8 08/05/2012   PLT 246 08/05/2012    Assessment / Plan: IUP 39.3wks Active labor/transition after cytotec x 1 GHTN with stable BPs  Await urge to push  SHAW, KIMBERLY 08/06/2012, 1:12 AM

## 2012-08-07 MED ORDER — FERROUS SULFATE 325 (65 FE) MG PO TABS: 325.0000 mg | ORAL_TABLET | Freq: Two times a day (BID) | ORAL | Status: DC

## 2012-08-07 MED ORDER — DOCUSATE SODIUM 100 MG PO CAPS: 100.0000 mg | ORAL_CAPSULE | Freq: Every day | ORAL | Status: DC

## 2012-08-07 MED ORDER — IBUPROFEN 600 MG PO TABS: 600.0000 mg | ORAL_TABLET | Freq: Four times a day (QID) | ORAL | Status: DC

## 2012-08-07 NOTE — Discharge Summary (Signed)
Obstetric Discharge Summary Reason for Admission: onset of labor and induction of labor Prenatal Procedures: ultrasound Intrapartum Procedures: spontaneous vaginal delivery Postpartum Procedures: none Complications-Operative and Postpartum: none  [name] is a 22 y.o. female G2P2002 who presented to the MAU with PROM at [redacted]w[redacted]d who had SVD on 5/22 at 0130. She denies dizziness upon standing. She has been tolerating food and drink and has been making urine, bowel movements, and passing flatus regularly. She plans to breast feed exclusively. She is planning to get the Nexplanon for her birth control needs and will be followed by the [facility] downstairs for her postpartum OBGYN needs (appointment 6/25). She is planning on making an appointment with [facility] for the circumcision for her son.    Hemoglobin  Date Value Range Status  08/05/2012 9.5* 12.0 - 15.0 g/dL Final  4/0/9811 91.4* 78.2 - 16.2 g/dL Final     HCT  Date Value Range Status  08/05/2012 29.2* 36.0 - 46.0 % Final    Physical Exam:  General: alert, cooperative and no distress CVS: RRR, good S1 and S2, no RGM  Lungs: CTA bilaterally, no wheeze, rales, or rhonchi  ABD: Normoactive bowel sounds x4 quadrants, soft, non-tender, non-distended Lochia: appropriate Uterine Fundus: firm DVT Evaluation: No evidence of DVT seen on physical exam. Negative Homan's sign.  Discharge Diagnoses: Term Pregnancy-delivered  Discharge Information: Date: 08/07/2012 Activity: pelvic rest Diet: routine Medications: PNV, Ibuprofen, Colace and Iron Condition: stable Instructions: refer to practice specific booklet Discharge to: home   Newborn Data: Live born female  Birth Weight: 6 lb 14.4 oz (3130 g) APGAR: 8, 9  Home with mother.  [name] 08/07/2012, 7:55 AM  I have seen and examined this patient and agree the above assessment. [name],[name] 08/08/2012 3:59 PM

## 2012-08-07 NOTE — Clinical Social Work Maternal (Signed)
    Clinical Social Work Department PSYCHOSOCIAL ASSESSMENT - MATERNAL/CHILD 08/07/2012  Patient:  DONETTA, ISAZA  Account Number:  [id number]  Admit Date:  08/05/2012  Marjo Bicker Name:   Lars Pinks    Clinical Social Worker:  Nobie Putnam, LCSW   Date/Time:  08/07/2012 10:49 AM  Date Referred:  08/07/2012   Referral source  CN     Referred reason  [facility]   Other referral source:    I:  FAMILY / HOME ENVIRONMENT Child's legal guardian:  PARENT  Guardian - Name Guardian - Age Guardian - Address  Jewelle Whitner [address] [place]; [place], [place] [address]  Jerilynn Birkenhead 19 (same as above)   Other household support members/support persons Name Relationship DOB  Fransheska Willingham DAUGHTER 03/20/11   Other support:    II  PSYCHOSOCIAL DATA Information Source:  Patient Interview  Event organiser Employment:   Financial resources:  Self Pay If Medicaid - [employer]:   Building services engineer / Grade:   Maternity Care Coordinator / Child Services Coordination / Early Interventions:   Yes  Cultural issues impacting care:    III  STRENGTHS Strengths  Adequate Resources  Home prepared for Child (including basic supplies)  Supportive family/friends   Strength comment:    IV  RISK FACTORS AND CURRENT PROBLEMS Current Problem:  YES   Risk Factor & Current Problem Patient Issue Family Issue Risk Factor / Current Problem Comment  Other - See comment [facility] @ 31 weeks    V  SOCIAL WORK ASSESSMENT CSW referral received to assess reason for [facility] @ 31 weeks. Pt told CSW that she didn't find out about pregnancy at 5 months.  Once she learned about pregnancy she established [facility].  She denies illegal substance use & verbalized understanding of hospital drug testing policy.  UDS is negative, meconium results are pending.  She has all the necessary supplies for the infant & appears to be appropriate.  Pt's spouse is supportive & involved.  CSW will continue to monitor  drug screen results & make a referral if needed.      VI SOCIAL WORK PLAN Social Work Plan  No Further Intervention Required / No Barriers to Discharge   Type of pt/family education:   If child protective services report - county:   If child protective services report - date:   Information/referral to community resources comment:   Other social work plan:

## 2012-08-08 NOTE — Discharge Summary (Signed)
Attestation of Attending Supervision of Advanced Practitioner (PA/CNM/NP): Evaluation and management procedures were performed by the Advanced Practitioner under my supervision and collaboration.  I have reviewed the Advanced Practitioner's note and chart, and I agree with the management and plan.  Zayah Keilman, MD, FACOG Attending Obstetrician & Gynecologist Faculty Practice, [facility]  

## 2012-08-13 NOTE — Progress Notes (Signed)
Not sent. Cancel.

## 2012-09-09 ENCOUNTER — Encounter: Payer: Self-pay | Admitting: Advanced Practice Midwife

## 2012-09-09 ENCOUNTER — Ambulatory Visit (INDEPENDENT_AMBULATORY_CARE_PROVIDER_SITE_OTHER): Payer: Medicaid Other | Admitting: Advanced Practice Midwife

## 2012-09-09 VITALS — BP 148/87 | HR 86 | Temp 96.7°F | Ht 66.0 in | Wt 149.0 lb

## 2012-09-09 DIAGNOSIS — IMO0002 Reserved for concepts with insufficient information to code with codable children: Secondary | ICD-10-CM

## 2012-09-09 DIAGNOSIS — R87612 Low grade squamous intraepithelial lesion on cytologic smear of cervix (LGSIL): Secondary | ICD-10-CM | POA: Insufficient documentation

## 2012-09-09 NOTE — Progress Notes (Signed)
  Subjective:     [name] is a 22 y.o. female who presents for a postpartum visit. She is 6 weeks postpartum following a spontaneous vaginal delivery. I have fully reviewed the prenatal and intrapartum course. The delivery was at Term. Outcome: spontaneous vaginal delivery. Anesthesia: none. Postpartum course has been uneventful. Baby's course has been uneventful. Baby is feeding by breast. Bleeding thin lochia. Bowel function is normal. Bladder function is normal. Patient is not sexually active. Contraception method is Nexplanon. Postpartum depression screening: negative.  The following portions of the patient's history were reviewed and updated as appropriate: allergies, current medications, past family history, past medical history, past social history, past surgical history and problem list.  Review of Systems Pertinent items are noted in HPI.   Objective:    BP 148/87  Pulse 86  Temp(Src) 96.7 F (35.9 C) (Oral)  Ht 5\' 6"  (1.676 m)  Wt 67.586 kg (149 lb)  BMI 24.06 kg/m2  Breastfeeding? No  General:  alert, cooperative and no distress   Breasts:  inspection negative, no nipple discharge or bleeding, no masses or nodularity palpable  Lungs: clear to auscultation bilaterally  Heart:  regular rate and rhythm, S1, S2 normal, no murmur, click, rub or gallop  Abdomen: soft, non-tender; bowel sounds normal; no masses,  no organomegaly   Vulva:  normal  Vagina: normal vagina light lochia  Cervix:  multiparous appearance and no lesions  Corpus: normal size, contour, position, consistency, mobility, non-tender  Adnexa:  no mass, fullness, tenderness  Rectal Exam: Not performed.        Assessment:     Normal postpartum exam. Pap smear not done at today's visit.   Plan:    1. Contraception: Nexplanon 2. Discussed LGSIL pap with Dr Debroah Loop and patient. 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  Guidelines suggest colposcopy. Will schedule that.  3. Follow up in: 1 year or as needed.

## 2012-10-07 ENCOUNTER — Encounter: Payer: Self-pay | Admitting: Obstetrics & Gynecology

## 2012-10-07 ENCOUNTER — Ambulatory Visit (INDEPENDENT_AMBULATORY_CARE_PROVIDER_SITE_OTHER): Payer: BC Managed Care – PPO | Admitting: Obstetrics & Gynecology

## 2012-10-07 VITALS — BP 147/84 | HR 67 | Temp 97.3°F | Resp 20 | Ht 67.0 in | Wt 146.2 lb

## 2012-10-07 DIAGNOSIS — Z01812 Encounter for preprocedural laboratory examination: Secondary | ICD-10-CM

## 2012-10-07 DIAGNOSIS — Z30017 Encounter for initial prescription of implantable subdermal contraceptive: Secondary | ICD-10-CM

## 2012-10-07 DIAGNOSIS — IMO0001 Reserved for inherently not codable concepts without codable children: Secondary | ICD-10-CM

## 2012-10-07 DIAGNOSIS — IMO0002 Reserved for concepts with insufficient information to code with codable children: Secondary | ICD-10-CM

## 2012-10-07 DIAGNOSIS — R6889 Other general symptoms and signs: Secondary | ICD-10-CM

## 2012-10-07 MED ORDER — ETONOGESTREL 68 MG ~~LOC~~ IMPL
68.0000 mg | DRUG_IMPLANT | Freq: Once | SUBCUTANEOUS | Status: AC
  Administered 2012-10-07: 68 mg via SUBCUTANEOUS

## 2012-10-07 NOTE — Progress Notes (Signed)
GYNECOLOGY CLINIC PROCEDURE NOTE  [name] is a 22 y.o. [id number] here for Nexplanon insertion. No GYN concerns.  Last pap smear was on 06/08/12 was LSIL; she needs a repeat pap in 05/2013.    Nexplanon Insertion Procedure Patient given informed consent, she signed consent form.  Patient does understand that irregular bleeding is a very common side effect of this medication. Pregnancy test was negative.  Appropriate time out taken.  Patient's left arm was prepped and draped in the usual sterile fashion.. The ruler used to measure and mark insertion area.  Patient was prepped with alcohol swab and then injected with 3 ml of 1% lidocaine.  She was prepped with betadine, Nexplanon removed from packaging,  Device confirmed in needle, then inserted full length of needle and withdrawn per handbook instructions. Nexplanon was able to palpated in the patient's arm; patient palpated the insert herself. There was minimal blood loss.  Patient insertion site covered with guaze and a pressure bandage to reduce any bruising.    The patient tolerated the procedure well and was given post procedure instructions.   Jaynie Collins, MD, FACOG Attending Obstetrician & Gynecologist Faculty Practice, [facility]

## 2012-10-07 NOTE — Patient Instructions (Signed)
Return to clinic for any obstetric concerns or go to MAU for evaluation  

## 2012-11-12 ENCOUNTER — Emergency Department (HOSPITAL_COMMUNITY)
Admission: EM | Admit: 2012-11-12 | Discharge: 2012-11-12 | Disposition: A | Discharge status: Home / Self Care | Payer: BC Managed Care – PPO | Source: Home / Self Care | Attending: Family Medicine | Admitting: Family Medicine

## 2012-11-12 ENCOUNTER — Encounter (HOSPITAL_COMMUNITY): Payer: Self-pay | Admitting: Emergency Medicine

## 2012-11-12 DIAGNOSIS — R21 Rash and other nonspecific skin eruption: Secondary | ICD-10-CM

## 2012-11-12 MED ORDER — CETIRIZINE HCL 10 MG PO TABS: 10.0000 mg | ORAL_TABLET | Freq: Every evening | ORAL | Status: DC | PRN

## 2012-11-12 MED ORDER — TRIAMCINOLONE ACETONIDE 0.1 % EX CREA: TOPICAL_CREAM | Freq: Two times a day (BID) | CUTANEOUS | Status: DC

## 2012-11-12 NOTE — ED Provider Notes (Signed)
Medical screening examination/treatment/procedure(s) were performed by resident physician or non-physician practitioner and as supervising physician I was immediately available for consultation/collaboration.   KINDL,JAMES DOUGLAS MD.   James D Kindl, MD 11/12/12 2054 

## 2012-11-12 NOTE — ED Provider Notes (Signed)
CSN: [id number]     Arrival date & time 11/12/12  1520 History   First MD Initiated Contact with Patient 11/12/12 1634     Chief Complaint  Patient presents with  . Rash   (Consider location/radiation/quality/duration/timing/severity/associated sxs/prior Treatment) HPI Comments: Patient presents complaining of rash for one week. This started as itchy red bumps on her wrists which has started to resolve but now she also has itchy red bumps on her ankles. She thinks she may have been bitten by some sort of blood. There no contacts in the home with similar rash. She does not recall every having this before. She denies pain associated with the rash or any other systemic symptoms  Patient is a 22 y.o. female presenting with rash.  Rash Associated symptoms: no chest pain, no chills, no cough, no dysuria, no fever, no nausea, no shortness of breath and no vomiting     Past Medical History  Diagnosis Date  . Anemia   . Scoliosis   . Heart murmur   . Late prenatal care   . Gestational hypertension    Past Surgical History  Procedure Laterality Date  . No past surgeries     Family History  Problem Relation Age of Onset  . Anemia Mother   . Migraines Mother   . Mitral valve prolapse Sister   . Heart murmur Brother   . Heart attack Maternal Grandmother   . Mitral valve prolapse Maternal Grandmother   . Heart disease Maternal Grandmother   . Diabetes Maternal Grandmother   . Hyperlipidemia Maternal Grandmother   . Hypertension Maternal Grandmother    History  Substance Use Topics  . Smoking status: Never Smoker   . Smokeless tobacco: Never Used  . Alcohol Use: No   OB History   Grav Para Term Preterm Abortions TAB SAB Ect Mult Living   2 2 2  0 0 0 0 0 0 2     Review of Systems  Constitutional: Negative for fever and chills.  Eyes: Negative for visual disturbance.  Respiratory: Negative for cough and shortness of breath.   Cardiovascular: Negative for chest pain, palpitations  and leg swelling.  Gastrointestinal: Negative for nausea, vomiting and abdominal pain.  Endocrine: Negative for polydipsia and polyuria.  Genitourinary: Negative for dysuria, urgency and frequency.  Musculoskeletal: Negative for myalgias and arthralgias.  Skin: Positive for rash.  Neurological: Negative for dizziness, weakness and light-headedness.    Allergies  Review of patient's allergies indicates no known allergies.  Home Medications   Current Outpatient Rx  Name  Route  Sig  Dispense  Refill  . calcium carbonate (TUMS) 500 MG chewable tablet   Oral   Chew 1-2 tablets by mouth 2 (two) times daily as needed for heartburn.         . cetirizine (ZYRTEC) 10 MG tablet   Oral   Take 1 tablet (10 mg total) by mouth at bedtime as needed for allergies.   30 tablet   0   . docusate sodium (COLACE) 100 MG capsule   Oral   Take 1 capsule (100 mg total) by mouth daily.   30 capsule   2   . ferrous sulfate (FERROUSUL) 325 (65 FE) MG tablet   Oral   Take 1 tablet (325 mg total) by mouth 2 (two) times daily.   60 tablet   3   . ibuprofen (ADVIL,MOTRIN) 600 MG tablet   Oral   Take 1 tablet (600 mg total) by mouth every 6 (  six) hours.   30 tablet   1   . prenatal vitamin w/FE, FA (NATACHEW) 29-1 MG CHEW   Oral   Chew 2 tablets by mouth daily at 12 noon.          . triamcinolone cream (KENALOG) 0.1 %   Topical   Apply topically 2 (two) times daily.   85 g   0    BP 130/81  Pulse 85  Temp(Src) 98.6 F (37 C) (Oral)  Resp 20  SpO2 97%  LMP 11/12/2012  Breastfeeding? Yes Physical Exam  Nursing note and vitals reviewed. Constitutional: She is oriented to person, place, and time. She appears well-developed and well-nourished. No distress.  HENT:  Head: Normocephalic and atraumatic.  Pulmonary/Chest: Effort normal.  Neurological: She is alert and oriented to person, place, and time. Coordination normal.  Skin: Skin is warm. Rash noted. Rash is papular (discrete  erythematous papules on the distal arms and legs). She is not diaphoretic.  Psychiatric: She has a normal mood and affect. Judgment normal.    ED Course  Procedures (including critical care time) Labs Review Labs Reviewed - No data to display Imaging Review No results found.  MDM   1. Rash    This appears to be an insect bite of some sort. Treat with daily Zyrtec and Kenalog cream. Followup if not improving   Meds ordered this encounter  Medications  . cetirizine (ZYRTEC) 10 MG tablet    Sig: Take 1 tablet (10 mg total) by mouth at bedtime as needed for allergies.    Dispense:  30 tablet    Refill:  0  . triamcinolone cream (KENALOG) 0.1 %    Sig: Apply topically 2 (two) times daily.    Dispense:  85 g    Refill:  0       Graylon Good, PA-C 11/12/12 1651

## 2012-11-12 NOTE — ED Notes (Signed)
Pt c/o rash on legs and arms onset 1 week Sxs include: itchiness Denies: fevers, new hygiene products, meds, foods Alert w/no signs of acute distress.

## 2013-02-15 ENCOUNTER — Encounter: Payer: Self-pay | Admitting: *Deleted

## 2013-02-21 ENCOUNTER — Encounter (HOSPITAL_COMMUNITY): Payer: Self-pay | Admitting: Emergency Medicine

## 2013-02-21 ENCOUNTER — Emergency Department (HOSPITAL_COMMUNITY)
Admission: EM | Admit: 2013-02-21 | Discharge: 2013-02-21 | Disposition: A | Discharge status: Home / Self Care | Payer: BC Managed Care – PPO | Attending: Emergency Medicine | Admitting: Emergency Medicine

## 2013-02-21 DIAGNOSIS — N949 Unspecified condition associated with female genital organs and menstrual cycle: Secondary | ICD-10-CM | POA: Insufficient documentation

## 2013-02-21 DIAGNOSIS — R011 Cardiac murmur, unspecified: Secondary | ICD-10-CM | POA: Insufficient documentation

## 2013-02-21 DIAGNOSIS — R079 Chest pain, unspecified: Secondary | ICD-10-CM | POA: Insufficient documentation

## 2013-02-21 DIAGNOSIS — N898 Other specified noninflammatory disorders of vagina: Secondary | ICD-10-CM | POA: Insufficient documentation

## 2013-02-21 DIAGNOSIS — Z3202 Encounter for pregnancy test, result negative: Secondary | ICD-10-CM | POA: Insufficient documentation

## 2013-02-21 DIAGNOSIS — N939 Abnormal uterine and vaginal bleeding, unspecified: Secondary | ICD-10-CM

## 2013-02-21 DIAGNOSIS — Z8739 Personal history of other diseases of the musculoskeletal system and connective tissue: Secondary | ICD-10-CM | POA: Insufficient documentation

## 2013-02-21 DIAGNOSIS — Z862 Personal history of diseases of the blood and blood-forming organs and certain disorders involving the immune mechanism: Secondary | ICD-10-CM | POA: Insufficient documentation

## 2013-02-21 LAB — POCT I-STAT, CHEM 8
BUN: 8 mg/dL (ref 6–23)
Calcium, Ion: 1.21 mmol/L (ref 1.12–1.23)
Chloride: 101 mEq/L (ref 96–112)
Creatinine, Ser: 0.8 mg/dL (ref 0.50–1.10)
Glucose, Bld: 83 mg/dL (ref 70–99)
HCT: 41 % (ref 36.0–46.0)
Hemoglobin: 13.9 g/dL (ref 12.0–15.0)
Potassium: 3.5 mEq/L (ref 3.5–5.1)
Sodium: 141 mEq/L (ref 135–145)
TCO2: 25 mmol/L (ref 0–100)

## 2013-02-21 LAB — WET PREP, GENITAL
Clue Cells Wet Prep HPF POC: NONE SEEN
Trich, Wet Prep: NONE SEEN
Yeast Wet Prep HPF POC: NONE SEEN

## 2013-02-21 LAB — POCT PREGNANCY, URINE: Preg Test, Ur: NEGATIVE

## 2013-02-21 NOTE — ED Provider Notes (Signed)
CSN: [id number]     Arrival date & time 02/21/13  1051 History   First MD Initiated Contact with Patient 02/21/13 1130     Chief Complaint  Patient presents with  . Vaginal Bleeding  . Chest Pain   (Consider location/radiation/quality/duration/timing/severity/associated sxs/prior Treatment) HPI Comments: Ms Tarnow is a 22 year old G2P2002 presents with a 2 day history of heavy vaginal bleeding.  Bright red blood with some clots similar to menses.  Associated symptoms include lower abdominal cramping and low back discomfort.  She has not tried anything for the discomfort.  She denies dyspareunia, N/V, chance of pregnancy or concern for STD.  She had an implanon placed six months ago and reports irregular periods since that time.  She cannot remember her FDLMP but says her last menstrual cycle was in September.  She also reports having experienced intermittent CP which last occurred 1 month ago.  It was brief and non-exertional.  She denies CP/SOB at present.     Past Medical History  Diagnosis Date  . Anemia   . Scoliosis   . Heart murmur   . Late prenatal care   . Gestational hypertension    Past Surgical History  Procedure Laterality Date  . No past surgeries     Family History  Problem Relation Age of Onset  . Anemia Mother   . Migraines Mother   . Mitral valve prolapse Sister   . Heart murmur Brother   . Heart attack Maternal Grandmother   . Mitral valve prolapse Maternal Grandmother   . Heart disease Maternal Grandmother   . Diabetes Maternal Grandmother   . Hyperlipidemia Maternal Grandmother   . Hypertension Maternal Grandmother    History  Substance Use Topics  . Smoking status: Never Smoker   . Smokeless tobacco: Never Used  . Alcohol Use: No   OB History   Grav Para Term Preterm Abortions TAB SAB Ect Mult Living   2 2 2  0 0 0 0 0 0 2     Review of Systems  Constitutional: Negative for appetite change.  Respiratory: Negative for shortness of breath.    Cardiovascular: Negative for chest pain and leg swelling.  Gastrointestinal: Negative for nausea, vomiting, abdominal pain, diarrhea and constipation.  Genitourinary: Positive for vaginal bleeding and pelvic pain. Negative for dysuria, frequency, hematuria and dyspareunia.    Allergies  Review of patient's allergies indicates no known allergies.  Home Medications  No current outpatient prescriptions on file. BP 125/77  Pulse 68  Temp(Src) 97.8 F (36.6 C) (Oral)  Resp 16  SpO2 100%  Breastfeeding? No Physical Exam  Constitutional: She is oriented to person, place, and time. She appears well-developed and well-nourished. No distress.  HENT:  Head: Normocephalic and atraumatic.  Mouth/Throat: Oropharynx is clear and moist. No oropharyngeal exudate.  Eyes: EOM are normal. Pupils are equal, round, and reactive to light. No scleral icterus.  Cardiovascular: Normal rate, regular rhythm and normal heart sounds.   Pulmonary/Chest: Breath sounds normal. No respiratory distress. She has no wheezes. She has no rales.  Abdominal: Soft. Bowel sounds are normal. She exhibits no distension. There is no tenderness.  Genitourinary: Vagina normal. No vaginal discharge found.  + bright red blood present in the vaginal canal; no excessive blood loss noted; no abnormalities appreciated on bimanual exam.  Musculoskeletal: Normal range of motion. She exhibits no edema and no tenderness.  Neurological: She is alert and oriented to person, place, and time. No cranial nerve deficit.  Skin: Skin  is warm. She is not diaphoretic.  Psychiatric: She has a normal mood and affect. Her behavior is normal.    ED Course  Procedures (including critical care time) Labs Review Labs Reviewed  WET PREP, GENITAL - Abnormal; Notable for the following:    WBC, Wet Prep HPF POC FEW (*)    All other components within normal limits  GC/CHLAMYDIA PROBE AMP  POCT I-STAT, CHEM 8  POCT PREGNANCY, URINE   Imaging  Review No results found.  EKG Interpretation   None       MDM  22 year old woman with menorrhagia and implanon.  Check H&H, upreg, pelvic exam.  H&H normal, negative upreg.  Pelvic exam revealed moderate amount of blood consistent with menses.  No CVAT, discharge or lesions noted.   No CP at present, vitals stable and EKG without acute ischemic changes.  She is hemodynamically stable and can be discharged to home.  Her irregular and heavy menses are likely due to implanon.  She has been instructed to follow-up with [facility] for further management.  Evelena Peat, DO 02/21/13 1429

## 2013-02-21 NOTE — ED Notes (Addendum)
Patient arrived via [facility]. Patient accompanied by husband. Patient states she has been having vaginal bleeding with clots since Friday. Patient reports changing sanitary napkin only when going to bathroom, which is she states is about every 2-3 hours. Patient reports this is the first time this has occurred. [facility] Patient denies N/V, dizziness, or HA.  Patient reports SOB when walking at time, but not as this time. Patient AXOX4 and vitals stable.

## 2013-02-22 LAB — GC/CHLAMYDIA PROBE AMP
CT Probe RNA: NEGATIVE
GC Probe RNA: NEGATIVE

## 2013-02-25 NOTE — ED Provider Notes (Signed)
I saw and evaluated the patient, reviewed the resident's note and I agree with the findings and plan.   .Face to face Exam:  General:  Awake HEENT:  Atraumatic Resp:  Normal effort Abd:  Nondistended Neuro:No focal weakness   Nelia Shi, MD 02/25/13 1304

## 2013-04-08 ENCOUNTER — Encounter (HOSPITAL_COMMUNITY): Payer: Self-pay | Admitting: *Deleted

## 2013-04-08 ENCOUNTER — Inpatient Hospital Stay (HOSPITAL_COMMUNITY)
Admission: AD | Admit: 2013-04-08 | Discharge: 2013-04-08 | Disposition: A | Discharge status: Home / Self Care | Payer: BC Managed Care – PPO | Source: Ambulatory Visit | Attending: Obstetrics & Gynecology | Admitting: Obstetrics & Gynecology

## 2013-04-08 DIAGNOSIS — Z975 Presence of (intrauterine) contraceptive device: Secondary | ICD-10-CM

## 2013-04-08 DIAGNOSIS — Z309 Encounter for contraceptive management, unspecified: Secondary | ICD-10-CM

## 2013-04-08 DIAGNOSIS — N949 Unspecified condition associated with female genital organs and menstrual cycle: Secondary | ICD-10-CM | POA: Insufficient documentation

## 2013-04-08 DIAGNOSIS — N938 Other specified abnormal uterine and vaginal bleeding: Secondary | ICD-10-CM | POA: Insufficient documentation

## 2013-04-08 DIAGNOSIS — N921 Excessive and frequent menstruation with irregular cycle: Secondary | ICD-10-CM

## 2013-04-08 LAB — POCT PREGNANCY, URINE: Preg Test, Ur: NEGATIVE

## 2013-04-08 LAB — CBC
HCT: 33.7 % — ABNORMAL LOW (ref 36.0–46.0)
Hemoglobin: 10.8 g/dL — ABNORMAL LOW (ref 12.0–15.0)
MCH: 25.1 pg — ABNORMAL LOW (ref 26.0–34.0)
MCHC: 32 g/dL (ref 30.0–36.0)
MCV: 78.2 fL (ref 78.0–100.0)
Platelets: 270 10*3/uL (ref 150–400)
RBC: 4.31 MIL/uL (ref 3.87–5.11)
RDW: 15 % (ref 11.5–15.5)
WBC: 4.7 10*3/uL (ref 4.0–10.5)

## 2013-04-08 MED ORDER — MEDROXYPROGESTERONE ACETATE 10 MG PO TABS: 10.0000 mg | ORAL_TABLET | Freq: Every day | ORAL | Status: DC

## 2013-04-08 MED ORDER — IBUPROFEN 600 MG PO TABS: 600.0000 mg | ORAL_TABLET | Freq: Four times a day (QID) | ORAL | Status: DC

## 2013-04-08 NOTE — Discharge Instructions (Signed)
Contraceptive Implant Information A contraceptive implant is a plastic rod that is inserted under your skin. It is usually inserted under the skin of your upper arm. It continually releases small amounts of progestin (synthetic progesterone) into your bloodstream. This prevents an egg from being released from your ovaries. It also thickens your cervical mucus to prevent sperm from entering the cervix, and it thins your uterine lining to prevent a fertilized egg from attaching to your uterus. Contraceptive implants can be effective for up to 3 years. They do not provide protection against sexually transmitted diseases (STDs).  The procedure to insert an implant usually takes about 10 minutes. There may be minor bruising, swelling, and discomfort at the insertion site for a couple days. The implant begins to work within the first day. Other contraceptive protection may be necessary for 7 days. Be sure to discuss with your health care provider if you need a backup method of contraception.  Your health care provider will make sure you are a good candidate for the contraceptive implant. Discuss with your health care provider the possible side effects of the implant. ADVANTAGES  It prevents pregnancy for up to 3 years.  It is easily reversible.  It is convenient.  It can be used when breastfeeding.  It can be used by women who cannot take estrogen. DISADVANTAGES  You may have irregular or unplanned vaginal bleeding.  You may develop side effects, including headache, weight gain, acne, breast tenderness, or mood changes.  You may have tissue or nerve damage after insertion (rare).  It may be difficult and uncomfortable to remove.  Certain medicines may interfere with the effectiveness of the implant. REMOVAL OF IMPLANT The implant should be removed in 3 years or as directed by your health care provider. The implant's effect wears off in a few hours after removal. Your ability to get pregnant  (fertility) may be restored in 1 2 weeks. A new implant can be inserted as soon as the old one is removed if desired. CONTRAINDICATIONS You should not get the implant if you are experiencing any of the following situations:  You are pregnant.  You have a history of breast cancer, osteoporosis, blood clots, heart disease, diabetes, high blood pressure, liver disease, tumors, or stroke.   You have undiagnosed vaginal bleeding.  You have a sensitivity to any part of the implant. Document Released: 02/21/2011 Document Revised: 11/04/2012 Document Reviewed: 08/31/2012 [employer] Patient Information 2014 [employer].  

## 2013-04-08 NOTE — MAU Note (Signed)
Pt with  implanon since July. Reports irregular heavy bleeding since July. Currently bleeding since 03/29/2013, using 4-5 pads a day.

## 2013-04-08 NOTE — MAU Provider Note (Signed)
Chief Complaint: Vaginal Bleeding   First Provider Initiated Contact with Patient 04/09/2013 at 2022.   SUBJECTIVE HPI: [name] is a 23 y.o. G32P2002 female who presents with irreg menstrual bleeding since Nexplanon placed 09/2012 at [facility]. Bleeding ranges from spotting to moderate. Goes through 4-5 pads per day. Has had bleeding as heavy as a normal period since 03/29/2013. Concerned because of the duration of bleeding. Was seen at [facility] for same concerns 02/21/2013. Bleeding stable. Was instructed to F/U at [facility] if bleeding problems continued. States she may want the Nexplanon removed. Did not call [facility] for appt. She is also a pt at [facility]. Has not discussed these concerns with them.   Denies fever, chills, dizziness, heavy bleeding, vaginal discharge, dyspareunia or abd pain.   GC/CT neg last month. Declines repeat.   Past Medical History  Diagnosis Date  . Anemia   . Scoliosis   . Heart murmur   . Late prenatal care   . Gestational hypertension    OB History  Gravida Para Term Preterm AB SAB TAB Ectopic Multiple Living  2 2 2  0 0 0 0 0 0 2    # Outcome Date GA Lbr Len/2nd Weight Sex Delivery Anes PTL Lv  2 TRM 08/06/12 [redacted]w[redacted]d 01:53 / 00:04 3.13 kg (6 lb 14.4 oz) M SVD None  Y  1 TRM 03/20/11 [redacted]w[redacted]d 25:30 / 00:20 2.835 kg (6 lb 4 oz) F SVD Local  Y     Comments: none     Past Surgical History  Procedure Laterality Date  . No past surgeries     History   Social History  . Marital Status: Married    Spouse Name: N/A    Number of Children: N/A  . Years of Education: N/A   Occupational History  . Not on file.   Social History Main Topics  . Smoking status: Never Smoker   . Smokeless tobacco: Never Used  . Alcohol Use: No  . Drug Use: No  . Sexual Activity: No   Other Topics Concern  . Not on file   Social History Narrative   Lives with husband Camellia Popescu and daughter [name] (2013). Is unemployed, completed some college.   No current facility-administered medications on  file prior to encounter.   No current outpatient prescriptions on file prior to encounter.   No Known Allergies  ROS: Pertinent items in HPI  OBJECTIVE Blood pressure 136/99, pulse 71, temperature 98.1 F (36.7 C), temperature source Oral, resp. rate 16, height 5\' 7"  (1.702 m), weight 65.137 kg (143 lb 9.6 oz), last menstrual period 03/29/2013, SpO2 98.00%, not currently breastfeeding.  Patient Vitals for the past 24 hrs:  BP Temp Temp src Pulse Resp SpO2 Height Weight  04/08/13 2021 136/99 mmHg - - 71 - - - -  04/08/13 1922 139/94 mmHg 98.1 F (36.7 C) Oral 63 16 98 % 5\' 7"  (1.702 m) 65.137 kg (143 lb 9.6 oz)    GENERAL: Well-developed, well-nourished female in no acute distress.  HEENT: Normocephalic HEART: normal rate RESP: normal effort ABDOMEN: Soft, non-tender EXTREMITIES: Nontender, no edema NEURO: Alert and oriented SPECULUM EXAM: Declined. Small amount of bleeding while in MAU.   LAB RESULTS Results for orders placed during the hospital encounter of 04/08/13 (from the past 24 hour(s))  POCT PREGNANCY, URINE     Status: None   Collection Time    04/08/13  7:32 PM      Result Value Range   Preg Test, Ur NEGATIVE  NEGATIVE  CBC     Status: Abnormal   Collection Time    04/08/13  7:52 PM      Result Value Range   WBC 4.7  4.0 - 10.5 K/uL   RBC 4.31  3.87 - 5.11 MIL/uL   Hemoglobin 10.8 (*) 12.0 - 15.0 g/dL   HCT 54.033.7 (*) 98.136.0 - 19.146.0 %   MCV 78.2  78.0 - 100.0 fL   MCH 25.1 (*) 26.0 - 34.0 pg   MCHC 32.0  30.0 - 36.0 g/dL   RDW 47.815.0  29.511.5 - 62.115.5 %   Platelets 270  150 - 400 K/uL    IMAGING NA   MAU COURSE  ASSESSMENT 1. Breakthrough bleeding on Nexplanon     PLAN Discharge home in stable condition. Bleeding precautions.  Suggested that pt try Tx for breakthrough bleeding before moving ahead w/ removal of Nexplanon. Pt agrees.  No a candidate for Estrogens due to BP. May need BP meds.  Follow-up Information   [facility]  In 2 months.   Specialty:  Obstetrics and Gynecology   Contact information:   [address] [place] [address] [phone]      [facility] with [facility]. (As needed in emergencies)    Contact information:   [address] [id number] [place] [address] [phone]      Schedule an appointment as soon as possible for a [facility] with [facility] (blood pressure)    Specialty:  Family Medicine   Contact information:   [address] [id number] [place] [address] [phone]       Medication List         ibuprofen 600 MG tablet  Commonly known as:  ADVIL,MOTRIN  Take 1 tablet (600 mg total) by mouth 4 (four) times daily. Take on schedule x 7 days starting now, then take x 7 days when period starts.     medroxyPROGESTERone 10 MG tablet  Commonly known as:  PROVERA  Take 1 tablet (10 mg total) by mouth daily. Take x 21 days. Off x 7 days. Repeat x 2-3 months.       [place] Fal[place] Liz[place], CNM 04/08/2013  8:40 PM

## 2013-04-16 ENCOUNTER — Encounter: Payer: Self-pay | Admitting: Nurse Practitioner

## 2013-04-16 NOTE — MAU Provider Note (Signed)
Attestation of Attending Supervision of Advanced Practitioner (CNM/NP): Evaluation and management procedures were performed by the Advanced Practitioner under my supervision and collaboration. I have reviewed the Advanced Practitioner's note and chart, and I agree with the management and plan.  Assata Juncaj H. 5:03 PM

## 2013-05-07 ENCOUNTER — Ambulatory Visit: Payer: BC Managed Care – PPO | Admitting: Nurse Practitioner

## 2013-09-10 ENCOUNTER — Telehealth: Payer: Self-pay | Admitting: Family Medicine

## 2013-09-10 MED ORDER — CETIRIZINE HCL 10 MG PO TABS: 10.0000 mg | ORAL_TABLET | Freq: Every day | ORAL | Status: DC

## 2013-09-10 MED ORDER — TRIAMCINOLONE ACETONIDE 0.1 % EX CREA: 1.0000 "application " | TOPICAL_CREAM | Freq: Two times a day (BID) | CUTANEOUS | Status: DC

## 2013-09-10 NOTE — Telephone Encounter (Signed)
I spoke w/ pt and prescriptions have been sent to pharmacy. Thanks

## 2013-09-10 NOTE — Telephone Encounter (Signed)
I saw pt on Wednesday 24th when [facility] She reported skin lesions that were consistent with urticaria. We discussed to start allergy medication and topical steroids and she agreed with plan. I will add those to her meds and pt was instructed to follow up if lesions did not improved or worsen. We also discussed about other signs or symptoms that should prompt emergent evaluation and she was agreeable w/ plan.

## 2013-09-10 NOTE — Telephone Encounter (Signed)
Needs refill on zyrtec and cream Please advise

## 2013-11-29 ENCOUNTER — Ambulatory Visit: Payer: BC Managed Care – PPO | Admitting: Nurse Practitioner

## 2013-11-29 ENCOUNTER — Telehealth: Payer: Self-pay | Admitting: *Deleted

## 2013-11-29 ENCOUNTER — Encounter: Payer: Self-pay | Admitting: *Deleted

## 2013-11-29 NOTE — Telephone Encounter (Signed)
Attempted to contact patient to inform of missed appointment, both numbers are invalid. Will send letter.  Letter sent/

## 2014-01-17 ENCOUNTER — Encounter (HOSPITAL_COMMUNITY): Payer: Self-pay | Admitting: *Deleted

## 2014-01-21 ENCOUNTER — Encounter: Payer: Self-pay | Admitting: Obstetrics & Gynecology

## 2014-01-21 ENCOUNTER — Ambulatory Visit: Payer: Medicaid Other | Admitting: Obstetrics & Gynecology

## 2014-01-21 VITALS — BP 138/88 | HR 83 | Ht 67.0 in | Wt 138.2 lb

## 2014-01-21 DIAGNOSIS — N921 Excessive and frequent menstruation with irregular cycle: Secondary | ICD-10-CM | POA: Insufficient documentation

## 2014-01-21 DIAGNOSIS — Z975 Presence of (intrauterine) contraceptive device: Principal | ICD-10-CM

## 2014-01-21 DIAGNOSIS — N939 Abnormal uterine and vaginal bleeding, unspecified: Secondary | ICD-10-CM

## 2014-01-21 MED ORDER — NORETHINDRONE ACET-ETHINYL EST 1-20 MG-MCG PO TABS: 1.0000 | ORAL_TABLET | Freq: Every day | ORAL | Status: DC

## 2014-01-21 NOTE — Patient Instructions (Signed)
Levonorgestrel intrauterine device (IUD) What is this medicine? LEVONORGESTREL IUD (LEE voe nor jes trel) is a contraceptive (birth control) device. The device is placed inside the uterus by a healthcare professional. It is used to prevent pregnancy and can also be used to treat heavy bleeding that occurs during your period. Depending on the device, it can be used for 3 to 5 years. This medicine may be used for other purposes; ask your health care provider or pharmacist if you have questions. COMMON BRAND NAME(S): LILETTA, Mirena, Skyla What should I tell my health care provider before I take this medicine? They need to know if you have any of these conditions: -abnormal Pap smear -cancer of the breast, uterus, or cervix -diabetes -endometritis -genital or pelvic infection now or in the past -have more than one sexual partner or your partner has more than one partner -heart disease -history of an ectopic or tubal pregnancy -immune system problems -IUD in place -liver disease or tumor -problems with blood clots or take blood-thinners -use intravenous drugs -uterus of unusual shape -vaginal bleeding that has not been explained -an unusual or allergic reaction to levonorgestrel, other hormones, silicone, or polyethylene, medicines, foods, dyes, or preservatives -pregnant or trying to get pregnant -breast-feeding How should I use this medicine? This device is placed inside the uterus by a health care professional. Talk to your pediatrician regarding the use of this medicine in children. Special care may be needed. Overdosage: If you think you have taken too much of this medicine contact a poison control center or emergency room at once. NOTE: This medicine is only for you. Do not share this medicine with others. What if I miss a dose? This does not apply. What may interact with this medicine? Do not take this medicine with any of the following  medications: -amprenavir -bosentan -fosamprenavir This medicine may also interact with the following medications: -aprepitant -barbiturate medicines for inducing sleep or treating seizures -bexarotene -griseofulvin -medicines to treat seizures like carbamazepine, ethotoin, felbamate, oxcarbazepine, phenytoin, topiramate -modafinil -pioglitazone -rifabutin -rifampin -rifapentine -some medicines to treat HIV infection like atazanavir, indinavir, lopinavir, nelfinavir, tipranavir, ritonavir -St. John's wort -warfarin This list may not describe all possible interactions. Give your health care provider a list of all the medicines, herbs, non-prescription drugs, or dietary supplements you use. Also tell them if you smoke, drink alcohol, or use illegal drugs. Some items may interact with your medicine. What should I watch for while using this medicine? Visit your doctor or health care professional for regular check ups. See your doctor if you or your partner has sexual contact with others, becomes HIV positive, or gets a sexual transmitted disease. This product does not protect you against HIV infection (AIDS) or other sexually transmitted diseases. You can check the placement of the IUD yourself by reaching up to the top of your vagina with clean fingers to feel the threads. Do not pull on the threads. It is a good habit to check placement after each menstrual period. Call your doctor right away if you feel more of the IUD than just the threads or if you cannot feel the threads at all. The IUD may come out by itself. You may become pregnant if the device comes out. If you notice that the IUD has come out use a backup birth control method like condoms and call your health care provider. Using tampons will not change the position of the IUD and are okay to use during your period. What side effects may   I notice from receiving this medicine? Side effects that you should report to your doctor or  health care professional as soon as possible: -allergic reactions like skin rash, itching or hives, swelling of the face, lips, or tongue -fever, flu-like symptoms -genital sores -high blood pressure -no menstrual period for 6 weeks during use -pain, swelling, warmth in the leg -pelvic pain or tenderness -severe or sudden headache -signs of pregnancy -stomach cramping -sudden shortness of breath -trouble with balance, talking, or walking -unusual vaginal bleeding, discharge -yellowing of the eyes or skin Side effects that usually do not require medical attention (report to your doctor or health care professional if they continue or are bothersome): -acne -breast pain -change in sex drive or performance -changes in weight -cramping, dizziness, or faintness while the device is being inserted -headache -irregular menstrual bleeding within first 3 to 6 months of use -nausea This list may not describe all possible side effects. Call your doctor for medical advice about side effects. You may report side effects to FDA at 1-800-FDA-1088. Where should I keep my medicine? This does not apply. NOTE: This sheet is a summary. It may not cover all possible information. If you have questions about this medicine, talk to your doctor, pharmacist, or health care provider.  2015, Elsevier/Gold Standard. (2011-04-04 13:54:04)  

## 2014-01-21 NOTE — Progress Notes (Signed)
Subjective:     Patient ID: [name], female   DOB: 05/20/1990, 23 y.o.   MRN: [id number]  HPI  DUB on Nexplanon: - Reported that she had Nexplanon placed 09/2012 for contraception, previously was on Depo-provera IM and also previous hx OCPs. Stated that within first month after Nexplanon insertion she had significantly increased heavy and irregular bleeding, with clots and some abdominal cramping, breakthrough bleeding persisted at times up to 1 month at a time, intermittently since insertion without improvement. Followed up at ED for vaginal bleeding, and MAU in 03/2013, rx'd course of Provera (however patient did not complete this course). - Denies any other side-effects on Nexplanon - Currently sexually active - Interested in Nexplanon removal due to bleeding, plans for Depo-provera if Nexplanon removed  PMH: - No history of HTN, previously documented concern for elevated BP  OB: G2P2002  I have reviewed and updated the following as appropriate: allergies and current medications  Social Hx: - Never smoker  Review of Systems  See above HPI    Objective:   Physical Exam  BP 138/88 mmHg  Pulse 83  Ht 5\' 7"  (1.702 m)  Wt 138 lb 3.2 oz (62.687 kg)  BMI 21.64 kg/m2  Breastfeeding? No  Gen - well-appearing, NAD Ext - Left Upper Ext: Nexplanon palpable, non-tender Skin - warm, dry, no rashes     Assessment:     23 yr G2P2 F presents with breakthrough DUB on Nexplanon, requesting removal, however without adequate medical treatment of DUB, without trial on OCPs. Patient is candidate for OCPs, no hx HTN, and per chart review no documented hx of OCPs causing inc BP. Suspect low dose estrogen to compliment Nexplanon to decrease bleeding.    Plan:     1. Loestrin - 20 mcg x 3 month trial 2. Keep Nexplanon, do not remove at this time 3. RTC 3 months for re-evaluation bleeding, if improved will continue OCPs, otherwise consider Mirena, handout given for Mirena  [name] PilarAlexander  Keanu Frickey, DO [facility], PGY-2  Attestation of Attending Supervision of Resident: Evaluation and management procedures were performed by the [facility] Resident under my supervision.  I have seen and examined the patient, reviewed the resident's note and chart, and I agree with the management and plan.  [name], M.D. 01/21/2014 9:42 AM

## 2014-01-21 NOTE — Progress Notes (Signed)
Patient ID: [name], female   DOB: 04/14/1990, 23 y.o.   MRN: [id number] Attestation of Attending Supervision of Resident: Evaluation and management procedures were performed by the [facility] under my supervision.  I have seen and examined the patient, reviewed the resident's note and chart, and I agree with the management and plan.  Anibal Hendersonarolyn L Harraway-Smith, M.D. 01/21/2014 9:42 AM

## 2014-01-26 ENCOUNTER — Telehealth: Payer: Self-pay

## 2014-01-27 NOTE — Telephone Encounter (Signed)
Opened in error

## 2014-02-05 ENCOUNTER — Encounter (HOSPITAL_COMMUNITY): Payer: Self-pay

## 2014-02-05 ENCOUNTER — Inpatient Hospital Stay (HOSPITAL_COMMUNITY)
Admission: AD | Admit: 2014-02-05 | Discharge: 2014-02-05 | Disposition: A | Discharge status: Home / Self Care | Payer: Medicaid Other | Source: Ambulatory Visit | Attending: Obstetrics and Gynecology | Admitting: Obstetrics and Gynecology

## 2014-02-05 DIAGNOSIS — N898 Other specified noninflammatory disorders of vagina: Secondary | ICD-10-CM | POA: Insufficient documentation

## 2014-02-05 DIAGNOSIS — T8383XD Hemorrhage of genitourinary prosthetic devices, implants and grafts, subsequent encounter: Secondary | ICD-10-CM | POA: Diagnosis not present

## 2014-02-05 DIAGNOSIS — A499 Bacterial infection, unspecified: Secondary | ICD-10-CM

## 2014-02-05 DIAGNOSIS — B9689 Other specified bacterial agents as the cause of diseases classified elsewhere: Secondary | ICD-10-CM

## 2014-02-05 DIAGNOSIS — N760 Acute vaginitis: Secondary | ICD-10-CM | POA: Diagnosis not present

## 2014-02-05 DIAGNOSIS — N921 Excessive and frequent menstruation with irregular cycle: Secondary | ICD-10-CM

## 2014-02-05 DIAGNOSIS — Z975 Presence of (intrauterine) contraceptive device: Secondary | ICD-10-CM

## 2014-02-05 LAB — WET PREP, GENITAL
Trich, Wet Prep: NONE SEEN
Yeast Wet Prep HPF POC: NONE SEEN

## 2014-02-05 LAB — CBC
HCT: 29.6 % — ABNORMAL LOW (ref 36.0–46.0)
Hemoglobin: 9.3 g/dL — ABNORMAL LOW (ref 12.0–15.0)
MCH: 24.2 pg — ABNORMAL LOW (ref 26.0–34.0)
MCHC: 31.4 g/dL (ref 30.0–36.0)
MCV: 77.1 fL — ABNORMAL LOW (ref 78.0–100.0)
Platelets: 292 10*3/uL (ref 150–400)
RBC: 3.84 MIL/uL — ABNORMAL LOW (ref 3.87–5.11)
RDW: 15.8 % — ABNORMAL HIGH (ref 11.5–15.5)
WBC: 6.4 10*3/uL (ref 4.0–10.5)

## 2014-02-05 LAB — POCT PREGNANCY, URINE: Preg Test, Ur: NEGATIVE

## 2014-02-05 LAB — HIV ANTIBODY (ROUTINE TESTING W REFLEX): HIV 1&2 Ab, 4th Generation: NONREACTIVE

## 2014-02-05 MED ORDER — IBUPROFEN 800 MG PO TABS: 800.0000 mg | ORAL_TABLET | Freq: Three times a day (TID) | ORAL | Status: DC

## 2014-02-05 MED ORDER — AZITHROMYCIN 250 MG PO TABS
1000.0000 mg | ORAL_TABLET | Freq: Once | ORAL | Status: AC
  Administered 2014-02-05: 1000 mg via ORAL
  Filled 2014-02-05: qty 4

## 2014-02-05 MED ORDER — CEFTRIAXONE SODIUM 250 MG IJ SOLR
250.0000 mg | Freq: Once | INTRAMUSCULAR | Status: AC
  Administered 2014-02-05: 250 mg via INTRAMUSCULAR
  Filled 2014-02-05: qty 250

## 2014-02-05 MED ORDER — FERROUS SULFATE 325 (65 FE) MG PO TABS: 325.0000 mg | ORAL_TABLET | Freq: Three times a day (TID) | ORAL | Status: DC

## 2014-02-05 MED ORDER — NORGESTIMATE-ETH ESTRADIOL 0.25-35 MG-MCG PO TABS: 1.0000 | ORAL_TABLET | Freq: Every day | ORAL | Status: DC

## 2014-02-05 NOTE — Discharge Instructions (Signed)
Abnormal Uterine Bleeding °Abnormal uterine bleeding can affect women at various stages in life, including teenagers, women in their reproductive years, pregnant women, and women who have reached menopause. Several kinds of uterine bleeding are considered abnormal, including: °· Bleeding or spotting between periods.   °· Bleeding after sexual intercourse.   °· Bleeding that is heavier or more than normal.   °· Periods that last longer than usual. °· Bleeding after menopause.   °Many cases of abnormal uterine bleeding are minor and simple to treat, while others are more serious. Any type of abnormal bleeding should be evaluated by your health care provider. Treatment will depend on the cause of the bleeding. °HOME CARE INSTRUCTIONS °Monitor your condition for any changes. The following actions may help to alleviate any discomfort you are experiencing: °· Avoid the use of tampons and douches as directed by your health care provider. °· Change your pads frequently. °You should get regular pelvic exams and Pap tests. Keep all follow-up appointments for diagnostic tests as directed by your health care provider.  °SEEK MEDICAL CARE IF:  °· Your bleeding lasts more than 1 week.   °· You feel dizzy at times.   °SEEK IMMEDIATE MEDICAL CARE IF:  °· You pass out.   °· You are changing pads every 15 to 30 minutes.   °· You have abdominal pain. °· You have a fever.   °· You become sweaty or weak.   °· You are passing large blood clots from the vagina.   °· You start to feel nauseous and vomit. °MAKE SURE YOU:  °· Understand these instructions. °· Will watch your condition. °· Will get help right away if you are not doing well or get worse. °Document Released: 03/04/2005 Document Revised: 03/09/2013 Document Reviewed: 10/01/2012 °ExitCare® Patient Information ©2015 [employer]. This information is not intended to replace advice given to you by your health care provider. Make sure you discuss any questions you have with your  health care provider. ° °Bacterial Vaginosis °Bacterial vaginosis is an infection of the vagina. It happens when too many of certain germs (bacteria) grow in the vagina. °HOME CARE °· Take your medicine as told by your doctor. °· Finish your medicine even if you start to feel better. °· Do not have sex until you finish your medicine and are better. °· Tell your sex partner that you have an infection. They should see their doctor for treatment. °· Practice safe sex. Use condoms. Have only one sex partner. °GET HELP IF: °· You are not getting better after 3 days of treatment. °· You have more grey fluid (discharge) coming from your vagina than before. °· You have more pain than before. °· You have a fever. °MAKE SURE YOU:  °· Understand these instructions. °· Will watch your condition. °· Will get help right away if you are not doing well or get worse. °Document Released: 12/12/2007 Document Revised: 12/23/2012 Document Reviewed: 10/14/2012 °ExitCare® Patient Information ©2015 [employer]. This information is not intended to replace advice given to you by your health care provider. Make sure you discuss any questions you have with your health care provider. ° °

## 2014-02-05 NOTE — MAU Provider Note (Signed)
History     CSN: [id number]  Arrival date and time: 02/05/14 1212   First Provider Initiated Contact with Patient 02/05/14 1319      Chief Complaint  Patient presents with  . Vaginal Bleeding   HPI 23 y.o. [id number] w/ ongoing abnormal vaginal bleeding since insert of Nexplanon more than 1 year ago. [facility] this month and given rx for Loestrin 1/20, which has not helped w/ bleeding.   Past Medical History  Diagnosis Date  . Anemia   . Scoliosis   . Heart murmur   . Late prenatal care   . Gestational hypertension     Past Surgical History  Procedure Laterality Date  . No past surgeries      Family History  Problem Relation Age of Onset  . Anemia Mother   . Migraines Mother   . Mitral valve prolapse Sister   . Heart murmur Brother   . Heart attack Maternal Grandmother   . Mitral valve prolapse Maternal Grandmother   . Heart disease Maternal Grandmother   . Diabetes Maternal Grandmother   . Hyperlipidemia Maternal Grandmother   . Hypertension Maternal Grandmother     History  Substance Use Topics  . Smoking status: Never Smoker   . Smokeless tobacco: Never Used  . Alcohol Use: No    Allergies: No Known Allergies  Prescriptions prior to admission  Medication Sig Dispense Refill Last Dose  . cetirizine (ZYRTEC) 10 MG tablet Take 1 tablet (10 mg total) by mouth daily. 30 tablet 11 02/04/2014 at Unknown time  . norethindrone-ethinyl estradiol (LOESTRIN 1/20, 21,) 1-20 MG-MCG tablet Take 1 tablet by mouth daily. 1 Package 5 02/04/2014 at Unknown time  . ibuprofen (ADVIL,MOTRIN) 600 MG tablet Take 1 tablet (600 mg total) by mouth 4 (four) times daily. Take on schedule x 7 days starting now, then take x 7 days when period starts. (Patient not taking: Reported on 02/05/2014) 30 tablet 6 prn  . medroxyPROGESTERone (PROVERA) 10 MG tablet Take 1 tablet (10 mg total) by mouth daily. Take x 21 days. Off x 7 days. Repeat. (Patient not taking: Reported on  02/05/2014) 21 tablet 2   . triamcinolone cream (KENALOG) 0.1 % Apply 1 application topically 2 (two) times daily. (Patient not taking: Reported on 02/05/2014) 30 g 0     Review of Systems  Constitutional: Negative.   Respiratory: Negative.   Cardiovascular: Negative.   Gastrointestinal: Negative for nausea, vomiting, abdominal pain, diarrhea and constipation.  Genitourinary: Negative for dysuria, urgency, frequency, hematuria and flank pain.       + bleeding and discharge  Musculoskeletal: Negative.   Neurological: Negative.   Psychiatric/Behavioral: Negative.    Physical Exam   Blood pressure 132/88, pulse 74, temperature 98.4 F (36.9 C), temperature source Oral, resp. rate 16, height 5\' 7"  (1.702 m), weight 135 lb 4 oz (61.349 kg), not currently breastfeeding.  Physical Exam  Nursing note and vitals reviewed. Constitutional: She is oriented to person, place, and time. She appears well-developed and well-nourished. No distress.  HENT:  Head: Normocephalic and atraumatic.  Cardiovascular: Normal rate.   Respiratory: Effort normal. No respiratory distress.  GI: Soft. She exhibits no distension and no mass. There is no tenderness. There is no rebound and no guarding.  Genitourinary: There is no rash or lesion on the right labia. There is no rash or lesion on the left labia. Uterus is not deviated, not enlarged, not fixed and not tender. Cervix exhibits no motion tenderness,  no discharge and no friability. Right adnexum displays no mass, no tenderness and no fullness. Left adnexum displays no mass, no tenderness and no fullness. There is bleeding (small-moderate) in the vagina. No erythema or tenderness in the vagina. Vaginal discharge (copious, malodorous, mucoid) found.  Neurological: She is alert and oriented to person, place, and time.  Skin: Skin is warm and dry.  Psychiatric: She has a normal mood and affect.    MAU Course  Procedures Results for orders placed or performed  during the hospital encounter of 02/05/14 (from the past 24 hour(s))  Pregnancy, urine POC     Status: None   Collection Time: 02/05/14 12:37 PM  Result Value Ref Range   Preg Test, Ur NEGATIVE NEGATIVE  CBC     Status: Abnormal   Collection Time: 02/05/14  1:05 PM  Result Value Ref Range   WBC 6.4 4.0 - 10.5 K/uL   RBC 3.84 (L) 3.87 - 5.11 MIL/uL   Hemoglobin 9.3 (L) 12.0 - 15.0 g/dL   HCT 40.9 (L) 81.1 - 91.4 %   MCV 77.1 (L) 78.0 - 100.0 fL   MCH 24.2 (L) 26.0 - 34.0 pg   MCHC 31.4 30.0 - 36.0 g/dL   RDW 78.2 (H) 95.6 - 21.3 %   Platelets 292 150 - 400 K/uL  Wet prep, genital     Status: Abnormal   Collection Time: 02/05/14  1:15 PM  Result Value Ref Range   Yeast Wet Prep HPF POC NONE SEEN NONE SEEN   Trich, Wet Prep NONE SEEN NONE SEEN   Clue Cells Wet Prep HPF POC FEW (A) NONE SEEN   WBC, Wet Prep HPF POC FEW (A) NONE SEEN   Azithromycin 1000mg  PO and Rocephin 250 mg IM in MAU   Assessment and Plan   1. Breakthrough bleeding on Nexplanon   2. Vaginal leukorrhea   3. BV (bacterial vaginosis)    Discussed breakthrough bleeding w/ Nexplanon, changed COCs to Sprintec for higher estrogen dose and added Motrin 800 mg TID to see if bleeding improves. Start iron supplement. Also offered empiric tx for GC/CT today d/t copious malodorous mucous d/c on exam and WBC on wet prep. F/u in office, consider nexplanon removal.    Medication List    STOP taking these medications        medroxyPROGESTERone 10 MG tablet  Commonly known as:  PROVERA     norethindrone-ethinyl estradiol 1-20 MG-MCG tablet  Commonly known as:  LOESTRIN 1/20 (21)     triamcinolone cream 0.1 %  Commonly known as:  KENALOG      TAKE these medications        cetirizine 10 MG tablet  Commonly known as:  ZYRTEC  Take 1 tablet (10 mg total) by mouth daily.     ferrous sulfate 325 (65 FE) MG tablet  Take 1 tablet (325 mg total) by mouth 3 (three) times daily with meals.     ibuprofen 800 MG tablet   Commonly known as:  ADVIL,MOTRIN  Take 1 tablet (800 mg total) by mouth 3 (three) times daily. Take on schedule x 7 days starting now, then take x 7 days when period starts.     norgestimate-ethinyl estradiol 0.25-35 MG-MCG tablet  Commonly known as:  ORTHO-CYCLEN,SPRINTEC,PREVIFEM  Take 1 tablet by mouth daily.            Follow-up Information    [facility]   Contact information:   [address]  [address] [place] [id number]         Giomar Gusler 02/05/2014, 1:44 PM

## 2014-02-05 NOTE — MAU Note (Signed)
Pt states here for bleeding and passed large clot this am. Has nexplanon and was also given oral birth control to help stop heavy bleeding which has not helped thus far.

## 2014-02-08 ENCOUNTER — Encounter (HOSPITAL_COMMUNITY): Payer: Self-pay | Admitting: *Deleted

## 2014-02-08 LAB — GC/CHLAMYDIA PROBE AMP
CT Probe RNA: POSITIVE — AB
GC Probe RNA: NEGATIVE

## 2014-02-25 ENCOUNTER — Other Ambulatory Visit: Payer: Self-pay | Admitting: Advanced Practice Midwife

## 2014-02-25 ENCOUNTER — Telehealth: Payer: Self-pay

## 2014-02-25 MED ORDER — NORGESTIMATE-ETH ESTRADIOL 0.25-35 MG-MCG PO TABS: 1.0000 | ORAL_TABLET | Freq: Every day | ORAL | Status: DC

## 2014-02-25 NOTE — Telephone Encounter (Signed)
Pt called and requested a refill on her BCP's.  Called pt and left message that her request has been sent to her [facility] pharmacy off [address] And to please call our office to schedule an annual exam.  If she has any questions to please give our office a call.

## 2014-04-07 ENCOUNTER — Ambulatory Visit: Payer: Medicaid Other | Admitting: Family Medicine

## 2014-04-15 ENCOUNTER — Other Ambulatory Visit: Payer: Self-pay | Admitting: Obstetrics and Gynecology

## 2014-05-11 ENCOUNTER — Encounter: Payer: Self-pay | Admitting: Certified Nurse Midwife

## 2014-05-11 ENCOUNTER — Other Ambulatory Visit (HOSPITAL_COMMUNITY)
Admission: RE | Admit: 2014-05-11 | Discharge: 2014-05-11 | Disposition: A | Discharge status: Home / Self Care | Payer: Medicaid Other | Source: Ambulatory Visit | Attending: Certified Nurse Midwife | Admitting: Certified Nurse Midwife

## 2014-05-11 ENCOUNTER — Ambulatory Visit (INDEPENDENT_AMBULATORY_CARE_PROVIDER_SITE_OTHER): Payer: Medicaid Other | Admitting: Certified Nurse Midwife

## 2014-05-11 VITALS — Ht 67.0 in | Wt 138.4 lb

## 2014-05-11 DIAGNOSIS — Z01419 Encounter for gynecological examination (general) (routine) without abnormal findings: Secondary | ICD-10-CM | POA: Diagnosis not present

## 2014-05-11 DIAGNOSIS — R87612 Low grade squamous intraepithelial lesion on cytologic smear of cervix (LGSIL): Secondary | ICD-10-CM | POA: Diagnosis not present

## 2014-05-11 DIAGNOSIS — Z01411 Encounter for gynecological examination (general) (routine) with abnormal findings: Secondary | ICD-10-CM | POA: Diagnosis present

## 2014-05-11 DIAGNOSIS — Z113 Encounter for screening for infections with a predominantly sexual mode of transmission: Secondary | ICD-10-CM | POA: Diagnosis present

## 2014-05-11 NOTE — Progress Notes (Signed)
Patient ID: [name], female   DOB: 05-27-1990, 24 y.o.   MRN: [id number]  Chief Complaint  Patient presents with  . Gynecologic Exam    HPI [name] is a 24 y.o. female G2P2002 that presents for her annual exam. She is currently sexually active, Using nexplanon and OCP's for birth control.  HPI  Past Medical History  Diagnosis Date  . Anemia   . Scoliosis   . Heart murmur   . Late prenatal care   . Gestational hypertension     Past Surgical History  Procedure Laterality Date  . No past surgeries      Family History  Problem Relation Age of Onset  . Anemia Mother   . Migraines Mother   . Mitral valve prolapse Sister   . Heart murmur Brother   . Heart attack Maternal Grandmother   . Mitral valve prolapse Maternal Grandmother   . Heart disease Maternal Grandmother   . Diabetes Maternal Grandmother   . Hyperlipidemia Maternal Grandmother   . Hypertension Maternal Grandmother     Social History History  Substance Use Topics  . Smoking status: Never Smoker   . Smokeless tobacco: Never Used  . Alcohol Use: No    No Known Allergies  Current Outpatient Prescriptions  Medication Sig Dispense Refill  . MONONESSA 0.25-35 MG-MCG tablet TAKE 1 TABLET BY MOUTH DAILY 84 tablet 0  . cetirizine (ZYRTEC) 10 MG tablet Take 1 tablet (10 mg total) by mouth daily. (Patient not taking: Reported on 05/11/2014) 30 tablet 11  . ferrous sulfate 325 (65 FE) MG tablet Take 1 tablet (325 mg total) by mouth 3 (three) times daily with meals. (Patient not taking: Reported on 05/11/2014) 90 tablet 11  . ibuprofen (ADVIL,MOTRIN) 800 MG tablet Take 1 tablet (800 mg total) by mouth 3 (three) times daily. Take on schedule x 7 days starting now, then take x 7 days when period starts. (Patient not taking: Reported on 05/11/2014) 30 tablet 0   No current facility-administered medications for this visit.    Review of Systems Review of Systems  Constitutional: Negative.   HENT: Negative.    Eyes: Negative.   Respiratory: Negative for shortness of breath.   Gastrointestinal: Negative for abdominal distention.  Endocrine: Negative.   Genitourinary: Negative for dysuria, frequency and dyspareunia.  Allergic/Immunologic: Negative.   Neurological: Negative.   Hematological: Negative.   Psychiatric/Behavioral: Negative.     Height  (1.702 m), weight 138 lb 6.4 oz (62.778 kg), last menstrual period 05/02/2014, not currently breastfeeding.  Physical Exam Physical Exam  Constitutional: She is oriented to person, place, and time. She appears well-developed and well-nourished.  HENT:  Head: Normocephalic and atraumatic.  Neck: Normal range of motion. No thyromegaly present.  Cardiovascular: Normal rate, regular rhythm and normal heart sounds.   Pulmonary/Chest: Effort normal and breath sounds normal. No respiratory distress. Right breast exhibits no inverted nipple, no mass, no nipple discharge, no skin change and no tenderness. Left breast exhibits no inverted nipple, no mass, no nipple discharge, no skin change and no tenderness. Breasts are symmetrical.  Abdominal: Soft. She exhibits no distension and no mass. There is no tenderness. There is no rebound and no guarding.  Genitourinary: No breast swelling, tenderness, discharge or bleeding. There is no rash, tenderness, lesion or injury on the right labia. There is no rash, tenderness, lesion or injury on the left labia. No erythema, tenderness or bleeding in the vagina. No foreign body around the vagina. No signs of  injury around the vagina. No vaginal discharge found.  Musculoskeletal: Normal range of motion.  Lymphadenopathy:       Right: No inguinal adenopathy present.       Left: No inguinal adenopathy present.  Neurological: She is alert and oriented to person, place, and time.  Skin: Skin is warm and dry.  Nursing note and vitals reviewed.   Data Reviewed   Assessment   Annual Exam- 24 years old       Plan     Pap  GC/C Breast Exam Return to clinic in 1 year for annual exam       [name] 05/11/2014, 10:07 AM

## 2014-05-11 NOTE — Patient Instructions (Signed)
Pap Test A Pap test is a procedure done in a clinic office to evaluate cells that are on the surface of the cervix. The cervix is the lower portion of the uterus and upper portion of the vagina. For some women, the cervical region has the potential to form cancer. With consistent evaluations by your caregiver, this type of cancer can be prevented.  If a Pap test is abnormal, it is most often a result of a previous exposure to human papillomavirus (HPV). HPV is a virus that can infect the cells of the cervix and cause dysplasia. Dysplasia is where the cells no longer look normal. If a woman has been diagnosed with high-grade or severe dysplasia, they are at higher risk of developing cervical cancer. People diagnosed with low-grade dysplasia should still be seen by their caregiver because there is a small chance that low-grade dysplasia could develop into cancer.  LET YOUR CAREGIVER KNOW ABOUT:  Recent sexually transmitted infection (STI) you have had.  Any new sex partners you have had.  History of previous abnormal Pap tests results.  History of previous cervical procedures you have had (colposcopy, biopsy, loop electrosurgical excision procedure [LEEP]).  Concerns you have had regarding unusual vaginal discharge.  History of pelvic pain.  Your use of birth control. BEFORE THE PROCEDURE  Ask your caregiver when to schedule your Pap test. It is best not to be on your period if your caregiver uses a wooden spatula to collect cells or applies cells to a glass slide. Newer techniques are not so sensitive to the timing of a menstrual cycle.  Do not douche or have sexual intercourse for 24 hours before the test.   Do not use vaginal creams or tampons for 24 hours before the test.   Empty your bladder just before the test to lessen any discomfort.  PROCEDURE You will lie on an exam table with your feet in stirrups. A warm metal or plastic instrument (speculum) is placed in your vagina. This  instrument allows your caregiver to see the inside of your vagina and look at your cervix. A small, plastic brush or wooden spatula is then used to collect cervical cells. These cells are placed in a lab specimen container. The cells are looked at under a microscope. A specialist will determine if the cells are normal.  AFTER THE PROCEDURE Make sure to get your test results.If your results come back abnormal, you may need further testing.  Document Released: 05/25/2002 Document Revised: 05/27/2011 Document Reviewed: 02/28/2011 [facility] Patient Information 2015 [place], [place]. This information is not intended to replace advice given to you by your health care provider. Make sure you discuss any questions you have with your health care provider. Breast Self-Awareness Practicing breast self-awareness may pick up problems early, prevent significant medical complications, and possibly save your life. By practicing breast self-awareness, you can become familiar with how your breasts look and feel and if your breasts are changing. This allows you to notice changes early. It can also offer you some reassurance that your breast health is good. One way to learn what is normal for your breasts and whether your breasts are changing is to do a breast self-exam. If you find a lump or something that was not present in the past, it is best to contact your caregiver right away. Other findings that should be evaluated by your caregiver include nipple discharge, especially if it is bloody; skin changes or reddening; areas where the skin seems to be pulled in (  retracted); or new lumps and bumps. Breast pain is seldom associated with cancer (malignancy), but should also be evaluated by a caregiver. HOW TO PERFORM A BREAST SELF-EXAM The best time to examine your breasts is 5-7 days after your menstrual period is over. During menstruation, the breasts are lumpier, and it may be more difficult to pick up changes. If you do not  menstruate, have reached menopause, or had your uterus removed (hysterectomy), you should examine your breasts at regular intervals, such as monthly. If you are breastfeeding, examine your breasts after a feeding or after using a breast pump. Breast implants do not decrease the risk for lumps or tumors, so continue to perform breast self-exams as recommended. Talk to your caregiver about how to determine the difference between the implant and breast tissue. Also, talk about the amount of pressure you should use during the exam. Over time, you will become more familiar with the variations of your breasts and more comfortable with the exam. A breast self-exam requires you to remove all your clothes above the waist.  Look at your breasts and nipples. Stand in front of a mirror in a room with good lighting. With your hands on your hips, push your hands firmly downward. Look for a difference in shape, contour, and size from one breast to the other (asymmetry). Asymmetry includes puckers, dips, or bumps. Also, look for skin changes, such as reddened or scaly areas on the breasts. Look for nipple changes, such as discharge, dimpling, repositioning, or redness.  Carefully feel your breasts. This is best done either in the shower or tub while using soapy water or when flat on your back. Place the arm (on the side of the breast you are examining) above your head. Use the pads (not the fingertips) of your three middle fingers on your opposite hand to feel your breasts. Start in the underarm area and use  inch (2 cm) overlapping circles to feel your breast. Use 3 different levels of pressure (light, medium, and firm pressure) at each circle before moving to the next circle. The light pressure is needed to feel the tissue closest to the skin. The medium pressure will help to feel breast tissue a little deeper, while the firm pressure is needed to feel the tissue close to the ribs. Continue the overlapping circles, moving  downward over the breast until you feel your ribs below your breast. Then, move one finger-width towards the center of the body. Continue to use the  inch (2 cm) overlapping circles to feel your breast as you move slowly up toward the collar bone (clavicle) near the base of the neck. Continue the up and down exam using all 3 pressures until you reach the middle of the chest. Do this with each breast, carefully feeling for lumps or changes.   Keep a written record with breast changes or normal findings for each breast. By writing this information down, you do not need to depend only on memory for size, tenderness, or location. Write down where you are in your menstrual cycle, if you are still menstruating. Breast tissue can have some lumps or thick tissue. However, see your caregiver if you find anything that concerns you.  SEEK MEDICAL CARE IF:  You see a change in shape, contour, or size of your breasts or nipples.   You see skin changes, such as reddened or scaly areas on the breasts or nipples.   You have an unusual discharge from your nipples.   You feel  a new lump or unusually thick areas.  Document Released: 03/04/2005 Document Revised: 02/19/2012 Document Reviewed: 06/19/2011 [facility] Patient Information 2015 [place]. This information is not intended to replace advice given to you by your health care provider. Make sure you discuss any questions you have with your health care provider.

## 2014-05-13 ENCOUNTER — Ambulatory Visit: Payer: Medicaid Other | Admitting: Obstetrics & Gynecology

## 2014-05-13 LAB — CYTOLOGY - PAP

## 2014-05-18 ENCOUNTER — Telehealth: Payer: Self-pay

## 2014-05-18 NOTE — Telephone Encounter (Addendum)
-----   Message from Vivien Rotaheryl A Clinton sent at 05/18/2014  1:43 PM EST ----- Appointment is 03/24 @1 :15    ----- Message -----    From: Louanna Rawaylor M Juell Radney, RN    Sent: 05/18/2014  11:53 AM      To: [facility] Admin Pool  Please schedule patient for a colpo and call with appointment. We are attempting to reach patient with results-- if we have not reached her by the time you schedule, we can inform her of appointment date and time. Thank you!   3/3  0910  Spoke w/pt and informed her of Pap results and need for Colposcopy. Procedure briefly explained. I stated that she will receive additional information at the time of her appt on 3/24/@ 1315.                   Pt voiced understanding and agreed to appt.   Diane Day RNC

## 2014-05-18 NOTE — Telephone Encounter (Signed)
Attempted to contact patient regarding results. No answer. Left message stating we are calling with results, please call clinic. Message sent to admin pool to schedule and call patient with appointment, however, advised that if we have not reached patient at time of scheduling we will inform her of appointment.

## 2014-05-18 NOTE — Telephone Encounter (Signed)
-----   Message from Leeroy ChaLori Grissett Clemmons, [place] sent at 05/18/2014 11:29 AM EST ----- Yes Thanks  ----- Message -----    From: Louanna Rawaylor M Kimyatta Lecy, RN    Sent: 05/17/2014   9:48 AM      To: Marchelle FolksLori Grissett Clemmons, CNM  Hi Lori,   In reviewing results, noted this patient's pap showed LSIL. Schedule a colpo? Please advise.   Thank you,  Kyung Ruddaylor Edin Kon, RN

## 2014-05-18 NOTE — Telephone Encounter (Signed)
Opened in error

## 2014-06-09 ENCOUNTER — Ambulatory Visit (INDEPENDENT_AMBULATORY_CARE_PROVIDER_SITE_OTHER): Payer: Medicaid Other | Admitting: Obstetrics & Gynecology

## 2014-06-09 ENCOUNTER — Encounter: Payer: Self-pay | Admitting: Obstetrics & Gynecology

## 2014-06-09 ENCOUNTER — Other Ambulatory Visit (HOSPITAL_COMMUNITY)
Admission: RE | Admit: 2014-06-09 | Discharge: 2014-06-09 | Disposition: A | Discharge status: Home / Self Care | Payer: Medicaid Other | Source: Ambulatory Visit | Attending: Obstetrics & Gynecology | Admitting: Obstetrics & Gynecology

## 2014-06-09 VITALS — BP 130/73 | HR 86 | Temp 98.0°F | Ht 67.0 in | Wt 140.5 lb

## 2014-06-09 DIAGNOSIS — Z01818 Encounter for other preprocedural examination: Secondary | ICD-10-CM

## 2014-06-09 DIAGNOSIS — Z3202 Encounter for pregnancy test, result negative: Secondary | ICD-10-CM

## 2014-06-09 DIAGNOSIS — R87619 Unspecified abnormal cytological findings in specimens from cervix uteri: Secondary | ICD-10-CM | POA: Insufficient documentation

## 2014-06-09 DIAGNOSIS — N871 Moderate cervical dysplasia: Secondary | ICD-10-CM | POA: Insufficient documentation

## 2014-06-09 DIAGNOSIS — R87612 Low grade squamous intraepithelial lesion on cytologic smear of cervix (LGSIL): Secondary | ICD-10-CM

## 2014-06-09 LAB — POCT URINALYSIS DIP (DEVICE)
Bilirubin Urine: NEGATIVE
Glucose, UA: 100 mg/dL — AB
Hgb urine dipstick: NEGATIVE
Ketones, ur: NEGATIVE mg/dL
Nitrite: NEGATIVE
Protein, ur: NEGATIVE mg/dL
Specific Gravity, Urine: 1.02 (ref 1.005–1.030)
Urobilinogen, UA: 2 mg/dL — ABNORMAL HIGH (ref 0.0–1.0)
pH: 7 (ref 5.0–8.0)

## 2014-06-09 LAB — POCT PREGNANCY, URINE
Preg Test, Ur: NEGATIVE
Preg Test, Ur: NEGATIVE

## 2014-06-09 NOTE — Progress Notes (Signed)
Patient ID: Doristine MangoBrittany [name], female   DOB: 10/17/1990, 24 y.o.   MRN: [id number] Patient given informed consent, signed copy in the chart, time out was performed.  Placed in lithotomy position. Cervix viewed with speculum and colposcope after application of acetic acid.  05/11/2014 Adequacy Reason Satisfactory for evaluation, endocervical/transformation zone component PRESENT. Diagnosis LOW GRADE SQUAMOUS INTRAEPITHELIAL LESION: CIN-1/ HPV (LSIL). FUNGAL ORGANISMS PRESENT CONSISTENT WITH CANDIDA  Colposcopy adequate?  yes Acetowhite lesions?no Punctation?yes at 5 o'clock Mosaicism?  no Abnormal vasculature?  Yes at 5 o'clock Biopsies?yes at 5 o'clock ECC?no  Patient was given post procedure instructions.  She will return in 1 year for cotesting or sooner if mod or high grade dysplasia.  Burr Soffer L. Harraway-Smith, M.D., Evern CoreFACOG

## 2014-06-09 NOTE — Addendum Note (Signed)
Addended by: Rockwell GermanyFIELDS, Briasia Flinders A on: 06/09/2014 03:28 PM   Modules accepted: Orders

## 2014-06-09 NOTE — Patient Instructions (Signed)

## 2014-06-13 ENCOUNTER — Telehealth: Payer: Self-pay

## 2014-06-13 NOTE — Telephone Encounter (Signed)
Attempted to contact patient. No answer. Left message stating we are calling with results, please call clinic. Message sent to admin pool to schedule LEEP and call patient with appointment-- advised that if we have not reached patient yet, we can inform her of appointment.

## 2014-06-13 NOTE — Telephone Encounter (Signed)
Pt contacted clinic for results, results given with recommendation for LEEP procedure.  Pt verbalizes understanding.  New phone number obtained [phone].

## 2014-06-13 NOTE — Telephone Encounter (Signed)
-----   Message from Willodean Rosenthalarolyn Harraway-Smith, MD sent at 06/13/2014  1:10 PM EDT ----- Please call pt.  She needs a LEEP.  Thx, clh-S

## 2014-06-13 NOTE — Telephone Encounter (Signed)
Per chart review, colpo performed on 06/09/14-- biopsy obtained and no ECC. [facility] informed her of this and that one vial was all that was sent. She verbalized understanding. No further questions.

## 2014-06-13 NOTE — Telephone Encounter (Signed)
-----   Message from Levie HeritageJacob J Stinson, DO sent at 06/10/2014  9:05 AM EDT ----- Regarding: Pathology lab Received call regarding colpo - only received specimen container.  Were the biopsy and the ECC placed in the same container?  Please call pathology LAb: [phone]

## 2014-06-14 ENCOUNTER — Telehealth: Payer: Self-pay

## 2014-06-14 ENCOUNTER — Encounter: Payer: Self-pay | Admitting: *Deleted

## 2014-06-14 NOTE — Telephone Encounter (Signed)
-----   Message from YardvilleBeronica Flores sent at 06/14/2014  9:03 AM EDT ----- 07/14/14 @2 :15 ----- Message -----    From: Louanna Rawaylor M Jobina Maita, RN    Sent: 06/13/2014   4:34 PM      To: [facility] Admin Pool  Please schedule LEEP for patient and call her with appointment-- we have attempted to reach patient-- if we have not reached her when you make the appointment let us know the appointment and we can inform her when we call a second time. THANK YOU!!!

## 2014-06-14 NOTE — Telephone Encounter (Signed)
Called patient and informed her of appointment date, time and location. Patient verbalized understanding. No questions or concerns.

## 2014-06-24 ENCOUNTER — Ambulatory Visit (INDEPENDENT_AMBULATORY_CARE_PROVIDER_SITE_OTHER): Payer: Medicaid Other | Admitting: *Deleted

## 2014-06-24 DIAGNOSIS — Z124 Encounter for screening for malignant neoplasm of cervix: Secondary | ICD-10-CM | POA: Diagnosis present

## 2014-06-24 DIAGNOSIS — Z111 Encounter for screening for respiratory tuberculosis: Secondary | ICD-10-CM

## 2014-06-24 NOTE — Progress Notes (Signed)
PPD placed @ 9:10am on Friday (06/24/14).  Pt will come back to have it read on 06/26/13 @ 8:30am. [name], Maryjo RochesterJessica Dawn

## 2014-06-27 ENCOUNTER — Ambulatory Visit (INDEPENDENT_AMBULATORY_CARE_PROVIDER_SITE_OTHER): Payer: Medicaid Other | Admitting: *Deleted

## 2014-06-27 ENCOUNTER — Encounter: Payer: Self-pay | Admitting: *Deleted

## 2014-06-27 DIAGNOSIS — Z7689 Persons encountering health services in other specified circumstances: Secondary | ICD-10-CM

## 2014-06-27 DIAGNOSIS — Z111 Encounter for screening for respiratory tuberculosis: Secondary | ICD-10-CM

## 2014-06-27 LAB — TB SKIN TEST
Induration: 0 mm
TB Skin Test: NEGATIVE

## 2014-06-27 NOTE — Progress Notes (Signed)
   PPD Reading Note PPD read and results entered in EpicCare. Result: 0 mm induration. Interpretation: Negative If test not read within 48-72 hours of initial placement, patient advised to repeat in other arm 1-3 weeks after this test. Allergic reaction: no  Marty Uy L, RN  

## 2014-06-28 ENCOUNTER — Other Ambulatory Visit: Payer: Self-pay | Admitting: Obstetrics and Gynecology

## 2014-07-14 ENCOUNTER — Other Ambulatory Visit (HOSPITAL_COMMUNITY)
Admission: RE | Admit: 2014-07-14 | Discharge: 2014-07-14 | Disposition: A | Discharge status: Home / Self Care | Payer: Medicaid Other | Source: Ambulatory Visit | Attending: Obstetrics & Gynecology | Admitting: Obstetrics & Gynecology

## 2014-07-14 ENCOUNTER — Encounter: Payer: Self-pay | Admitting: Obstetrics & Gynecology

## 2014-07-14 ENCOUNTER — Ambulatory Visit (INDEPENDENT_AMBULATORY_CARE_PROVIDER_SITE_OTHER): Payer: Medicaid Other | Admitting: Obstetrics & Gynecology

## 2014-07-14 VITALS — BP 143/85 | HR 65 | Temp 97.7°F | Ht 67.0 in | Wt 138.2 lb

## 2014-07-14 DIAGNOSIS — Z3202 Encounter for pregnancy test, result negative: Secondary | ICD-10-CM

## 2014-07-14 DIAGNOSIS — R87619 Unspecified abnormal cytological findings in specimens from cervix uteri: Secondary | ICD-10-CM | POA: Insufficient documentation

## 2014-07-14 DIAGNOSIS — R87612 Low grade squamous intraepithelial lesion on cytologic smear of cervix (LGSIL): Secondary | ICD-10-CM

## 2014-07-14 DIAGNOSIS — N871 Moderate cervical dysplasia: Secondary | ICD-10-CM

## 2014-07-14 HISTORY — PX: LEEP: SHX91

## 2014-07-14 LAB — POCT PREGNANCY, URINE: Preg Test, Ur: NEGATIVE

## 2014-07-14 NOTE — Progress Notes (Signed)
   GYNECOLOGY CLINIC PROCEDURE NOTE  [name] is a 24 y.o. [id number] here for LEEP. No GYN concerns. Pap smear and colposcopy reviewed.    Pap 05/11/14  LGSIL Colpo Biopsy 06/09/14  CIN II  Risks, benefits, alternatives, and limitations of procedure explained to patient, including pain, bleeding, infection, failure to remove abnormal tissue and failure to cure dysplasia, need for repeat procedures, damage to pelvic organs, cervical incompetence.  Role of HPV,cervical dysplasia and need for close followup was empasized. Informed written consent was obtained. All questions were answered. Time out performed.  Procedure: The patient was placed in lithotomy position and the bivalved coated speculum was placed in the patient's vagina. A grounding pad placed on the patient. Lugol's solution was applied to the cervix and areas of decreased uptake were noted around the transformation zone and a large section on the anterior lip.   Local anesthesia was administered via an intracervical block using 10cc of 2% Lidocaine with epinephrine. The suction was turned on and the Large 1X Fisher Cone Biopsy Excisor on 6560 Watts of cutting current was used to excise the area of decreased uptake and excise the entire transformation zone. A secondary pass was done to get the remaining area on the anterior lip. There was significant bleeding noted; about 50 ml.  Good hemostasis was achieved using roller ball coagulation set at 60 Watts coagulation current. Monsel's solution was then applied and the speculum was removed from the vagina. Specimens were sent to pathology.  The patient tolerated the procedure well. Post-operative instructions given to patient, including instruction to seek medical attention for persistent bright red bleeding, fever, abdominal/pelvic pain, dysuria, nausea or vomiting. She was also told about the possibility of having copious yellow to black tinged discharge for weeks. She was counseled to avoid  anything in the vagina (sex/douching/tampons) for 3 weeks. She has a 4 week post-operative check to assess wound healing, review results and discuss further management.   Jaynie CollinsUGONNA  Natassja Ollis, MD, FACOG Attending Obstetrician & Gynecologist Center for [facility], [facility]

## 2014-07-14 NOTE — Patient Instructions (Signed)
Loop Electrosurgical Excision Procedure Care After Refer to this sheet in the next few weeks. These instructions provide you with information on caring for yourself after your procedure. Your caregiver may also give you more specific instructions. Your treatment has been planned according to current medical practices, but problems sometimes occur. Call your caregiver if you have any problems or questions after your procedure. HOME CARE INSTRUCTIONS   Do not use tampons, douche, or have sexual intercourse for 2 weeks or as directed by your caregiver.  Begin normal activities if you have no or minimal cramping or bleeding, unless directed otherwise by your caregiver.  Take your temperature if you feel sick. Write down your temperature on paper, and tell your caregiver if you have a fever.  Take all medicines as directed by your caregiver.  Keep all your follow-up appointments and Pap tests as directed by your caregiver. SEEK IMMEDIATE MEDICAL CARE IF:   You have bleeding that is heavier or longer than a normal menstrual cycle.  You have bleeding that is bright red.  You have blood clots.  You have a fever.  You have increasing cramps or pain not relieved by medicine.  You develop abdominal pain that does not seem to be related to the same area of earlier cramping and pain.  You are lightheaded, unusually weak, or faint.  You develop painful or bloody urination.  You develop a bad smelling vaginal discharge. MAKE SURE YOU:  Understand these instructions.  Will watch your condition.  Will get help right away if you are not doing well or get worse. Document Released: 11/15/2010 Document Revised: 05/27/2011 Document Reviewed: 11/15/2010 [employer] Patient Information 2015 [employer]. This information is not intended to replace advice given to you by your health care provider. Make sure you discuss any questions you have with your health care provider.  

## 2014-07-18 ENCOUNTER — Telehealth: Payer: Self-pay | Admitting: General Practice

## 2014-07-18 NOTE — Telephone Encounter (Signed)
-----   Message from Tereso NewcomerUgonna A Anyanwu, MD sent at 07/18/2014 10:52 AM EDT ----- 07/14/14 LEEP showed CIN II with negative margins, repeat cotesting in 12 months. Please call to inform patient of results and recommendations.

## 2014-07-18 NOTE — Telephone Encounter (Signed)
Called patient and informed her of results and recommendations. Patient verbalized understanding and states she has a follow up appt on 5/27. Told patient yes that is to check to make sure she is healing well after the procedure but she will still need a pap smear in one year. Patient verbalized understanding and had no other questions

## 2014-08-12 ENCOUNTER — Encounter: Payer: Self-pay | Admitting: Obstetrics & Gynecology

## 2014-08-12 ENCOUNTER — Ambulatory Visit (INDEPENDENT_AMBULATORY_CARE_PROVIDER_SITE_OTHER): Payer: Medicaid Other | Admitting: Obstetrics & Gynecology

## 2014-08-12 VITALS — BP 127/90 | HR 71 | Ht 67.0 in | Wt 138.4 lb

## 2014-08-12 DIAGNOSIS — Z975 Presence of (intrauterine) contraceptive device: Secondary | ICD-10-CM | POA: Diagnosis not present

## 2014-08-12 DIAGNOSIS — N871 Moderate cervical dysplasia: Secondary | ICD-10-CM

## 2014-08-12 DIAGNOSIS — Z3041 Encounter for surveillance of contraceptive pills: Secondary | ICD-10-CM

## 2014-08-12 MED ORDER — NORGESTIMATE-ETH ESTRADIOL 0.25-35 MG-MCG PO TABS: 1.0000 | ORAL_TABLET | Freq: Every day | ORAL | Status: DC

## 2014-08-12 NOTE — Patient Instructions (Addendum)
Return to clinic for any scheduled appointments or for any gynecologic concerns as needed.    HPV Vaccine Gardasil (Human Papillomavirus): What You Need to Know 1. What is HPV? Genital human papillomavirus (HPV) is the most common sexually transmitted virus in the [place]. More than half of sexually active men and women are infected with HPV at some time in their lives. About 20 million Americans are currently infected, and about 6 million more get infected each year. HPV is usually spread through sexual contact. Most HPV infections don't cause any symptoms, and go away on their own. But HPV can cause cervical cancer in women. Cervical cancer is the 2nd leading cause of cancer deaths among women around the world. In the [place], about 12,000 women get cervical cancer every year and about 4,000 are expected to die from it. HPV is also associated with several less common cancers, such as vaginal and vulvar cancers in women, and anal and oropharyngeal (back of the throat, including base of tongue and tonsils) cancers in both men and women. HPV can also cause genital warts and warts in the throat. There is no cure for HPV infection, but some of the problems it causes can be treated. 2. HPV vaccine: Why get vaccinated? The HPV vaccine you are getting is one of two vaccines that can be given to prevent HPV. It may be given to both males and females.  This vaccine can prevent most cases of cervical cancer in females, if it is given before exposure to the virus. In addition, it can prevent vaginal and vulvar cancer in females, and genital warts and anal cancer in both males and females. Protection from HPV vaccine is expected to be long-lasting. But vaccination is not a substitute for cervical cancer screening. Women should still get regular Pap tests. 3. Who should get this HPV vaccine and when? HPV vaccine is given as a 3-dose series  1st Dose: Now  2nd Dose: 1 to 2 months after Dose  1  3rd Dose: 6 months after Dose 1 Additional (booster) doses are not recommended. Routine vaccination  This HPV vaccine is recommended for girls and boys 38 or 26 years of age. It may be given starting at age 56. Why is HPV vaccine recommended at 46 or 26 years of age?  HPV infection is easily acquired, even with only one sex partner. That is why it is important to get HPV vaccine before any sexual contact takes place. Also, response to the vaccine is better at this age than at older ages. Catch-up vaccination This vaccine is recommended for the following people who have not completed the 3-dose series:   Females 13 through 26 years of age.  Males 13 through 26 years of age. This vaccine may be given to men 22 through 26 years of age who have not completed the 3-dose series. It is recommended for men through age 80 who have sex with men or whose immune system is weakened because of HIV infection, other illness, or medications.  HPV vaccine may be given at the same time as other vaccines. 4. Some people should not get HPV vaccine or should wait.  Anyone who has ever had a life-threatening allergic reaction to any component of HPV vaccine, or to a previous dose of HPV vaccine, should not get the vaccine. Tell your doctor if the person getting vaccinated has any severe allergies, including an allergy to yeast.  HPV vaccine is not recommended for pregnant women. However, receiving  HPV vaccine when pregnant is not a reason to consider terminating the pregnancy. Women who are breast feeding may get the vaccine.  People who are mildly ill when a dose of HPV is planned can still be vaccinated. People with a moderate or severe illness should wait until they are better. 5. What are the risks from this vaccine? This HPV vaccine has been used in the [place] and around the world for about six years and has been very safe. However, any medicine could possibly cause a serious problem, such as a severe  allergic reaction. The risk of any vaccine causing a serious injury, or death, is extremely small. Life-threatening allergic reactions from vaccines are very rare. If they do occur, it would be within a few minutes to a few hours after the vaccination. Several mild to moderate problems are known to occur with this HPV vaccine. These do not last long and go away on their own.  Reactions in the arm where the shot was given:  Pain (about 8 people in 10)  Redness or swelling (about 1 person in 4)  Fever:  Mild (100 F) (about 1 person in 10)  Moderate (102 F) (about 1 person in 7865)  Other problems:  Headache (about 1 person in 3)  Fainting: Brief fainting spells and related symptoms (such as jerking movements) can happen after any medical procedure, including vaccination. Sitting or lying down for about 15 minutes after a vaccination can help prevent fainting and injuries caused by falls. Tell your doctor if the patient feels dizzy or light-headed, or has vision changes or ringing in the ears.  Like all vaccines, HPV vaccines will continue to be monitored for unusual or severe problems. 6. What if there is a serious reaction? What should I look for?  Look for anything that concerns you, such as signs of a severe allergic reaction, very high fever, or behavior changes. Signs of a severe allergic reaction can include hives, swelling of the face and throat, difficulty breathing, a fast heartbeat, dizziness, and weakness. These would start a few minutes to a few hours after the vaccination.  What should I do?  If you think it is a severe allergic reaction or other emergency that can't wait, call 9-1-1 or get the person to the nearest hospital. Otherwise, call your doctor.  Afterward, the reaction should be reported to the Vaccine Adverse Event Reporting System (VAERS). Your doctor might file this report, or you can do it yourself through the VAERS web site at www.vaers.LAgents.nohhs.gov, or by calling  1-(431)544-2421. VAERS is only for reporting reactions. They do not give medical advice. 7. The National Vaccine Injury Compensation Program  The [employer] Vaccine Injury Compensation Program (VICP) is a federal program that was created to compensate people who may have been injured by certain vaccines.  Persons who believe they may have been injured by a vaccine can learn about the program and about filing a claim by calling 1-832-487-6156 or visiting the VICP website at SpiritualWord.atwww.hrsa.gov/vaccinecompensation. 8. How can I learn more?  Ask your doctor.  Call your local or state health department.  Contact the Centers for Disease Control and Prevention (CDC):  Call 308-719-84871-930-185-2026 (1-800-CDC-INFO)  or  Visit CDC's website at PicCapture.uywww.cdc.gov/vaccines CDC Human Papillomavirus (HPV) Gardasil (Interim) 08/02/11 Document Released: 12/30/2005 Document Revised: 07/19/2013 Document Reviewed: 04/15/2013 ExitCare Patient Information 2015 [employer]. This information is not intended to replace advice given to you by your health care provider. Make sure you discuss any questions you have  with your health care provider.  Thank you for enrolling in MyChart. Please follow the instructions below to securely access your online medical record. MyChart allows you to send messages to your doctor, view your test results, manage appointments, and more.   How Do I Sign Up? 1. In your Internet browser, go to [employer] Address [employer] and enter [email or web address]. 2. Click on the Sign Up Now link in the Sign In box. You will see the New Member Sign Up page. 3. Enter your MyChart Access Code exactly as it appears below. You will not need to use this code after you've completed the sign-up process. If you do not sign up before the expiration date, you must request a new code.  MyChart Access Code: [email or web address] Expires: 10/11/2014 10:13 AM  4. Enter your Social Security Number ([id number]) and Date of Birth  (mm/dd/yyyy) as indicated and click Submit. You will be taken to the next sign-up page. 5. Create a MyChart ID. This will be your MyChart login ID and cannot be changed, so think of one that is secure and easy to remember. 6. Create a MyChart password. You can change your password at any time. 7. Enter your Password Reset Question and Answer. This can be used at a later time if you forget your password.  8. Enter your e-mail address. You will receive e-mail notification when new information is available in MyChart. 9. Click Sign Up. You can now view your medical record.   Additional Information Remember, MyChart is NOT to be used for urgent needs. For medical emergencies, dial 911.

## 2014-08-12 NOTE — Progress Notes (Signed)
   CLINIC ENCOUNTER NOTE  History:  24 y.o. [id number] here today for evaluation after LEEP that was done on 07/14/14.  She reports having discharge after procedure and scant bleeding, no pelvic pain.  No other symptoms. Had sexual intercourse, no problems.  Desires refill of OCPs which was prescribed for breakthrough bleeding on Nexplanon. Patient wants to continue OCPs and the Nexplanon.  Past Medical History  Diagnosis Date  . Anemia   . Scoliosis   . Heart murmur   . CIN II (cervical intraepithelial neoplasia II)   . History of gestational hypertension     Past Surgical History  Procedure Laterality Date  . Leep  07/14/14    CIN II with negative margins   The following portions of the patient's history were reviewed and updated as appropriate: allergies, current medications, past family history, past medical history, past social history, past surgical history and problem list.   Review of Systems:  Pertinent items are noted in HPI. Comprehensive review of systems was otherwise negative.  Objective:  Physical Exam BP 127/90 mmHg  Pulse 71  Ht 5\' 7"  (1.702 m)  Wt 138 lb 6.4 oz (62.778 kg)  BMI 21.67 kg/m2 CONSTITUTIONAL: Well-developed, well-nourished female in no acute distress.  HENT:  Normocephalic, atraumatic, External right and left ear normal. Oropharynx is clear and moist EYES: Conjunctivae and EOM are normal. Pupils are equal, round, and reactive to light. No scleral icterus.  NECK: Normal range of motion, supple, no masses SKIN: Skin is warm and dry. No rash noted. Not diaphoretic. No erythema. No pallor. NEUROLGIC: Alert and oriented to person, place, and time. Normal reflexes, muscle tone coordination. No cranial nerve deficit noted. PSYCHIATRIC: Normal mood and affect. Normal behavior. Normal judgment and thought content. CARDIOVASCULAR: Normal heart rate noted RESPIRATORY: Effort and breath sounds normal, no problems with respiration noted ABDOMEN: Soft, no  distention noted.  No tenderness, rebound or guarding.  PELVIC: Normal appearing external genitalia; normal appearing vaginal mucosa and well-healed cervix and LEEP site.  Normal appearing discharge.   MUSCULOSKELETAL: Normal range of motion. No edema and no tenderness.  Labs and Imaging Cervix, LEEP (07/14/14) - HIGH GRADE SQUAMOUS INTRAEPITHELIAL LESION, CIN-II (MODERATE DYSPLASIA). - ASSOCIATED LOW GRADE SQUAMOUS INTRAEPITHELIAL LESION, CIN-I (MILD DYSPLASIA). - RESECTION MARGINS ARE NEGATIVE FOR DYSPLASIA.  Assessment & Plan:  Normal exam after LEEP. LEEP showed CIN II with negative margins, plan is to repeat cotesting in 12 months.  Counseled about continuing both Nexplanon and OCPs and increased hormones which could lead to increased side effects; patient does not want to remove the Nexplanon until next year when it is due to come out. She desires the "double protection".  OCPs refilled as per patient request. Routine preventative health maintenance measures emphasized.   Jaynie CollinsUGONNA  Ilene Witcher, MD, FACOG Attending Obstetrician & Gynecologist Center [facility], [facility]

## 2014-08-25 ENCOUNTER — Telehealth: Payer: Self-pay | Admitting: General Practice

## 2014-08-25 NOTE — Telephone Encounter (Signed)
Patient called and left message stating she wants to know if it's natural to have the problems she's having even though she's on two kinds of birth control. Called patient, no answer- left message stating we are trying to return your phone call please call us back at the clinics

## 2014-08-29 NOTE — Telephone Encounter (Signed)
Patient called back to front office and left message stating please call her back. Called patient and she states she has been taking the birth control pills for several months now and for the past month she has had some bleeding and wants to know if this is normal or related to the procedure. Told patient that irregular bleeding can happen with the nexplanon even being on birth control pills. Told patient that I do not think it is related to the procedure. Patient verbalized understanding to all and had no other questions at this time.

## 2014-09-02 ENCOUNTER — Emergency Department (HOSPITAL_COMMUNITY)
Admission: EM | Admit: 2014-09-02 | Discharge: 2014-09-02 | Disposition: A | Discharge status: Home / Self Care | Payer: Medicaid Other | Attending: Emergency Medicine | Admitting: Emergency Medicine

## 2014-09-02 ENCOUNTER — Encounter (HOSPITAL_COMMUNITY): Payer: Self-pay | Admitting: *Deleted

## 2014-09-02 DIAGNOSIS — M419 Scoliosis, unspecified: Secondary | ICD-10-CM | POA: Diagnosis not present

## 2014-09-02 DIAGNOSIS — R000 Tachycardia, unspecified: Secondary | ICD-10-CM | POA: Insufficient documentation

## 2014-09-02 DIAGNOSIS — R011 Cardiac murmur, unspecified: Secondary | ICD-10-CM | POA: Insufficient documentation

## 2014-09-02 DIAGNOSIS — Z8742 Personal history of other diseases of the female genital tract: Secondary | ICD-10-CM | POA: Insufficient documentation

## 2014-09-02 DIAGNOSIS — Z793 Long term (current) use of hormonal contraceptives: Secondary | ICD-10-CM | POA: Diagnosis not present

## 2014-09-02 DIAGNOSIS — M791 Myalgia: Secondary | ICD-10-CM | POA: Diagnosis present

## 2014-09-02 DIAGNOSIS — Z3202 Encounter for pregnancy test, result negative: Secondary | ICD-10-CM | POA: Insufficient documentation

## 2014-09-02 DIAGNOSIS — B349 Viral infection, unspecified: Secondary | ICD-10-CM | POA: Diagnosis not present

## 2014-09-02 DIAGNOSIS — Z862 Personal history of diseases of the blood and blood-forming organs and certain disorders involving the immune mechanism: Secondary | ICD-10-CM | POA: Insufficient documentation

## 2014-09-02 LAB — I-STAT BETA HCG BLOOD, ED (MC, WL, AP ONLY): I-stat hCG, quantitative: <5 m[IU]/mL (ref <5)

## 2014-09-02 LAB — I-STAT CHEM 8, ED
BUN: 6 mg/dL (ref 6–20)
Calcium, Ion: 1.15 mmol/L (ref 1.12–1.23)
Chloride: 103 mmol/L (ref 101–111)
Creatinine, Ser: 0.8 mg/dL (ref 0.44–1.00)
Glucose, Bld: 95 mg/dL (ref 65–99)
HCT: 38 % (ref 36.0–46.0)
Hemoglobin: 12.9 g/dL (ref 12.0–15.0)
Potassium: 3.5 mmol/L (ref 3.5–5.1)
Sodium: 138 mmol/L (ref 135–145)
TCO2: 24 mmol/L (ref 0–100)

## 2014-09-02 LAB — RAPID STREP SCREEN (MED CTR MEBANE ONLY): Streptococcus, Group A Screen (Direct): NEGATIVE

## 2014-09-02 MED ORDER — NAPROXEN 500 MG PO TABS: 500.0000 mg | ORAL_TABLET | Freq: Two times a day (BID) | ORAL | Status: DC

## 2014-09-02 MED ORDER — FLUTICASONE PROPIONATE 50 MCG/ACT NA SUSP: 2.0000 | Freq: Every day | NASAL | Status: DC

## 2014-09-02 MED ORDER — MAGIC MOUTHWASH W/LIDOCAINE: 5.0000 mL | Freq: Four times a day (QID) | ORAL | Status: DC | PRN

## 2014-09-02 MED ORDER — NAPROXEN 500 MG PO TABS
500.0000 mg | ORAL_TABLET | Freq: Once | ORAL | Status: AC
  Administered 2014-09-02: 500 mg via ORAL
  Filled 2014-09-02: qty 1

## 2014-09-02 NOTE — Discharge Instructions (Signed)
Upper Respiratory Infection, Adult An upper respiratory infection (URI) is also sometimes known as the common cold. The upper respiratory tract includes the nose, sinuses, throat, trachea, and bronchi. Bronchi are the airways leading to the lungs. Most people improve within 1 week, but symptoms can last up to 2 weeks. A residual cough may last even longer.  CAUSES Many different viruses can infect the tissues lining the upper respiratory tract. The tissues become irritated and inflamed and often become very moist. Mucus production is also common. A cold is contagious. You can easily spread the virus to others by oral contact. This includes kissing, sharing a glass, coughing, or sneezing. Touching your mouth or nose and then touching a surface, which is then touched by another person, can also spread the virus. SYMPTOMS  Symptoms typically develop 1 to 3 days after you come in contact with a cold virus. Symptoms vary from person to person. They may include:  Runny nose.  Sneezing.  Nasal congestion.  Sinus irritation.  Sore throat.  Loss of voice (laryngitis).  Cough.  Fatigue.  Muscle aches.  Loss of appetite.  Headache.  Low-grade fever. DIAGNOSIS  You might diagnose your own cold based on familiar symptoms, since most people get a cold 2 to 3 times a year. Your caregiver can confirm this based on your exam. Most importantly, your caregiver can check that your symptoms are not due to another disease such as strep throat, sinusitis, pneumonia, asthma, or epiglottitis. Blood tests, throat tests, and X-rays are not necessary to diagnose a common cold, but they may sometimes be helpful in excluding other more serious diseases. Your caregiver will decide if any further tests are required. RISKS AND COMPLICATIONS  You may be at risk for a more severe case of the common cold if you smoke cigarettes, have chronic heart disease (such as heart failure) or lung disease (such as asthma), or if  you have a weakened immune system. The very young and very old are also at risk for more serious infections. Bacterial sinusitis, middle ear infections, and bacterial pneumonia can complicate the common cold. The common cold can worsen asthma and chronic obstructive pulmonary disease (COPD). Sometimes, these complications can require emergency medical care and may be life-threatening. PREVENTION  The best way to protect against getting a cold is to practice good hygiene. Avoid oral or hand contact with people with cold symptoms. Wash your hands often if contact occurs. There is no clear evidence that vitamin C, vitamin E, echinacea, or exercise reduces the chance of developing a cold. However, it is always recommended to get plenty of rest and practice good nutrition. TREATMENT  Treatment is directed at relieving symptoms. There is no cure. Antibiotics are not effective, because the infection is caused by a virus, not by bacteria. Treatment may include:  Increased fluid intake. Sports drinks offer valuable electrolytes, sugars, and fluids.  Breathing heated mist or steam (vaporizer or shower).  Eating chicken soup or other clear broths, and maintaining good nutrition.  Getting plenty of rest.  Using gargles or lozenges for comfort.  Controlling fevers with ibuprofen or acetaminophen as directed by your caregiver.  Increasing usage of your inhaler if you have asthma. Zinc gel and zinc lozenges, taken in the first 24 hours of the common cold, can shorten the duration and lessen the severity of symptoms. Pain medicines may help with fever, muscle aches, and throat pain. A variety of non-prescription medicines are available to treat congestion and runny nose. Your caregiver   can make recommendations and may suggest nasal or lung inhalers for other symptoms.  HOME CARE INSTRUCTIONS   Only take over-the-counter or prescription medicines for pain, discomfort, or fever as directed by your  caregiver.  Use a warm mist humidifier or inhale steam from a shower to increase air moisture. This may keep secretions moist and make it easier to breathe.  Drink enough water and fluids to keep your urine clear or pale yellow.  Rest as needed.  Return to work when your temperature has returned to normal or as your caregiver advises. You may need to stay home longer to avoid infecting others. You can also use a face mask and careful hand washing to prevent spread of the virus. SEEK MEDICAL CARE IF:   After the first few days, you feel you are getting worse rather than better.  You need your caregiver's advice about medicines to control symptoms.  You develop chills, worsening shortness of breath, or brown or red sputum. These may be signs of pneumonia.  You develop yellow or brown nasal discharge or pain in the face, especially when you bend forward. These may be signs of sinusitis.  You develop a fever, swollen neck glands, pain with swallowing, or white areas in the back of your throat. These may be signs of strep throat. SEEK IMMEDIATE MEDICAL CARE IF:   You have a fever.  You develop severe or persistent headache, ear pain, sinus pain, or chest pain.  You develop wheezing, a prolonged cough, cough up blood, or have a change in your usual mucus (if you have chronic lung disease).  You develop sore muscles or a stiff neck. Document Released: 08/28/2000 Document Revised: 05/27/2011 Document Reviewed: 06/09/2013 ExitCare Patient Information 2015 [employer]. This information is not intended to replace advice given to you by your health care provider. Make sure you discuss any questions you have with your health care provider.  

## 2014-09-02 NOTE — ED Provider Notes (Signed)
CSN: [id number]     Arrival date & time 09/02/14  1824 History  This chart was scribed for non-physician provider Antony Madura, PA-C, working with Purvis Sheffield, MD by Phillis Haggis, ED Scribe. This patient was seen in room WTR9/WTR9 and patient care was started at 8:28 PM.   Chief Complaint  Patient presents with  . Generalized Body Aches   The history is provided by the patient. No language interpreter was used.    HPI Comments: [name] is a 24 y.o. female with a history of HTN who presents to the Emergency Department complaining of generalized body aches onset earlier today. She states that it first started with her whole body feeling hot, and that she now has pressured right otalgia and sore throat. She states that she had an instance of SOB that lasted a few seconds but does not have any now; states that she also has sinus congestion that she attributes to allergies. She states that her children have runny noses and came back Tuesday night but denies that they had a fever. She states that she has been drinking fluids today. She denies fever, dysuria, chills, neck pain or neck stiffness, diarrhea, hematuria, nausea, or vomiting. She denies taking anything for the pain. She states that she has not been taking her HTN medication because she cannot afford it; states that she has not taken it in 4 months. She reports that her last menstrual period was last week. She states that she sees [facility] for her PCP. Patient denies any known tick bites or possible tick exposure.   Past Medical History  Diagnosis Date  . Anemia   . Scoliosis   . Heart murmur   . CIN II (cervical intraepithelial neoplasia II)   . History of gestational hypertension    Past Surgical History  Procedure Laterality Date  . Leep  07/14/14    CIN II with negative margins   Family History  Problem Relation Age of Onset  . Anemia Mother   . Migraines Mother   . Mitral valve prolapse Sister   .  Heart murmur Brother   . Heart attack Maternal Grandmother   . Mitral valve prolapse Maternal Grandmother   . Heart disease Maternal Grandmother   . Diabetes Maternal Grandmother   . Hyperlipidemia Maternal Grandmother   . Hypertension Maternal Grandmother    History  Substance Use Topics  . Smoking status: Never Smoker   . Smokeless tobacco: Never Used  . Alcohol Use: No   OB History    Gravida Para Term Preterm AB TAB SAB Ectopic Multiple Living   2 2 2  0 0 0 0 0 0 2      Review of Systems  Constitutional: Negative for fever and chills.  HENT: Positive for congestion, ear pain and sore throat.   Respiratory: Negative for shortness of breath.   Gastrointestinal: Negative for nausea, vomiting and diarrhea.  Genitourinary: Negative for dysuria and hematuria.  Musculoskeletal: Positive for myalgias. Negative for neck pain and neck stiffness.  Neurological: Positive for headaches.  All other systems reviewed and are negative.   Allergies  Review of patient's allergies indicates no known allergies.  Home Medications   Prior to Admission medications   Medication Sig Start Date End Date Taking? Authorizing Provider  norgestimate-ethinyl estradiol (MONONESSA) 0.25-35 MG-MCG tablet Take 1 tablet by mouth daily. 08/12/14   Jethro Bastos Anyanwu, MD   BP 146/90 mmHg  Pulse 107  Temp(Src) 99.3 F (37.4 C) (Oral)  Resp 18  SpO2 100%  LMP 08/22/2014  Physical Exam  Constitutional: She is oriented to person, place, and time. She appears well-developed and well-nourished. No distress.  Nontoxic/nonseptic appearing  HENT:  Head: Normocephalic and atraumatic.  Right Ear: Hearing, tympanic membrane and ear canal normal.  Left Ear: Hearing, tympanic membrane and ear canal normal.  Nose: No rhinorrhea.  Mouth/Throat: Uvula is midline and mucous membranes are normal. Posterior oropharyngeal erythema present. No oropharyngeal exudate or posterior oropharyngeal edema.  Audible nasal  congestion. Mild posterior oropharyngeal erythema. No edema or exudates. Uvula midline. Patient tolerating secretions without difficulty.  Eyes: Conjunctivae and EOM are normal. Pupils are equal, round, and reactive to light. No scleral icterus.  Neck: Normal range of motion.  No nuchal rigidity or meningismus  Cardiovascular: Regular rhythm and intact distal pulses.   Mild tachycardia  Pulmonary/Chest: Effort normal and breath sounds normal. No respiratory distress. She has no wheezes. She has no rales.  Respirations even and unlabored. Lungs clear. No accessory muscle use.  Abdominal: Soft. She exhibits no distension. There is no tenderness. There is no rebound and no guarding.  Soft abdomen without masses or peritoneal signs  Musculoskeletal: Normal range of motion.  Neurological: She is alert and oriented to person, place, and time. She exhibits normal muscle tone. Coordination normal.  Skin: Skin is warm and dry. No rash noted. She is not diaphoretic. No erythema. No pallor.  Psychiatric: She has a normal mood and affect. Her behavior is normal.  Nursing note and vitals reviewed.   ED Course  Procedures (including critical care time) DIAGNOSTIC STUDIES: Oxygen Saturation is 100% on RA, normal by my interpretation.    COORDINATION OF CARE: 8:34 PM-Discussed treatment plan which includes labs with pt at bedside and pt agreed to plan.   Labs Review Labs Reviewed  RAPID STREP SCREEN (NOT AT [facility])  CULTURE, GROUP A STREP  I-STAT BETA HCG BLOOD, ED ([facility], [facility], [facility] ONLY)  I-STAT CHEM 8, ED   Imaging Review No results found.   EKG Interpretation None      MDM   Final diagnoses:  Viral illness    24 year old female presents to the emergency department for further evaluation of generalized body aches with nasal congestion, mild sore throat, and right ear pressure. She reports that her children are sick with upper respiratory symptoms. Patient is afebrile and well-appearing in the  emergency department. Chemistry shows no electrolyte imbalance or anemia. Urine pregnancy and strep screen negative. As patient's children are showing similar symptoms, suspect that her symptoms today are secondary to a viral illness. We will manage supportively as outpatient with instruction to return if symptoms worsen. Patient agreeable to plan with known addressed concerns. Patient discharged in good condition.  I personally performed the services described in this documentation, which was scribed in my presence. The recorded information has been reviewed and is accurate.   Filed Vitals:   09/02/14 1915  BP: 146/90  Pulse: 107  Temp: 99.3 F (37.4 C)  TempSrc: Oral  Resp: 18  SpO2: 100%      Antony Madura, PA-C 09/03/14 0530  Purvis Sheffield, MD 09/03/14 1017

## 2014-09-02 NOTE — ED Notes (Signed)
Pt states that she began having generalized body aches today; pt denies N/V; pt states that the aches just began today but they are to her whole body; pt c/o mild headache; no neck stiffness or pain; no diarrhea

## 2014-09-05 LAB — CULTURE, GROUP A STREP: Strep A Culture: NEGATIVE

## 2014-11-20 ENCOUNTER — Encounter (HOSPITAL_COMMUNITY): Payer: Self-pay

## 2014-11-20 ENCOUNTER — Emergency Department (HOSPITAL_COMMUNITY)
Admission: EM | Admit: 2014-11-20 | Discharge: 2014-11-20 | Disposition: A | Discharge status: Home / Self Care | Payer: Medicaid Other | Attending: Emergency Medicine | Admitting: Emergency Medicine

## 2014-11-20 DIAGNOSIS — Z791 Long term (current) use of non-steroidal anti-inflammatories (NSAID): Secondary | ICD-10-CM | POA: Diagnosis not present

## 2014-11-20 DIAGNOSIS — R011 Cardiac murmur, unspecified: Secondary | ICD-10-CM | POA: Diagnosis not present

## 2014-11-20 DIAGNOSIS — Z793 Long term (current) use of hormonal contraceptives: Secondary | ICD-10-CM | POA: Diagnosis not present

## 2014-11-20 DIAGNOSIS — K0381 Cracked tooth: Secondary | ICD-10-CM | POA: Insufficient documentation

## 2014-11-20 DIAGNOSIS — Z8742 Personal history of other diseases of the female genital tract: Secondary | ICD-10-CM | POA: Diagnosis not present

## 2014-11-20 DIAGNOSIS — K047 Periapical abscess without sinus: Secondary | ICD-10-CM | POA: Diagnosis not present

## 2014-11-20 DIAGNOSIS — Z7951 Long term (current) use of inhaled steroids: Secondary | ICD-10-CM | POA: Insufficient documentation

## 2014-11-20 DIAGNOSIS — J029 Acute pharyngitis, unspecified: Secondary | ICD-10-CM | POA: Insufficient documentation

## 2014-11-20 DIAGNOSIS — Z862 Personal history of diseases of the blood and blood-forming organs and certain disorders involving the immune mechanism: Secondary | ICD-10-CM | POA: Diagnosis not present

## 2014-11-20 DIAGNOSIS — M419 Scoliosis, unspecified: Secondary | ICD-10-CM | POA: Diagnosis not present

## 2014-11-20 DIAGNOSIS — R220 Localized swelling, mass and lump, head: Secondary | ICD-10-CM

## 2014-11-20 MED ORDER — NAPROXEN 500 MG PO TABS: 500.0000 mg | ORAL_TABLET | Freq: Two times a day (BID) | ORAL | Status: DC

## 2014-11-20 MED ORDER — PENICILLIN V POTASSIUM 500 MG PO TABS
500.0000 mg | ORAL_TABLET | Freq: Four times a day (QID) | ORAL | Status: DC
Start: 2014-11-20 — End: 2015-09-21

## 2014-11-20 NOTE — ED Notes (Signed)
Pt. Presents with complaint of L eye/cheekbone swelling starting yesterday AM. Pt. Denies new use of medications/lotions/soaps. Pt. Denies injury to area. Pt. Also states her throat is sore.

## 2014-11-20 NOTE — Discharge Instructions (Signed)
Take the prescribed medication as directed. Follow-up with your dentist-- recommend appt as soon as possible for re-check. Return to the ED for new or worsening symptoms.

## 2014-11-20 NOTE — ED Provider Notes (Signed)
CSN: [id number]     Arrival date & time 11/20/14  4098 History   First MD Initiated Contact with Patient 11/20/14 [phone]     Chief Complaint  Patient presents with  . Facial Swelling  . Sore Throat     (Consider location/radiation/quality/duration/timing/severity/associated sxs/prior Treatment) Patient is a 24 y.o. female presenting with pharyngitis. The history is provided by the patient and medical records.  Sore Throat Associated symptoms include a sore throat.     24 year old female with history of anemia, scoliosis, cervical neoplasia, presenting to the ED for left cheek swelling and left upper dental pain since yesterday morning. Patient states she woke up and had some swelling of left upper cheek which has persisted until now. She states she has a left upper tooth that has started to cause some pain as well. States she does have a mild sore throat, no difficulty swallowing. She denies any new soap, detergent, or other personal care products. No facial injuries. No shortness of breath. No new medications.  Patient is currently established with a dentist, cannot recall name but office is on [address].  VSS.  Past Medical History  Diagnosis Date  . Anemia   . Scoliosis   . Heart murmur   . CIN II (cervical intraepithelial neoplasia II)   . History of gestational hypertension    Past Surgical History  Procedure Laterality Date  . Leep  07/14/14    CIN II with negative margins   Family History  Problem Relation Age of Onset  . Anemia Mother   . Migraines Mother   . Mitral valve prolapse Sister   . Heart murmur Brother   . Heart attack Maternal Grandmother   . Mitral valve prolapse Maternal Grandmother   . Heart disease Maternal Grandmother   . Diabetes Maternal Grandmother   . Hyperlipidemia Maternal Grandmother   . Hypertension Maternal Grandmother    Social History  Substance Use Topics  . Smoking status: Never Smoker   . Smokeless tobacco: Never Used  .  Alcohol Use: No   OB History    Gravida Para Term Preterm AB TAB SAB Ectopic Multiple Living   0 0 0 0 0 0 2     Review of Systems  HENT: Positive for dental problem, facial swelling and sore throat.   All other systems reviewed and are negative.     Allergies  Review of patient's allergies indicates no known allergies.  Home Medications   Prior to Admission medications   Medication Sig Start Date End Date Taking? Authorizing Provider  Alum & Mag Hydroxide-Simeth (MAGIC MOUTHWASH W/LIDOCAINE) SOLN Take 5 mLs by mouth 4 (four) times daily as needed for mouth pain. 09/02/14   Antony Madura, PA-C  fluticasone (FLONASE) 50 MCG/ACT nasal spray Place 2 sprays into both nostrils daily. 09/02/14   Antony Madura, PA-C  naproxen (NAPROSYN) 500 MG tablet Take 1 tablet (500 mg total) by mouth 2 (two) times daily. 09/02/14   Antony Madura, PA-C  norgestimate-ethinyl estradiol (MONONESSA) 0.25-35 MG-MCG tablet Take 1 tablet by mouth daily. 08/12/14   Jethro Bastos Anyanwu, MD   BP 135/97 mmHg  Pulse 80  Temp(Src) 98.8 F (37.1 C) (Oral)  Resp 16  Ht  (1.702 m)  Wt 130 lb (58.968 kg)  BMI 20.36 kg/m2  SpO2 99%  LMP 11/17/2014   Physical Exam  Constitutional: She is oriented to person, place, and time. She appears well-developed and well-nourished. No distress.  HENT:  Head: Normocephalic  and atraumatic.  Mouth/Throat: Uvula is midline, oropharynx is clear and moist and mucous membranes are normal.  Teeth largely in good dentition, left upper pre-molar cracked and TTP, surrounding gingiva slightly swollen without discoloration or definitive abscess formation Tonsils sminimally enlarged bilaterally without exudate; uvula midline without peritonsillar abscess; handling secretions appropriately; no difficulty swallowing or speaking; slight swelling of left upper cheek without extension into eye; no neck swelling; normal phonation, no stridor  Eyes: Conjunctivae and EOM are normal. Pupils are  equal, round, and reactive to light.  Neck: Normal range of motion.  Cardiovascular: Normal rate, regular rhythm and normal heart sounds.   Pulmonary/Chest: Effort normal and breath sounds normal. No respiratory distress. She has no wheezes.  Musculoskeletal: Normal range of motion.  Neurological: She is alert and oriented to person, place, and time.  Skin: Skin is warm and dry. She is not diaphoretic.  Psychiatric: She has a normal mood and affect.  Nursing note and vitals reviewed.   ED Course  Procedures (including critical care time) Labs Review Labs Reviewed - No data to display  Imaging Review No results found.   EKG Interpretation None      MDM   Final diagnoses:  Dental infection  Swelling, cheek   24 year old female with left upper cheek swelling and dental pain onset yesterday morning. Symptoms persisted until today. Patient is afebrile, nontoxic. Chest tenderness of her left upper premolar with associated upper cheek swelling. There is no extension into neck. She is having no difficulty handling her secretions, no trismus. Normal phonation, no stridor. Tonsils slightly enlarged without exudates, doubt strep throat. Do not suspect Ludwig's angina. Patient will be started on antibiotics and referred back to her dentist for further work-up.  Discussed plan with patient, he/she acknowledged understanding and agreed with plan of care.  Return precautions given for new or worsening symptoms.  Garlon Hatchet, PA-C 11/20/14 1421  Melene Plan, DO 11/20/14 [phone]

## 2014-12-08 ENCOUNTER — Ambulatory Visit (INDEPENDENT_AMBULATORY_CARE_PROVIDER_SITE_OTHER): Payer: Medicaid Other | Admitting: Obstetrics & Gynecology

## 2014-12-08 ENCOUNTER — Ambulatory Visit: Payer: Medicaid Other | Admitting: Obstetrics & Gynecology

## 2014-12-08 ENCOUNTER — Encounter: Payer: Self-pay | Admitting: Obstetrics & Gynecology

## 2014-12-08 VITALS — BP 140/95 | HR 69 | Temp 98.5°F | Ht 67.0 in | Wt 143.0 lb

## 2014-12-08 DIAGNOSIS — Z7185 Encounter for immunization safety counseling: Secondary | ICD-10-CM

## 2014-12-08 DIAGNOSIS — Z7189 Other specified counseling: Secondary | ICD-10-CM

## 2014-12-08 DIAGNOSIS — Z975 Presence of (intrauterine) contraceptive device: Secondary | ICD-10-CM

## 2014-12-08 DIAGNOSIS — N921 Excessive and frequent menstruation with irregular cycle: Secondary | ICD-10-CM

## 2014-12-08 DIAGNOSIS — N871 Moderate cervical dysplasia: Secondary | ICD-10-CM

## 2014-12-08 DIAGNOSIS — Z3046 Encounter for surveillance of implantable subdermal contraceptive: Secondary | ICD-10-CM

## 2014-12-08 DIAGNOSIS — Z3049 Encounter for surveillance of other contraceptives: Secondary | ICD-10-CM

## 2014-12-08 DIAGNOSIS — Z3202 Encounter for pregnancy test, result negative: Secondary | ICD-10-CM | POA: Diagnosis not present

## 2014-12-08 DIAGNOSIS — Z23 Encounter for immunization: Secondary | ICD-10-CM

## 2014-12-08 DIAGNOSIS — Z3042 Encounter for surveillance of injectable contraceptive: Secondary | ICD-10-CM | POA: Diagnosis not present

## 2014-12-08 LAB — POCT PREGNANCY, URINE: Preg Test, Ur: NEGATIVE

## 2014-12-08 MED ORDER — MEDROXYPROGESTERONE ACETATE 150 MG/ML IM SUSP
150.0000 mg | INTRAMUSCULAR | Status: DC
  Administered 2014-12-08: 150 mg via INTRAMUSCULAR

## 2014-12-08 NOTE — Patient Instructions (Signed)
Return to clinic for any obstetric concerns or go to MAU for evaluation  

## 2014-12-08 NOTE — Progress Notes (Signed)
    GYNECOLOGY CLINIC PROCEDURE NOTE  [name] is a 24 y.o. 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 Desires Depo Provera.  Has history of CIN II after LEEP in 06/2014, has not received Garadasil.  No other gynecologic concerns.  Nexplanon Removal Patient identified, informed consent performed, consent signed.   Appropriate time out taken. Nexplanon site identified.  Area prepped in usual sterile fashon. One ml of 1% lidocaine was used to anesthetize the area at the distal end of the implant. A small stab incision was made right beside the implant on the distal portion.  The Nexplanon rod was grasped using hemostats and removed without difficulty.  There was minimal blood loss. There were no complications.  3 ml of 1% lidocaine was injected around the incision for post-procedure analgesia.  Steri-strips were applied over the small incision.  A pressure bandage was applied to reduce any bruising.  The patient tolerated the procedure well and was given post procedure instructions.    Depo Provera given today.  Counseled about HPV vaccine, will also start series today  Return for injections as scheduled; cotesting in 06/2015.   Jaynie Collins, MD, FACOG Attending Obstetrician & Gynecologist, [facility] and Center for [facility]

## 2015-02-08 ENCOUNTER — Ambulatory Visit: Payer: Medicaid Other

## 2015-02-11 ENCOUNTER — Emergency Department (HOSPITAL_COMMUNITY)
Admission: EM | Admit: 2015-02-11 | Discharge: 2015-02-11 | Disposition: A | Discharge status: Home / Self Care | Payer: Medicaid Other | Attending: Emergency Medicine | Admitting: Emergency Medicine

## 2015-02-11 ENCOUNTER — Encounter (HOSPITAL_COMMUNITY): Payer: Self-pay | Admitting: *Deleted

## 2015-02-11 DIAGNOSIS — Z8742 Personal history of other diseases of the female genital tract: Secondary | ICD-10-CM | POA: Insufficient documentation

## 2015-02-11 DIAGNOSIS — Z791 Long term (current) use of non-steroidal anti-inflammatories (NSAID): Secondary | ICD-10-CM | POA: Insufficient documentation

## 2015-02-11 DIAGNOSIS — B349 Viral infection, unspecified: Secondary | ICD-10-CM

## 2015-02-11 DIAGNOSIS — Z862 Personal history of diseases of the blood and blood-forming organs and certain disorders involving the immune mechanism: Secondary | ICD-10-CM | POA: Insufficient documentation

## 2015-02-11 DIAGNOSIS — Z792 Long term (current) use of antibiotics: Secondary | ICD-10-CM | POA: Insufficient documentation

## 2015-02-11 DIAGNOSIS — R011 Cardiac murmur, unspecified: Secondary | ICD-10-CM | POA: Insufficient documentation

## 2015-02-11 DIAGNOSIS — Z8739 Personal history of other diseases of the musculoskeletal system and connective tissue: Secondary | ICD-10-CM | POA: Insufficient documentation

## 2015-02-11 NOTE — ED Provider Notes (Signed)
CSN: [id number]     Arrival date & time 02/11/15  1350 History  By signing my name below, I, Elon Spanner, attest that this documentation has been prepared under the direction and in the presence of Samantha Dowless, PA-C. Electronically Signed: Elon Spanner ED Scribe. 02/11/2015. 2:42 PM.    No chief complaint on file.  The history is provided by the patient. No language interpreter was used.    HPI Comments: [name] is a 24 y.o. female with no chronic conditions who presents to the Emergency Department complaining of generalized body aches onset yesterday.  Associated symptoms include sore throat, headache (last night only, now resolved), nasal congestion, rhinorrhea, resolved right ear pain.  She denies sick contacts.  She has not received the flu vaccine this year.  Patient is tolerating food and fluids. Patient does not take any medications regularly.  She denies fever, trouble swallowing, SOB, cough, vomiting, diarrhea, abdominal pain, chest pain.   Past Medical History  Diagnosis Date  . Anemia   . Scoliosis   . Heart murmur   . CIN II (cervical intraepithelial neoplasia II)   . History of gestational hypertension    Past Surgical History  Procedure Laterality Date  . Leep  07/14/14    CIN II with negative margins   Family History  Problem Relation Age of Onset  . Anemia Mother   . Migraines Mother   . Mitral valve prolapse Sister   . Heart murmur Brother   . Heart attack Maternal Grandmother   . Mitral valve prolapse Maternal Grandmother   . Heart disease Maternal Grandmother   . Diabetes Maternal Grandmother   . Hyperlipidemia Maternal Grandmother   . Hypertension Maternal Grandmother    Social History  Substance Use Topics  . Smoking status: Never Smoker   . Smokeless tobacco: Never Used  . Alcohol Use: No   OB History    Gravida Para Term Preterm AB TAB SAB Ectopic Multiple Living   2 2 2  0 0 0 0 0 0 2     Review of Systems 10 Systems reviewed and all  are negative for acute change except as noted in the HPI.  Allergies  Review of patient's allergies indicates no known allergies.  Home Medications   Prior to Admission medications   Medication Sig Start Date End Date Taking? Authorizing Provider  Alum & Mag Hydroxide-Simeth (MAGIC MOUTHWASH W/LIDOCAINE) SOLN Take 5 mLs by mouth 4 (four) times daily as needed for mouth pain. Patient not taking: Reported on 12/08/2014 09/02/14   09/04/14, PA-C  fluticasone [facility]) 50 MCG/ACT nasal spray Place 2 sprays into both nostrils daily. Patient not taking: Reported on 12/08/2014 09/02/14   09/04/14, PA-C  naproxen (NAPROSYN) 500 MG tablet Take 1 tablet (500 mg total) by mouth 2 (two) times daily with a meal. 11/20/14   01/20/15, PA-C  penicillin v potassium (VEETID) 500 MG tablet Take 1 tablet (500 mg total) by mouth 4 (four) times daily. 11/20/14   01/20/15, PA-C   There were no vitals taken for this visit. Physical Exam  Constitutional: She is oriented to person, place, and time. She appears well-developed and well-nourished. No distress.  HENT:  Head: Normocephalic and atraumatic.  Mouth/Throat: Oropharynx is clear and moist. No oropharyngeal exudate.  Eyes: Conjunctivae and EOM are normal. Pupils are equal, round, and reactive to light. Right eye exhibits no discharge. Left eye exhibits no discharge. No scleral icterus.  Neck: Neck supple. No thyromegaly  present.  Cardiovascular: Normal rate, regular rhythm and normal heart sounds.   Pulmonary/Chest: Effort normal and breath sounds normal. No respiratory distress. She has no wheezes.  Abdominal: Soft.  Musculoskeletal: Normal range of motion. She exhibits no edema or tenderness.  Lymphadenopathy:    She has no cervical adenopathy.  Neurological: She is alert and oriented to person, place, and time. Coordination normal.  Strength 5/5 throughout. No sensory deficits.  No gait abnormality.  Skin: Skin is warm and dry. No rash  noted. She is not diaphoretic. No erythema. No pallor.  Psychiatric: She has a normal mood and affect. Her behavior is normal.  Nursing note and vitals reviewed.   ED Course  Procedures (including critical care time)  DIAGNOSTIC STUDIES: Oxygen Saturation is 100% on RA, normal by my interpretation.    COORDINATION OF CARE:  2:42 PM Discussed suspicion of viral etiology.  Patient should use ibuprofen.  Patient acknowledges and agrees with plan.    Labs Review Labs Reviewed - No data to display  Imaging Review No results found. I have personally reviewed and evaluated these images and lab results as part of my medical decision-making.   EKG Interpretation None      MDM   Final diagnoses:  Viral syndrome   Pt afebrile, non-toxic appearing. Throat non-erythematous. No centor criteria met. VSS. Patients symptoms are consistent with viral syndrome. Discussed that antibiotics are not indicated for viral infections. Pt will be discharged with symptomatic treatment. Encouraged pt to drink plenty of fluids. Take NSAIDS as needed for body aches. Pt Verbalizes understanding and is agreeable with plan. Pt is hemodynamically stable & in NAD prior to dc. Pt has PCP, may follow up if symptoms do not improve.    I personally performed the services described in this documentation, which was scribed in my presence. The recorded information has been reviewed and is accurate.     Dondra Spry [place], PA-C 02/11/15 [place], MD 02/11/15 1540

## 2015-02-11 NOTE — ED Notes (Signed)
AVS explained in detail. Knows to follow up with PCP if needed and to increase fluids and rest. Work note given. No other c/c. No SOB noted. RR even/unlabored.

## 2015-02-11 NOTE — Discharge Instructions (Signed)
Viral Infections A viral infection can be caused by different types of viruses.Most viral infections are not serious and resolve on their own. However, some infections may cause severe symptoms and may lead to further complications. SYMPTOMS Viruses can frequently cause:  Minor sore throat.  Aches and pains.  Headaches.  Runny nose.  Different types of rashes.  Watery eyes.  Tiredness.  Cough.  Loss of appetite.  Gastrointestinal infections, resulting in nausea, vomiting, and diarrhea. These symptoms do not respond to antibiotics because the infection is not caused by bacteria. However, you might catch a bacterial infection following the viral infection. This is sometimes called a "superinfection." Symptoms of such a bacterial infection may include:  Worsening sore throat with pus and difficulty swallowing.  Swollen neck glands.  Chills and a high or persistent fever.  Severe headache.  Tenderness over the sinuses.  Persistent overall ill feeling (malaise), muscle aches, and tiredness (fatigue).  Persistent cough.  Yellow, green, or brown mucus production with coughing. HOME CARE INSTRUCTIONS   Only take over-the-counter or prescription medicines for pain, discomfort, diarrhea, or fever as directed by your caregiver.  Drink enough water and fluids to keep your urine clear or pale yellow. Sports drinks can provide valuable electrolytes, sugars, and hydration.  Get plenty of rest and maintain proper nutrition. Soups and broths with crackers or rice are fine. SEEK IMMEDIATE MEDICAL CARE IF:   You have severe headaches, shortness of breath, chest pain, neck pain, or an unusual rash.  You have uncontrolled vomiting, diarrhea, or you are unable to keep down fluids.  You or your child has an oral temperature above 102 F (38.9 C), not controlled by medicine.  Your baby is older than 3 months with a rectal temperature of 102 F (38.9 C) or higher.  Your baby is 733  months old or younger with a rectal temperature of 100.4 F (38 C) or higher. MAKE SURE YOU:   Understand these instructions.  Will watch your condition.  Will get help right away if you are not doing well or get worse.   This information is not intended to replace advice given to you by your health care provider. Make sure you discuss any questions you have with your health care provider.  Follow up with your PCP if symptoms do not improve. Encourage drinking plenty of fluids. Stay hydrated. Get rest. Take ibuprofen as needed for body aches. Return to teh Emergency Department if you experience fever, chills, vomiting, diarrhea, difficulty breathing or swallowing.

## 2015-02-11 NOTE — ED Notes (Signed)
Pt reports generalized body aches, headache and rhinorrhea starting last night.

## 2015-02-23 ENCOUNTER — Ambulatory Visit: Payer: Medicaid Other

## 2015-03-19 NOTE — L&D Delivery Note (Signed)
Delivery Note At 8:30 PM a viable female was delivered via Vaginal, Spontaneous Delivery (Presentation:OA ;  ).  APGAR: 8, 9; weight pending .   Placenta status: , intact.  Cord: 3 vessel  with the following complications:1 true knot .  Cord pH: not done  Anesthesia:  epidural Episiotomy: None Lacerations:  none Suture Repair: n/a Est. Blood Loss (mL):  250  Mom to postpartum.  Baby to Couplet care / Skin to Skin.  Sandro Burgo H 02/09/2016, 8:57 PM

## 2015-03-20 ENCOUNTER — Emergency Department (INDEPENDENT_AMBULATORY_CARE_PROVIDER_SITE_OTHER)
Admission: EM | Admit: 2015-03-20 | Discharge: 2015-03-20 | Disposition: A | Discharge status: Home / Self Care | Payer: Self-pay | Source: Home / Self Care | Attending: Family Medicine | Admitting: Family Medicine

## 2015-03-20 ENCOUNTER — Emergency Department (HOSPITAL_COMMUNITY)
Admission: EM | Admit: 2015-03-20 | Discharge: 2015-03-20 | Disposition: A | Discharge status: Home / Self Care | Payer: Medicaid Other

## 2015-03-20 ENCOUNTER — Encounter (HOSPITAL_COMMUNITY): Payer: Self-pay | Admitting: *Deleted

## 2015-03-20 DIAGNOSIS — B349 Viral infection, unspecified: Secondary | ICD-10-CM

## 2015-03-20 HISTORY — DX: Essential (primary) hypertension: I10

## 2015-03-20 NOTE — ED Notes (Addendum)
Pt  Reports   Symptoms  Of  Fever   Body  Aches           Nasal  Congestion  /   Cough      Onset  Of   Symptoms  X  1  Day          child  Was  Sick  Last  Week  But is  Better  Now  Pt  Reports has  A  History  Of  htn     States  She takes med  For  bp  But  Does  Not  Know  The name

## 2015-03-20 NOTE — ED Provider Notes (Signed)
CSN: [id number]     Arrival date & time 03/20/15  1319 History   First MD Initiated Contact with Patient 03/20/15 1507     Chief Complaint  Patient presents with  . Fever   (Consider location/radiation/quality/duration/timing/severity/associated sxs/prior Treatment) HPI Comments: 25 year old female complaining of body aches, cough, clearing her throat frequently but denying PND and equivocal sore throat. Denies fever. She states her son was hospitalized with a viral condition recently. Denies dyspnea.  Patient is a 25 y.o. female presenting with fever.  Fever Associated symptoms: congestion, cough, ear pain and myalgias   Associated symptoms: no chills and no rash     Past Medical History  Diagnosis Date  . Anemia   . Scoliosis   . Heart murmur   . CIN II (cervical intraepithelial neoplasia II)   . History of gestational hypertension   . Hypertension    Past Surgical History  Procedure Laterality Date  . Leep  07/14/14    CIN II with negative margins   Family History  Problem Relation Age of Onset  . Anemia Mother   . Migraines Mother   . Mitral valve prolapse Sister   . Heart murmur Brother   . Heart attack Maternal Grandmother   . Mitral valve prolapse Maternal Grandmother   . Heart disease Maternal Grandmother   . Diabetes Maternal Grandmother   . Hyperlipidemia Maternal Grandmother   . Hypertension Maternal Grandmother    Social History  Substance Use Topics  . Smoking status: Never Smoker   . Smokeless tobacco: Never Used  . Alcohol Use: No   OB History    Gravida Para Term Preterm AB TAB SAB Ectopic Multiple Living   2 2 2  0 0 0 0 0 0 2     Review of Systems  Constitutional: Positive for fever and activity change. Negative for chills.  HENT: Positive for congestion and ear pain. Negative for postnasal drip.   Respiratory: Positive for cough. Negative for shortness of breath.   Gastrointestinal: Negative.   Musculoskeletal: Positive for myalgias.  Skin:  Negative for rash.  Neurological: Negative.     Allergies  Review of patient's allergies indicates no known allergies.  Home Medications   Prior to Admission medications   Medication Sig Start Date End Date Taking? Authorizing Provider  Alum & Mag Hydroxide-Simeth (MAGIC MOUTHWASH W/LIDOCAINE) SOLN Take 5 mLs by mouth 4 (four) times daily as needed for mouth pain. Patient not taking: Reported on 12/08/2014 09/02/14   Antony Madura, PA-C  fluticasone [facility]) 50 MCG/ACT nasal spray Place 2 sprays into both nostrils daily. Patient not taking: Reported on 12/08/2014 09/02/14   Antony Madura, PA-C  naproxen (NAPROSYN) 500 MG tablet Take 1 tablet (500 mg total) by mouth 2 (two) times daily with a meal. Patient not taking: Reported on 02/11/2015 11/20/14   Garlon Hatchet, PA-C  penicillin v potassium (VEETID) 500 MG tablet Take 1 tablet (500 mg total) by mouth 4 (four) times daily. Patient not taking: Reported on 02/11/2015 11/20/14   Garlon Hatchet, PA-C   Meds Ordered and Administered this Visit  Medications - No data to display  BP 152/104 mmHg  Pulse 102  Temp(Src) 100.3 F (37.9 C) (Oral)  Resp 18  SpO2 100% No data found.   Physical Exam  Constitutional: She appears well-developed and well-nourished. No distress.  Alert, awake, aware, talkative, energetic movement and speech. Nontoxic in appearance.  HENT:  Head: Normocephalic and atraumatic.  Bilateral TMs are retracted. No effusion. Made  several attempts to visualize the oropharynx as well as obtain a swab sample, however the patient will not allow me to do this due to untenable retraction of the tongue, forcing her head back and patient and attempting to push my arm away. Unable to visualize the oropharynx.  Neck: Normal range of motion. Neck supple.  Cardiovascular: Normal rate, normal heart sounds and intact distal pulses.   Pulmonary/Chest: Effort normal and breath sounds normal. No respiratory distress. She has no wheezes. She  has no rales.  Lymphadenopathy:    She has no cervical adenopathy.  Neurological: She is alert. No cranial nerve deficit. She exhibits normal muscle tone.  Skin: Skin is warm and dry.  Nursing note and vitals reviewed.   ED Course  Procedures (including critical care time)  Labs Review Labs Reviewed - No data to display  Imaging Review No results found.   Visual Acuity Review  Right Eye Distance:   Left Eye Distance:   Bilateral Distance:    Right Eye Near:   Left Eye Near:    Bilateral Near:         MDM   1. Acute viral syndrome    Viral Infections He likely have a viral illness. Since you are clearing her throat frequently it is likely that your throat discomfort is due to drainage. Recommend taking Allegra or Zyrtec for drainage. Ibuprofen 600 mg every 6-8 hours as needed for aches and pains and sore throat Drink plenty of fluids stay well-hydrated Cepacol lozenges for your throat Follow-up your primary care doctor later this week.   Hayden Rasmussenavid Yovani Cogburn, NP 03/20/15 [phone]

## 2015-03-20 NOTE — Discharge Instructions (Signed)
Viral Infections He likely have a viral illness. Since you are clearing her throat frequently it is likely that your throat discomfort is due to drainage. Recommend taking Allegra or Zyrtec for drainage. Ibuprofen 600 mg every 6-8 hours as needed for aches and pains and sore throat Drink plenty of fluids stay well-hydrated Cepacol lozenges for your throat Follow-up your primary care doctor later this week. A virus is a type of germ. Viruses can cause:  Minor sore throats.  Aches and pains.  Headaches.  Runny nose.  Rashes.  Watery eyes.  Tiredness.  Coughs.  Loss of appetite.  Feeling sick to your stomach (nausea).  Throwing up (vomiting).  Watery poop (diarrhea). HOME CARE   Only take medicines as told by your doctor.  Drink enough water and fluids to keep your pee (urine) clear or pale yellow. Sports drinks are a good choice.  Get plenty of rest and eat healthy. Soups and broths with crackers or rice are fine. GET HELP RIGHT AWAY IF:   You have a very bad headache.  You have shortness of breath.  You have chest pain or neck pain.  You have an unusual rash.  You cannot stop throwing up.  You have watery poop that does not stop.  You cannot keep fluids down.  You or your child has a temperature by mouth above 102 F (38.9 C), not controlled by medicine.  Your baby is older than 3 months with a rectal temperature of 102 F (38.9 C) or higher.  Your baby is 733 months old or younger with a rectal temperature of 100.4 F (38 C) or higher. MAKE SURE YOU:   Understand these instructions.  Will watch this condition.  Will get help right away if you are not doing well or get worse.   This information is not intended to replace advice given to you by your health care provider. Make sure you discuss any questions you have with your health care provider.   Document Released: 02/15/2008 Document Revised: 05/27/2011 Document Reviewed: 08/10/2014 Elsevier  Interactive Patient Education [employer]2016 Elsevier Inc.

## 2015-05-28 ENCOUNTER — Emergency Department (HOSPITAL_COMMUNITY)
Admission: EM | Admit: 2015-05-28 | Discharge: 2015-05-28 | Disposition: A | Discharge status: Home / Self Care | Payer: Self-pay | Attending: Emergency Medicine | Admitting: Emergency Medicine

## 2015-05-28 ENCOUNTER — Emergency Department (HOSPITAL_COMMUNITY): Payer: Self-pay

## 2015-05-28 DIAGNOSIS — I10 Essential (primary) hypertension: Secondary | ICD-10-CM | POA: Insufficient documentation

## 2015-05-28 DIAGNOSIS — Z3202 Encounter for pregnancy test, result negative: Secondary | ICD-10-CM | POA: Insufficient documentation

## 2015-05-28 DIAGNOSIS — Z862 Personal history of diseases of the blood and blood-forming organs and certain disorders involving the immune mechanism: Secondary | ICD-10-CM | POA: Insufficient documentation

## 2015-05-28 DIAGNOSIS — M419 Scoliosis, unspecified: Secondary | ICD-10-CM | POA: Insufficient documentation

## 2015-05-28 DIAGNOSIS — R002 Palpitations: Secondary | ICD-10-CM | POA: Insufficient documentation

## 2015-05-28 LAB — I-STAT BETA HCG BLOOD, ED (MC, WL, AP ONLY): I-stat hCG, quantitative: <5 m[IU]/mL (ref <5)

## 2015-05-28 LAB — POC URINE PREG, ED: Preg Test, Ur: NEGATIVE

## 2015-05-28 LAB — BASIC METABOLIC PANEL
Anion gap: 13 (ref 5–15)
BUN: 6 mg/dL (ref 6–20)
CO2: 26 mmol/L (ref 22–32)
Calcium: 9.5 mg/dL (ref 8.9–10.3)
Chloride: 101 mmol/L (ref 101–111)
Creatinine, Ser: 0.68 mg/dL (ref 0.44–1.00)
GFR calc Af Amer: >60 mL/min (ref >60)
GFR calc non Af Amer: >60 mL/min (ref >60)
Glucose, Bld: 102 mg/dL — ABNORMAL HIGH (ref 65–99)
Potassium: 4 mmol/L (ref 3.5–5.1)
Sodium: 140 mmol/L (ref 135–145)

## 2015-05-28 LAB — CBC
HCT: 38.1 % (ref 36.0–46.0)
Hemoglobin: 11.9 g/dL — ABNORMAL LOW (ref 12.0–15.0)
MCH: 25.6 pg — ABNORMAL LOW (ref 26.0–34.0)
MCHC: 31.2 g/dL (ref 30.0–36.0)
MCV: 82.1 fL (ref 78.0–100.0)
Platelets: 241 10*3/uL (ref 150–400)
RBC: 4.64 MIL/uL (ref 3.87–5.11)
RDW: 14.5 % (ref 11.5–15.5)
WBC: 4.8 10*3/uL (ref 4.0–10.5)

## 2015-05-28 LAB — URINALYSIS, ROUTINE W REFLEX MICROSCOPIC
Bilirubin Urine: NEGATIVE
Glucose, UA: NEGATIVE mg/dL
Ketones, ur: NEGATIVE mg/dL
Nitrite: NEGATIVE
Protein, ur: NEGATIVE mg/dL
Specific Gravity, Urine: 1.021 (ref 1.005–1.030)
pH: 6 (ref 5.0–8.0)

## 2015-05-28 LAB — URINE MICROSCOPIC-ADD ON: RBC / HPF: NONE SEEN RBC/hpf (ref 0–5)

## 2015-05-28 NOTE — ED Provider Notes (Signed)
CSN: [id number]     Arrival date & time 05/28/15  0930 History   First MD Initiated Contact with Patient 05/28/15 [phone]     Chief Complaint  Patient presents with  . Irregular Heart Beat   (Consider location/radiation/quality/duration/timing/severity/associated sxs/prior Treatment) The history is provided by the patient. No language interpreter was used.     [name] is a 25 y.o female with a history of hypertension and heart murmur who presents with an intermittent fast pounding heartbeat for the past couple of days. She reports waking up with this yesterday and today. She also reports that it resolves after 1-2 minutes and drinking water. She has a history of anemia. She also reports that she has not had her menstrual cycle in 2 months but that she does not believe she is pregnant. She is not on birth control.  She denies any recent illness, fever, shortness of breath, chest pain, abdominal pain, nausea, vomiting, diarrhea.   Past Medical History  Diagnosis Date  . Anemia   . Scoliosis   . Heart murmur   . CIN II (cervical intraepithelial neoplasia II)   . History of gestational hypertension   . Hypertension    Past Surgical History  Procedure Laterality Date  . Leep  07/14/14    CIN II with negative margins   Family History  Problem Relation Age of Onset  . Anemia Mother   . Migraines Mother   . Mitral valve prolapse Sister   . Heart murmur Brother   . Heart attack Maternal Grandmother   . Mitral valve prolapse Maternal Grandmother   . Heart disease Maternal Grandmother   . Diabetes Maternal Grandmother   . Hyperlipidemia Maternal Grandmother   . Hypertension Maternal Grandmother    Social History  Substance Use Topics  . Smoking status: Never Smoker   . Smokeless tobacco: Never Used  . Alcohol Use: No   OB History    Gravida Para Term Preterm AB TAB SAB Ectopic Multiple Living   2 2 2  0 0 0 0 0 0 2     Review of Systems  Constitutional: Negative for fever.   Respiratory: Negative for shortness of breath.   Cardiovascular: Positive for palpitations. Negative for chest pain.  All other systems reviewed and are negative.     Allergies  Review of patient's allergies indicates no known allergies.  Home Medications   Prior to Admission medications   Medication Sig Start Date End Date Taking? Authorizing Provider  Alum & Mag Hydroxide-Simeth (MAGIC MOUTHWASH W/LIDOCAINE) SOLN Take 5 mLs by mouth 4 (four) times daily as needed for mouth pain. Patient not taking: Reported on 12/08/2014 09/02/14   Antony MaduraKelly Humes, PA-C  fluticasone [facility](FLONASE) 50 MCG/ACT nasal spray Place 2 sprays into both nostrils daily. Patient not taking: Reported on 12/08/2014 09/02/14   Antony MaduraKelly Humes, PA-C  naproxen (NAPROSYN) 500 MG tablet Take 1 tablet (500 mg total) by mouth 2 (two) times daily with a meal. Patient not taking: Reported on 02/11/2015 11/20/14   Garlon HatchetLisa M Sanders, PA-C  penicillin v potassium (VEETID) 500 MG tablet Take 1 tablet (500 mg total) by mouth 4 (four) times daily. Patient not taking: Reported on 02/11/2015 11/20/14   Garlon HatchetLisa M Sanders, PA-C   BP 115/77 mmHg  Pulse 71  Temp(Src) 98.4 F (36.9 C) (Oral)  Resp 18  Ht 5\' 7"  (1.702 m)  Wt 71.3 kg  BMI 24.61 kg/m2  SpO2 100%  LMP 04/28/2015 Physical Exam  Constitutional: She is oriented to  person, place, and time. She appears well-developed and well-nourished.  HENT:  Head: Normocephalic and atraumatic.  Eyes: Conjunctivae are normal.  Neck: Normal range of motion. Neck supple.  Cardiovascular: Normal rate, regular rhythm and normal heart sounds.   No murmur heard. Regular rate and rhythm. No murmurs, rubs, or gallops. Appears comfortable. No tachycardia.   Pulmonary/Chest: Effort normal and breath sounds normal. No respiratory distress. She has no wheezes. She has no rales.  Lungs clear to auscultation bilaterally. No decreased breath sounds or wheezing.   Abdominal: Soft. There is no tenderness.   Musculoskeletal: Normal range of motion.  No lower extremity swelling.  Ambulatory without SOB.  Neurological: She is alert and oriented to person, place, and time.  Skin: Skin is warm and dry.  No pallor.   Nursing note and vitals reviewed.   ED Course  Procedures (including critical care time) Labs Review Labs Reviewed  BASIC METABOLIC PANEL - Abnormal; Notable for the following:    Glucose, Bld 102 (*)    All other components within normal limits  CBC - Abnormal; Notable for the following:    Hemoglobin 11.9 (*)    MCH 25.6 (*)    All other components within normal limits  URINALYSIS, ROUTINE W REFLEX MICROSCOPIC (NOT AT [facility]) - Abnormal; Notable for the following:    APPearance CLOUDY (*)    Hgb urine dipstick LARGE (*)    Leukocytes, UA TRACE (*)    All other components within normal limits  URINE MICROSCOPIC-ADD ON - Abnormal; Notable for the following:    Squamous Epithelial / LPF TOO NUMEROUS TO COUNT (*)    Bacteria, UA RARE (*)    All other components within normal limits  URINE CULTURE  I-STAT BETA HCG BLOOD, ED ([facility], [facility], [facility] ONLY)  POC URINE PREG, ED    Imaging Review Dg Chest 2 View  05/28/2015  CLINICAL DATA:  Irregular heart beat. EXAM: CHEST  2 VIEW COMPARISON:  None. FINDINGS: Heart and mediastinal contours are within normal limits. No focal opacities or effusions. No acute bony abnormality. S-shaped thoracolumbar scoliosis. IMPRESSION: No active cardiopulmonary disease. Electronically Signed   By: Charlett Nose M.D.   On: 05/28/2015 10:30   I have personally reviewed and evaluated these images and lab results as part of my medical decision-making.   EKG Interpretation None      MDM   Final diagnoses:  Palpitations   Patient presents with 2 episodes of palpitations that resolved after 1-2 minutes and drinking water. Vitals stable and she is well appearing. Labs are not concerning. Patient is mildly anemic and is aware of this. No UTI. She is not  pregnant. Chest x-ray is negative for pneumonia or edema.  She has atrial enlargement on EKG. Her symptoms have resolved. I discussed follow-up with cardiology. Return precautions were also discussed. Patient agrees with plan.   Catha Gosselin, PA-C 05/28/15 1454  Loren Racer, MD 05/29/15 [phone]

## 2015-05-28 NOTE — ED Notes (Signed)
Urine culture sent down to main lab as add on  

## 2015-05-28 NOTE — ED Notes (Signed)
Pt c/o irregular heart beat noted the other day. Pt reports that her chest was beating really fast when she woke up. Pt has not had a menstrual period for 2 months.

## 2015-05-28 NOTE — Discharge Instructions (Signed)
Palpitations Follow up with cardiology. Return for lightheadedness, chest pain, increased shortness of breath or dizziness.  A palpitation is the feeling that your heartbeat is irregular or is faster than normal. It may feel like your heart is fluttering or skipping a beat. Palpitations are usually not a serious problem. However, in some cases, you may need further medical evaluation. CAUSES  Palpitations can be caused by:  Smoking.  Caffeine or other stimulants, such as diet pills or energy drinks.  Alcohol.  Stress and anxiety.  Strenuous physical activity.  Fatigue.  Certain medicines.  Heart disease, especially if you have a history of irregular heart rhythms (arrhythmias), such as atrial fibrillation, atrial flutter, or supraventricular tachycardia.  An improperly working pacemaker or defibrillator. DIAGNOSIS  To find the cause of your palpitations, your health care provider will take your medical history and perform a physical exam. Your health care provider may also have you take a test called an ambulatory electrocardiogram (ECG). An ECG records your heartbeat patterns over a 24-hour period. You may also have other tests, such as:  Transthoracic echocardiogram (TTE). During echocardiography, sound waves are used to evaluate how blood flows through your heart.  Transesophageal echocardiogram (TEE).  Cardiac monitoring. This allows your health care provider to monitor your heart rate and rhythm in real time.  Holter monitor. This is a portable device that records your heartbeat and can help diagnose heart arrhythmias. It allows your health care provider to track your heart activity for several days, if needed.  Stress tests by exercise or by giving medicine that makes the heart beat faster. TREATMENT  Treatment of palpitations depends on the cause of your symptoms and can vary greatly. Most cases of palpitations do not require any treatment other than time, relaxation, and  monitoring your symptoms. Other causes, such as atrial fibrillation, atrial flutter, or supraventricular tachycardia, usually require further treatment. HOME CARE INSTRUCTIONS   Avoid:  Caffeinated coffee, tea, soft drinks, diet pills, and energy drinks.  Chocolate.  Alcohol.  Stop smoking if you smoke.  Reduce your stress and anxiety. Things that can help you relax include:  A method of controlling things in your body, such as your heartbeats, with your mind (biofeedback).  Yoga.  Meditation.  Physical activity such as swimming, jogging, or walking.  Get plenty of rest and sleep. SEEK MEDICAL CARE IF:   You continue to have a fast or irregular heartbeat beyond 24 hours.  Your palpitations occur more often. SEEK IMMEDIATE MEDICAL CARE IF:  You have chest pain or shortness of breath.  You have a severe headache.  You feel dizzy or you faint. MAKE SURE YOU:  Understand these instructions.  Will watch your condition.  Will get help right away if you are not doing well or get worse.   This information is not intended to replace advice given to you by your health care provider. Make sure you discuss any questions you have with your health care provider.   Document Released: 03/01/2000 Document Revised: 03/09/2013 Document Reviewed: 05/03/2011 Elsevier Interactive Patient Education [employer] Else[employer] Inc[employer]

## 2015-05-30 LAB — URINE CULTURE: Special Requests: NORMAL

## 2015-06-02 ENCOUNTER — Ambulatory Visit (INDEPENDENT_AMBULATORY_CARE_PROVIDER_SITE_OTHER): Payer: Self-pay | Admitting: Nurse Practitioner

## 2015-06-02 ENCOUNTER — Encounter: Payer: Self-pay | Admitting: Nurse Practitioner

## 2015-06-02 VITALS — BP 130/88 | HR 64 | Ht 66.0 in | Wt 158.8 lb

## 2015-06-02 DIAGNOSIS — R002 Palpitations: Secondary | ICD-10-CM

## 2015-06-02 NOTE — Patient Instructions (Addendum)
We will be checking the following labs today - NONE   Medication Instructions:    Continue with your current medicines.  Call us with the name of your HTN medicine.      Testing/Procedures To Be Arranged:  Echocardiogram  Event monitor  Follow-Up:   Will see what your studies show and then decide about follow up.     Other Special Instructions:   Keep restricting your caffeine.     If you need a refill on your cardiac medications before your next appointment, please call your pharmacy.   [facility] office at [phone] if you have any questions, problems or concerns.

## 2015-06-02 NOTE — Progress Notes (Addendum)
CARDIOLOGY OFFICE NOTE  Date:  06/02/2015    [name] Date of Birth: 06-27-1990 Medical Record #[id number]  PCP:  Shirlee Latch, MD  Cardiologist:  Graciela Husbands [facility])    Chief Complaint  Patient presents with  . Palpitations    New patient visit - seen for Dr. Graciela Husbands    History of Present Illness: [name] is a 25 y.o. female who presents today for a new patient visit. Seen for Dr. Graciela Husbands (DOD).   She has no past cardiac history other than HTN.   Presented earlier this week to the ER with palpitations. Noted a fast pounding heartbeat. Had had 2 episodes and they resolved after 1 to 2 minutes and after drinking water. Evaluation was unremarkable and she was asked to follow up here.   Comes in today. Here alone. Says she has been short of breath for at least a month. Not getting worse but not getting better. She continues to note heart racing - this will make her dizzy - does not happen every day. She does not exercise regularly. She has gained weight. No real chest pain. No caffeine use. Does not smoke or use drugs. She has been hypertensive since her last pregnancy back in 2013 - she is on medicine but she does not know the name of her medicine.  She says she was told to come here due to "having 2 enlargements on my heart".   Past Medical History  Diagnosis Date  . Anemia   . Scoliosis   . Heart murmur   . CIN II (cervical intraepithelial neoplasia II)   . History of gestational hypertension   . Hypertension     Past Surgical History  Procedure Laterality Date  . Leep  07/14/14    CIN II with negative margins     Medications: Current Outpatient Prescriptions  Medication Sig Dispense Refill  . Alum & Mag Hydroxide-Simeth (MAGIC MOUTHWASH W/LIDOCAINE) SOLN Take 5 mLs by mouth 4 (four) times daily as needed for mouth pain. 120 mL 0  . fluticasone (FLONASE) 50 MCG/ACT nasal spray Place 2 sprays into both nostrils daily. 16 g 2  . naproxen (NAPROSYN) 500 MG tablet  Take 1 tablet (500 mg total) by mouth 2 (two) times daily with a meal. 30 tablet 0  . penicillin v potassium (VEETID) 500 MG tablet Take 1 tablet (500 mg total) by mouth 4 (four) times daily. 40 tablet 0   Current Facility-Administered Medications  Medication Dose Route Frequency Provider Last Rate Last Dose  . medroxyPROGESTERone (DEPO-PROVERA) injection 150 mg  150 mg Intramuscular Q90 days Tereso Newcomer, MD   150 mg at 12/08/14 1211    Allergies: No Known Allergies  Social History: The patient  reports that she has never smoked. She has never used smokeless tobacco. She reports that she does not drink alcohol or use illicit drugs.   Family History: The patient's family history includes Anemia in her mother; Diabetes in her maternal grandmother; Heart attack in her maternal grandmother; Heart disease in her maternal grandmother; Heart murmur in her brother; Hyperlipidemia in her maternal grandmother; Hypertension in her maternal grandmother; Migraines in her mother; Mitral valve prolapse in her maternal grandmother and sister.   Review of Systems: Please see the history of present illness.   Otherwise, the review of systems is positive for none.   All other systems are reviewed and negative.   Physical Exam: VS:  BP 130/88 mmHg  Pulse 64  Ht  (  1.676 m)  Wt 158 lb 12.8 oz (72.031 kg)  BMI 25.64 kg/m2  LMP 04/28/2015 .  BMI Body mass index is 25.64 kg/(m^2).  Wt Readings from Last 3 Encounters:  06/02/15 158 lb 12.8 oz (72.031 kg)  05/28/15 157 lb 3 oz (71.3 kg)  12/08/14 143 lb (64.864 kg)    General: Pleasant. She is overweight. She is alert and in no acute distress.  HEENT: Normal. Neck: Supple, no JVD, carotid bruits, or masses noted.  Cardiac: Regular rate and rhythm. No murmurs, rubs, or gallops that I appreciate.  No edema.  Respiratory:  Lungs are clear to auscultation bilaterally with normal work of breathing. She has scoliosis.  GI: Soft and nontender.  MS:  No deformity or atrophy. Gait and ROM intact. Skin: Warm and dry. Color is normal.  Neuro:  Strength and sensation are intact and no gross focal deficits noted.  Psych: Alert, appropriate and with normal affect.   LABORATORY DATA:  EKG:  EKG is ordered today. This shows NSR with LAE - reviewed with Dr. Graciela HusbandsKlein.   Lab Results  Component Value Date   WBC 4.8 05/28/2015   HGB 11.9* 05/28/2015   HCT 38.1 05/28/2015   PLT 241 05/28/2015   GLUCOSE 102* 05/28/2015   ALT 12 08/05/2012   AST 23 08/05/2012   NA 140 05/28/2015   K 4.0 05/28/2015   CL 101 05/28/2015   CREATININE 0.68 05/28/2015   BUN 6 05/28/2015   CO2 26 05/28/2015    BNP (last 3 results) No results for input(s): BNP in the last 8760 hours.  ProBNP (last 3 results) No results for input(s): PROBNP in the last 8760 hours.   Other Studies Reviewed Today:  CXRFINDINGS: Heart and mediastinal contours are within normal limits. No focal opacities or effusions. No acute bony abnormality. S-shaped thoracolumbar scoliosis.  IMPRESSION: No active cardiopulmonary disease.   Electronically Signed  By: Charlett NoseKevin Dover M.D.  On: 05/28/2015 10:30  Assessment/Plan: 1. Dyspnea and Palpitations - does have some LAE on EKG today. I do not appreciate a murmur. Discussed with Dr. Graciela HusbandsKlein (DOD) - will arrange for event monitor and echocardiogram. She has already stopped caffeine. Does not exercise.   2. HTN - BP ok. She will call us with her current regimen. Orthostatics to be checked today.   This shows lying BP of 130/85 with HR 67 Sitting BP 127/82 with HR 71 Standing 130/84 with HR 75 Standing 3 minutes 124/85 with HR 84 Standing 5 minutes 125/86 with HR 82  Current medicines are reviewed with the patient today.  The patient does not have concerns regarding medicines other than what has been noted above.  The following changes have been made:  See above.  Labs/ tests ordered today include:    Orders Placed This  Encounter  Procedures  . Cardiac event monitor  . EKG 12-Lead  . ECHOCARDIOGRAM COMPLETE     Disposition:   Further disposition pending outcome of her studies.    Patient is agreeable to this plan and will call if any problems develop in the interim.   Signed: Rosalio MacadamiaLori C. Lakita Sahlin, RN, ANP-C 06/02/2015 8:40 AM  [facility] [address] [place], [place]  [address] Phone: [phone] Fax: [phone]        Phone call from today 06/06/2015 She is on chlorthalidone for her antihypertensive medicine.   Rosalio MacadamiaLori C. Miangel Flom, RN, ANP-C [name] [address]  [place], [place]  [address] [phone]

## 2015-06-06 ENCOUNTER — Telehealth: Payer: Self-pay | Admitting: Nurse Practitioner

## 2015-06-06 NOTE — Telephone Encounter (Signed)
**Note De-Identified Vasti Yagi Obfuscation** This message has been verbally given to Norma FredricksonLori Gerhardt, NP.

## 2015-06-06 NOTE — Telephone Encounter (Signed)
06-06-15 Received application from patient to enrolled in [employer] hardship program.   [place].   Once this is done I will fax to [facility] @ [employer].

## 2015-06-06 NOTE — Telephone Encounter (Signed)
The name of the HNT medicine I'm on is Chlorthalidone.

## 2015-06-07 ENCOUNTER — Ambulatory Visit (INDEPENDENT_AMBULATORY_CARE_PROVIDER_SITE_OTHER): Payer: Self-pay

## 2015-06-07 VITALS — BP 128/89 | HR 82 | Wt 156.6 lb

## 2015-06-07 DIAGNOSIS — Z23 Encounter for immunization: Secondary | ICD-10-CM

## 2015-06-08 ENCOUNTER — Encounter: Payer: Self-pay | Admitting: *Deleted

## 2015-06-08 NOTE — Progress Notes (Signed)
Patient ID: [name], female   DOB: 05/31/1990, 25 y.o.   MRN: [id number] Patient enrolled for a [employer] cardiac event monitor pending approval of the [employer] hardship program.  She does not have a land line phone.  If approved, the patient will be scheduled to have her monitor applied in our office.  [employer] will need to be notified of monitor serial number prior to application.

## 2015-06-16 ENCOUNTER — Other Ambulatory Visit: Payer: Self-pay

## 2015-06-16 ENCOUNTER — Ambulatory Visit (HOSPITAL_COMMUNITY): Payer: Self-pay | Attending: Cardiology

## 2015-06-16 DIAGNOSIS — I371 Nonrheumatic pulmonary valve insufficiency: Secondary | ICD-10-CM | POA: Insufficient documentation

## 2015-06-16 DIAGNOSIS — I10 Essential (primary) hypertension: Secondary | ICD-10-CM | POA: Insufficient documentation

## 2015-06-16 DIAGNOSIS — R002 Palpitations: Secondary | ICD-10-CM | POA: Insufficient documentation

## 2015-06-16 DIAGNOSIS — I340 Nonrheumatic mitral (valve) insufficiency: Secondary | ICD-10-CM | POA: Insufficient documentation

## 2015-06-19 ENCOUNTER — Telehealth: Payer: Self-pay | Admitting: Nurse Practitioner

## 2015-06-19 NOTE — Telephone Encounter (Signed)
Pt returning Danielle's call regarding results-pls call

## 2015-06-22 ENCOUNTER — Ambulatory Visit (INDEPENDENT_AMBULATORY_CARE_PROVIDER_SITE_OTHER): Payer: Self-pay

## 2015-06-22 DIAGNOSIS — R002 Palpitations: Secondary | ICD-10-CM

## 2015-07-20 ENCOUNTER — Encounter (HOSPITAL_COMMUNITY): Payer: Self-pay | Admitting: Emergency Medicine

## 2015-07-20 ENCOUNTER — Emergency Department (HOSPITAL_COMMUNITY)
Admission: EM | Admit: 2015-07-20 | Discharge: 2015-07-21 | Disposition: A | Discharge status: Home / Self Care | Payer: Medicaid Other | Attending: Emergency Medicine | Admitting: Emergency Medicine

## 2015-07-20 DIAGNOSIS — Z7951 Long term (current) use of inhaled steroids: Secondary | ICD-10-CM | POA: Diagnosis not present

## 2015-07-20 DIAGNOSIS — M419 Scoliosis, unspecified: Secondary | ICD-10-CM | POA: Diagnosis not present

## 2015-07-20 DIAGNOSIS — B9689 Other specified bacterial agents as the cause of diseases classified elsewhere: Secondary | ICD-10-CM

## 2015-07-20 DIAGNOSIS — N390 Urinary tract infection, site not specified: Secondary | ICD-10-CM

## 2015-07-20 DIAGNOSIS — Z792 Long term (current) use of antibiotics: Secondary | ICD-10-CM | POA: Diagnosis not present

## 2015-07-20 DIAGNOSIS — Z3A08 8 weeks gestation of pregnancy: Secondary | ICD-10-CM | POA: Insufficient documentation

## 2015-07-20 DIAGNOSIS — R1030 Lower abdominal pain, unspecified: Secondary | ICD-10-CM

## 2015-07-20 DIAGNOSIS — Z3490 Encounter for supervision of normal pregnancy, unspecified, unspecified trimester: Secondary | ICD-10-CM

## 2015-07-20 DIAGNOSIS — Z791 Long term (current) use of non-steroidal anti-inflammatories (NSAID): Secondary | ICD-10-CM | POA: Insufficient documentation

## 2015-07-20 DIAGNOSIS — O10911 Unspecified pre-existing hypertension complicating pregnancy, first trimester: Secondary | ICD-10-CM | POA: Diagnosis not present

## 2015-07-20 DIAGNOSIS — Z862 Personal history of diseases of the blood and blood-forming organs and certain disorders involving the immune mechanism: Secondary | ICD-10-CM | POA: Diagnosis not present

## 2015-07-20 DIAGNOSIS — R011 Cardiac murmur, unspecified: Secondary | ICD-10-CM | POA: Diagnosis not present

## 2015-07-20 DIAGNOSIS — O2341 Unspecified infection of urinary tract in pregnancy, first trimester: Secondary | ICD-10-CM | POA: Diagnosis not present

## 2015-07-20 DIAGNOSIS — N760 Acute vaginitis: Secondary | ICD-10-CM

## 2015-07-20 DIAGNOSIS — O9989 Other specified diseases and conditions complicating pregnancy, childbirth and the puerperium: Secondary | ICD-10-CM | POA: Diagnosis present

## 2015-07-20 DIAGNOSIS — O23591 Infection of other part of genital tract in pregnancy, first trimester: Secondary | ICD-10-CM | POA: Insufficient documentation

## 2015-07-20 LAB — CBC WITH DIFFERENTIAL/PLATELET
Basophils Absolute: 0 10*3/uL (ref 0.0–0.1)
Basophils Relative: 0 %
Eosinophils Absolute: 0.1 10*3/uL (ref 0.0–0.7)
Eosinophils Relative: 2 %
HCT: 33.6 % — ABNORMAL LOW (ref 36.0–46.0)
Hemoglobin: 10.8 g/dL — ABNORMAL LOW (ref 12.0–15.0)
Lymphocytes Relative: 27 %
Lymphs Abs: 1.6 10*3/uL (ref 0.7–4.0)
MCH: 26.2 pg (ref 26.0–34.0)
MCHC: 32.1 g/dL (ref 30.0–36.0)
MCV: 81.4 fL (ref 78.0–100.0)
Monocytes Absolute: 0.4 10*3/uL (ref 0.1–1.0)
Monocytes Relative: 7 %
Neutro Abs: 3.7 10*3/uL (ref 1.7–7.7)
Neutrophils Relative %: 64 %
Platelets: 227 10*3/uL (ref 150–400)
RBC: 4.13 MIL/uL (ref 3.87–5.11)
RDW: 13.7 % (ref 11.5–15.5)
WBC: 5.8 10*3/uL (ref 4.0–10.5)

## 2015-07-20 LAB — COMPREHENSIVE METABOLIC PANEL
ALT: 10 U/L — ABNORMAL LOW (ref 14–54)
AST: 16 U/L (ref 15–41)
Albumin: 3.4 g/dL — ABNORMAL LOW (ref 3.5–5.0)
Alkaline Phosphatase: 46 U/L (ref 38–126)
Anion gap: 9 (ref 5–15)
BUN: 7 mg/dL (ref 6–20)
CO2: 25 mmol/L (ref 22–32)
Calcium: 9.3 mg/dL (ref 8.9–10.3)
Chloride: 103 mmol/L (ref 101–111)
Creatinine, Ser: 0.61 mg/dL (ref 0.44–1.00)
GFR calc Af Amer: >60 mL/min (ref >60)
GFR calc non Af Amer: >60 mL/min (ref >60)
Glucose, Bld: 111 mg/dL — ABNORMAL HIGH (ref 65–99)
Potassium: 3.7 mmol/L (ref 3.5–5.1)
Sodium: 137 mmol/L (ref 135–145)
Total Bilirubin: 0.8 mg/dL (ref 0.3–1.2)
Total Protein: 7 g/dL (ref 6.5–8.1)

## 2015-07-20 LAB — URINALYSIS, ROUTINE W REFLEX MICROSCOPIC
Bilirubin Urine: NEGATIVE
Glucose, UA: NEGATIVE mg/dL
Hgb urine dipstick: NEGATIVE
Ketones, ur: NEGATIVE mg/dL
Nitrite: NEGATIVE
Protein, ur: NEGATIVE mg/dL
Specific Gravity, Urine: 1.025 (ref 1.005–1.030)
pH: 8 (ref 5.0–8.0)

## 2015-07-20 LAB — URINE MICROSCOPIC-ADD ON

## 2015-07-20 LAB — POC URINE PREG, ED: Preg Test, Ur: POSITIVE — AB

## 2015-07-20 NOTE — ED Notes (Signed)
Pt c/o lower abd and lower back pain onset last pm.  Also nausea without vomiting.  Pt denies any vag. discharge

## 2015-07-20 NOTE — ED Provider Notes (Signed)
CSN: [id number]     Arrival date & time 07/20/15  2036 History   First MD Initiated Contact with Patient 07/20/15 2326     Chief Complaint  Patient presents with  . Abdominal Pain     (Consider location/radiation/quality/duration/timing/severity/associated sxs/prior Treatment) The history is provided by the patient, a significant other and medical records. No language interpreter was used.    Doristine MangoBrittany Demaria is a 25 y.o. female  with a hx of anemia, HTN presents to the Emergency Department complaining of gradual, persistent, progressively worsening lower abd pain described as cramping onset several weeks ago.  Pt reports worsening of her nausea without vomiting in the last week.  She also reports associated low back pain onset last night. No aggravating or alleviating factors. Patient denies fever, chills, headache or neck pain, chest pain, shortness of breath, weakness, dizziness, syncope.  LMP: "sometime last month"  Pt reports normally regular menses, but has been irregular since stopping oral contraceptives.  Pt reports she couldn't afford the medications.   Pt reports she is intermittently sexually active.     Past Medical History  Diagnosis Date  . Anemia   . Scoliosis   . Heart murmur   . CIN II (cervical intraepithelial neoplasia II)   . History of gestational hypertension   . Hypertension    Past Surgical History  Procedure Laterality Date  . Leep  07/14/14    CIN II with negative margins   Family History  Problem Relation Age of Onset  . Anemia Mother   . Migraines Mother   . Mitral valve prolapse Sister   . Heart murmur Brother   . Heart attack Maternal Grandmother   . Mitral valve prolapse Maternal Grandmother   . Heart disease Maternal Grandmother   . Diabetes Maternal Grandmother   . Hyperlipidemia Maternal Grandmother   . Hypertension Maternal Grandmother    Social History  Substance Use Topics  . Smoking status: Never Smoker   . Smokeless tobacco: Never Used   . Alcohol Use: No   OB History    Gravida Para Term Preterm AB TAB SAB Ectopic Multiple Living   2 2 2  0 0 0 0 0 0 2     Review of Systems  Constitutional: Negative for fever, diaphoresis, appetite change, fatigue and unexpected weight change.  HENT: Negative for mouth sores.   Eyes: Negative for visual disturbance.  Respiratory: Negative for cough, chest tightness, shortness of breath and wheezing.   Cardiovascular: Negative for chest pain.  Gastrointestinal: Positive for nausea and abdominal pain (low). Negative for vomiting, diarrhea and constipation.  Endocrine: Negative for polydipsia, polyphagia and polyuria.  Genitourinary: Negative for dysuria, urgency, frequency and hematuria.  Musculoskeletal: Positive for back pain ( low). Negative for neck stiffness.  Skin: Negative for rash.  Allergic/Immunologic: Negative for immunocompromised state.  Neurological: Negative for syncope, light-headedness and headaches.  Hematological: Does not bruise/bleed easily.  Psychiatric/Behavioral: Negative for sleep disturbance. The patient is not nervous/anxious.       Allergies  Review of patient's allergies indicates no known allergies.  Home Medications   Prior to Admission medications   Medication Sig Start Date End Date Taking? Authorizing Provider  Alum & Mag Hydroxide-Simeth (MAGIC MOUTHWASH W/LIDOCAINE) SOLN Take 5 mLs by mouth 4 (four) times daily as needed for mouth pain. 09/02/14   Antony MaduraKelly Humes, PA-C  fluticasone (FLONASE) 50 MCG/ACT nasal spray Place 2 sprays into both nostrils daily. 09/02/14   Antony MaduraKelly Humes, PA-C  naproxen (NAPROSYN) 500 MG tablet  Take 1 tablet (500 mg total) by mouth 2 (two) times daily with a meal. 11/20/14   Garlon Hatchet, PA-C  nitrofurantoin, macrocrystal-monohydrate, (MACROBID) 100 MG capsule Take 1 capsule (100 mg total) by mouth 2 (two) times daily. X 7 days 07/21/15   Dahlia Client Caryle Helgeson, PA-C  penicillin v potassium (VEETID) 500 MG tablet Take 1 tablet (500  mg total) by mouth 4 (four) times daily. 11/20/14   Garlon Hatchet, PA-C   BP 134/95 mmHg  Pulse 87  Temp(Src) 98.1 F (36.7 C) (Oral)  Resp 18  Ht 5\' 7"  (1.702 m)  Wt 69.911 kg  BMI 24.13 kg/m2  SpO2 100%  LMP 06/20/2015 Physical Exam  Constitutional: She appears well-developed and well-nourished. No distress.  Awake, alert, nontoxic appearance  HENT:  Head: Normocephalic and atraumatic.  Mouth/Throat: Oropharynx is clear and moist. No oropharyngeal exudate.  Eyes: Conjunctivae are normal. No scleral icterus.  Neck: Normal range of motion. Neck supple.  Cardiovascular: Normal rate, regular rhythm, normal heart sounds and intact distal pulses.   No murmur heard. Pulmonary/Chest: Effort normal and breath sounds normal. No respiratory distress. She has no wheezes.  Equal chest expansion  Abdominal: Soft. Bowel sounds are normal. She exhibits no distension and no mass. There is no tenderness. There is no rebound and no guarding. Hernia confirmed negative in the right inguinal area and confirmed negative in the left inguinal area.  No CVA tenderness  Genitourinary: Uterus normal. No labial fusion. There is no rash, tenderness or lesion on the right labia. There is no rash, tenderness or lesion on the left labia. Uterus is not deviated, not enlarged, not fixed and not tender. Cervix exhibits no motion tenderness, no discharge and no friability. Right adnexum displays no mass, no tenderness and no fullness. Left adnexum displays no mass, no tenderness and no fullness. No erythema, tenderness or bleeding in the vagina. No foreign body around the vagina. No signs of injury around the vagina. Vaginal discharge (thick, white, fishy odor) found.  Musculoskeletal: Normal range of motion. She exhibits no edema.  Lymphadenopathy:       Right: No inguinal adenopathy present.       Left: No inguinal adenopathy present.  Neurological: She is alert.  Speech is clear and goal oriented Moves extremities  without ataxia  Skin: Skin is warm and dry. She is not diaphoretic. No erythema.  Psychiatric: She has a normal mood and affect.  Nursing note and vitals reviewed.   ED Course  Procedures (including critical care time) Labs Review Labs Reviewed  URINALYSIS, ROUTINE W REFLEX MICROSCOPIC (NOT AT [facility]) - Abnormal; Notable for the following:    APPearance CLOUDY (*)    Leukocytes, UA SMALL (*)    All other components within normal limits  CBC WITH DIFFERENTIAL/PLATELET - Abnormal; Notable for the following:    Hemoglobin 10.8 (*)    HCT 33.6 (*)    All other components within normal limits  COMPREHENSIVE METABOLIC PANEL - Abnormal; Notable for the following:    Glucose, Bld 111 (*)    Albumin 3.4 (*)    ALT 10 (*)    All other components within normal limits  URINE MICROSCOPIC-ADD ON - Abnormal; Notable for the following:    Squamous Epithelial / LPF 6-30 (*)    Bacteria, UA MANY (*)    All other components within normal limits  POC URINE PREG, ED - Abnormal; Notable for the following:    Preg Test, Ur POSITIVE (*)  All other components within normal limits  WET PREP, GENITAL  URINE CULTURE  HCG, QUANTITATIVE, PREGNANCY  ABO/RH  GC/CHLAMYDIA PROBE AMP ([place]) NOT AT [facility]    Imaging Review US Ob Comp Less 14 Wks  07/21/2015  CLINICAL DATA:  Lower abdominal and back pain during pregnancy. Estimated gestational age by LMP is 4 weeks 5 days. Positive urine pregnancy test. No quantitative beta HCG level is reported. EXAM: OBSTETRIC <14 WK ULTRASOUND TECHNIQUE: Transabdominal ultrasound was performed for evaluation of the gestation as well as the maternal uterus and adnexal regions. COMPARISON:  None. FINDINGS: Intrauterine gestational sac: A single intrauterine pregnancy is identified. Yolk sac:  Yolk sac is present. Embryo:  Fetal pole is present. Cardiac Activity: Fetal cardiac activity is observed. Heart Rate: 153 bpm CRL:   18.6  mm   8 w 3 d                  [place] EDC:  02/27/2016 Subchorionic hemorrhage:  None visualized. Maternal uterus/adnexae: Uterus is anteverted. No myometrial mass lesions identified. Both ovaries are visualized and appear normal. Corpus luteal cyst on the right ovary. No abnormal adnexal mass lesions. No free fluid in the pelvis. IMPRESSION: Single intrauterine pregnancy is identified. Estimated gestational age by crown-rump length is 8 weeks 3 days. No acute complication demonstrated. Electronically Signed   By: Burman Nieves M.D.   On: 07/21/2015 00:34   I have personally reviewed and evaluated these images and lab results as part of my medical decision-making.    MDM   Final diagnoses:  Pregnancy  Lower abdominal pain  UTI (lower urinary tract infection)   [place] Glaab presents with lower abd pain and low back pain.  Labs with Positive pregnancy test. Mild anemia at 10.8. Urinalysis with leukocytes white blood cells and many bacteria. Patient without overt dysuria but lower abdominal pain and low back pain. Will treat for potential UTI. Urine culture sent. No vaginal bleeding.  [place] with IUP at [redacted]w[redacted]d.  Pelvic exam without adnexal tenderness or CMT.  No vaginal bleeding.  Wet prep with clue cells.  Will treat BV with metrogel.  Pt is to begin prenatal vitamins and follow-up for prenatal appointment.  Abd remains soft and nontender on exam.  Discussed all findings and plan.  Pt states understanding and is in agreement with the plan.    BP 133/92 mmHg  Pulse 72  Temp(Src) 98.1 F (36.7 C) (Oral)  Resp 18  Ht  (1.702 m)  Wt 69.911 kg  BMI 24.13 kg/m2  SpO2 100%  LMP 06/20/2015   Dierdre Forth, PA-C 07/21/15 1610  Pricilla Loveless, MD 07/22/15 [phone]

## 2015-07-21 ENCOUNTER — Other Ambulatory Visit (HOSPITAL_COMMUNITY): Payer: Self-pay

## 2015-07-21 ENCOUNTER — Emergency Department (HOSPITAL_COMMUNITY): Payer: Medicaid Other

## 2015-07-21 LAB — WET PREP, GENITAL
Sperm: NONE SEEN
Trich, Wet Prep: NONE SEEN
Yeast Wet Prep HPF POC: NONE SEEN

## 2015-07-21 LAB — ABO/RH: ABO/RH(D): A POS

## 2015-07-21 LAB — HCG, QUANTITATIVE, PREGNANCY: hCG, Beta Chain, Quant, S: 162517 m[IU]/mL — ABNORMAL HIGH (ref <5)

## 2015-07-21 LAB — HIV ANTIBODY (ROUTINE TESTING W REFLEX): HIV Screen 4th Generation wRfx: NONREACTIVE

## 2015-07-21 LAB — RPR: RPR Ser Ql: NONREACTIVE

## 2015-07-21 MED ORDER — SODIUM CHLORIDE 0.9 % IV BOLUS (SEPSIS)
1000.0000 mL | Freq: Once | INTRAVENOUS | Status: AC
  Administered 2015-07-21: 1000 mL via INTRAVENOUS

## 2015-07-21 MED ORDER — NITROFURANTOIN MONOHYD MACRO 100 MG PO CAPS
100.0000 mg | ORAL_CAPSULE | Freq: Once | ORAL | Status: AC
  Administered 2015-07-21: 100 mg via ORAL
  Filled 2015-07-21: qty 1

## 2015-07-21 MED ORDER — METRONIDAZOLE 0.75 % VA GEL: 1.0000 | Freq: Two times a day (BID) | VAGINAL | Status: DC

## 2015-07-21 MED ORDER — NITROFURANTOIN MONOHYD MACRO 100 MG PO CAPS
100.0000 mg | ORAL_CAPSULE | Freq: Two times a day (BID) | ORAL | Status: DC
Start: 2015-07-21 — End: 2015-09-21

## 2015-07-21 NOTE — Discharge Instructions (Signed)
1. Medications: Macrobid, usual home medications 2. Treatment: rest, drink plenty of fluids,  3. Follow Up: Please followup with OB/GYN in 7-10 days for discussion of your diagnoses and further evaluation after today's visit; if you do not have a primary care doctor use the resource guide provided to find one; Please return to the ER for worsening symptoms, vaginal bleeding

## 2015-07-21 NOTE — ED Notes (Signed)
Patient presents stating that she has been feeling nauseated for the past couple of days. Denies vomiting.  Stated they told her that she was pregnant.

## 2015-07-21 NOTE — ED Notes (Signed)
Discharge instructions and prescriptions reviewed - voiced understanding 

## 2015-07-22 LAB — URINE CULTURE

## 2015-07-22 LAB — GC/CHLAMYDIA PROBE AMP (~~LOC~~) NOT AT ARMC
Chlamydia: NEGATIVE
Neisseria Gonorrhea: NEGATIVE

## 2015-08-22 ENCOUNTER — Ambulatory Visit (INDEPENDENT_AMBULATORY_CARE_PROVIDER_SITE_OTHER): Payer: Self-pay | Admitting: Student

## 2015-08-22 ENCOUNTER — Encounter: Payer: Self-pay | Admitting: *Deleted

## 2015-08-22 ENCOUNTER — Encounter: Payer: Self-pay | Admitting: Student

## 2015-08-22 VITALS — BP 122/74 | HR 67 | Wt 148.7 lb

## 2015-08-22 DIAGNOSIS — O09293 Supervision of pregnancy with other poor reproductive or obstetric history, third trimester: Secondary | ICD-10-CM | POA: Insufficient documentation

## 2015-08-22 DIAGNOSIS — O09291 Supervision of pregnancy with other poor reproductive or obstetric history, first trimester: Secondary | ICD-10-CM

## 2015-08-22 DIAGNOSIS — Z3491 Encounter for supervision of normal pregnancy, unspecified, first trimester: Secondary | ICD-10-CM

## 2015-08-22 DIAGNOSIS — O26891 Other specified pregnancy related conditions, first trimester: Secondary | ICD-10-CM

## 2015-08-22 DIAGNOSIS — R87612 Low grade squamous intraepithelial lesion on cytologic smear of cervix (LGSIL): Secondary | ICD-10-CM

## 2015-08-22 DIAGNOSIS — Z3481 Encounter for supervision of other normal pregnancy, first trimester: Secondary | ICD-10-CM

## 2015-08-22 DIAGNOSIS — N898 Other specified noninflammatory disorders of vagina: Secondary | ICD-10-CM

## 2015-08-22 DIAGNOSIS — Z3493 Encounter for supervision of normal pregnancy, unspecified, third trimester: Secondary | ICD-10-CM | POA: Insufficient documentation

## 2015-08-22 LAB — COMPLETE METABOLIC PANEL WITH GFR
ALT: 8 U/L (ref 6–29)
AST: 15 U/L (ref 10–30)
Albumin: 3.8 g/dL (ref 3.6–5.1)
Alkaline Phosphatase: 50 U/L (ref 33–115)
BUN: 9 mg/dL (ref 7–25)
CO2: 25 mmol/L (ref 20–31)
Calcium: 9 mg/dL (ref 8.6–10.2)
Chloride: 103 mmol/L (ref 98–110)
Creat: 0.49 mg/dL — ABNORMAL LOW (ref 0.50–1.10)
GFR, Est African American: >89 mL/min (ref >=60)
GFR, Est Non African American: >89 mL/min (ref >=60)
Glucose, Bld: 79 mg/dL (ref 65–99)
Potassium: 4.1 mmol/L (ref 3.5–5.3)
Sodium: 137 mmol/L (ref 135–146)
Total Bilirubin: 0.5 mg/dL (ref 0.2–1.2)
Total Protein: 6.8 g/dL (ref 6.1–8.1)

## 2015-08-22 LAB — POCT URINALYSIS DIP (DEVICE)
Bilirubin Urine: NEGATIVE
Glucose, UA: NEGATIVE mg/dL
Hgb urine dipstick: NEGATIVE
Ketones, ur: NEGATIVE mg/dL
Leukocytes, UA: NEGATIVE
Nitrite: NEGATIVE
Protein, ur: 30 mg/dL — AB
Specific Gravity, Urine: 1.025 (ref 1.005–1.030)
Urobilinogen, UA: 2 mg/dL — ABNORMAL HIGH (ref 0.0–1.0)
pH: 6.5 (ref 5.0–8.0)

## 2015-08-22 NOTE — Patient Instructions (Addendum)
Second Trimester of Pregnancy The second trimester is from week 13 through week 28, months 4 through 6. The second trimester is often a time when you feel your best. Your body has also adjusted to being pregnant, and you begin to feel better physically. Usually, morning sickness has lessened or quit completely, you may have more energy, and you may have an increase in appetite. The second trimester is also a time when the fetus is growing rapidly. At the end of the sixth month, the fetus is about 9 inches long and weighs about 1 pounds. You will likely begin to feel the baby move (quickening) between 18 and 20 weeks of the pregnancy. BODY CHANGES Your body goes through many changes during pregnancy. The changes vary from woman to woman.   Your weight will continue to increase. You will notice your lower abdomen bulging out.  You may begin to get stretch marks on your hips, abdomen, and breasts.  You may develop headaches that can be relieved by medicines approved by your health care provider.  You may urinate more often because the fetus is pressing on your bladder.  You may develop or continue to have heartburn as a result of your pregnancy.  You may develop constipation because certain hormones are causing the muscles that push waste through your intestines to slow down.  You may develop hemorrhoids or swollen, bulging veins (varicose veins).  You may have back pain because of the weight gain and pregnancy hormones relaxing your joints between the bones in your pelvis and as a result of a shift in weight and the muscles that support your balance.  Your breasts will continue to grow and be tender.  Your gums may bleed and may be sensitive to brushing and flossing.  Dark spots or blotches (chloasma, mask of pregnancy) may develop on your face. This will likely fade after the baby is born.  A dark line from your belly button to the pubic area (linea nigra) may appear. This will likely fade  after the baby is born.  You may have changes in your hair. These can include thickening of your hair, rapid growth, and changes in texture. Some women also have hair loss during or after pregnancy, or hair that feels dry or thin. Your hair will most likely return to normal after your baby is born. WHAT TO EXPECT AT YOUR PRENATAL VISITS During a routine prenatal visit:  You will be weighed to make sure you and the fetus are growing normally.  Your blood pressure will be taken.  Your abdomen will be measured to track your baby's growth.  The fetal heartbeat will be listened to.  Any test results from the previous visit will be discussed. Your health care provider may ask you:  How you are feeling.  If you are feeling the baby move.  If you have had any abnormal symptoms, such as leaking fluid, bleeding, severe headaches, or abdominal cramping.  If you are using any tobacco products, including cigarettes, chewing tobacco, and electronic cigarettes.  If you have any questions. Other tests that may be performed during your second trimester include:  Blood tests that check for:  Low iron levels (anemia).  Gestational diabetes (between 24 and 28 weeks).  Rh antibodies.  Urine tests to check for infections, diabetes, or protein in the urine.  An ultrasound to confirm the proper growth and development of the baby.  An amniocentesis to check for possible genetic problems.  Fetal screens for spina bifida   and Down syndrome.  HIV (human immunodeficiency virus) testing. Routine prenatal testing includes screening for HIV, unless you choose not to have this test. HOME CARE INSTRUCTIONS   Avoid all smoking, herbs, alcohol, and unprescribed drugs. These chemicals affect the formation and growth of the baby.  Do not use any tobacco products, including cigarettes, chewing tobacco, and electronic cigarettes. If you need help quitting, ask your health care provider. You may receive  counseling support and other resources to help you quit.  Follow your health care provider's instructions regarding medicine use. There are medicines that are either safe or unsafe to take during pregnancy.  Exercise only as directed by your health care provider. Experiencing uterine cramps is a good sign to stop exercising.  Continue to eat regular, healthy meals.  Wear a good support bra for breast tenderness.  Do not use hot tubs, steam rooms, or saunas.  Wear your seat belt at all times when driving.  Avoid raw meat, uncooked cheese, cat litter boxes, and soil used by cats. These carry germs that can cause birth defects in the baby.  Take your prenatal vitamins.  Take 1500-2000 mg of calcium daily starting at the 20th week of pregnancy until you deliver your baby.  Try taking a stool softener (if your health care provider approves) if you develop constipation. Eat more high-fiber foods, such as fresh vegetables or fruit and whole grains. Drink plenty of fluids to keep your urine clear or pale yellow.  Take warm sitz baths to soothe any pain or discomfort caused by hemorrhoids. Use hemorrhoid cream if your health care provider approves.  If you develop varicose veins, wear support hose. Elevate your feet for 15 minutes, 3-4 times a day. Limit salt in your diet.  Avoid heavy lifting, wear low heel shoes, and practice good posture.  Rest with your legs elevated if you have leg cramps or low back pain.  Visit your dentist if you have not gone yet during your pregnancy. Use a soft toothbrush to brush your teeth and be gentle when you floss.  A sexual relationship may be continued unless your health care provider directs you otherwise.  Continue to go to all your prenatal visits as directed by your health care provider. SEEK MEDICAL CARE IF:   You have dizziness.  You have mild pelvic cramps, pelvic pressure, or nagging pain in the abdominal area.  You have persistent nausea,  vomiting, or diarrhea.  You have a bad smelling vaginal discharge.  You have pain with urination. SEEK IMMEDIATE MEDICAL CARE IF:   You have a fever.  You are leaking fluid from your vagina.  You have spotting or bleeding from your vagina.  You have severe abdominal cramping or pain.  You have rapid weight gain or loss.  You have shortness of breath with chest pain.  You notice sudden or extreme swelling of your face, hands, ankles, feet, or legs.  You have not felt your baby move in over an hour.  You have severe headaches that do not go away with medicine.  You have vision changes.   This information is not intended to replace advice given to you by your health care provider. Make sure you discuss any questions you have with your health care provider.   Document Released: 02/26/2001 Document Revised: 03/25/2014 Document Reviewed: 05/05/2012 Elsevier Interactive Patient Education 2016 [employer]    Safe Medications in Pregnancy   Acne: Benzoyl Peroxide Salicylic Acid  Backache/Headache: Tylenol: 2 regular strength every 4   hours OR              2 Extra strength every 6 hours  Colds/Coughs/Allergies: Benadryl (alcohol free) 25 mg every 6 hours as needed Breath right strips Claritin Cepacol throat lozenges Chloraseptic throat spray Cold-Eeze- up to three times per day Cough drops, alcohol free Flonase (by prescription only) Guaifenesin Mucinex Robitussin DM (plain only, alcohol free) Saline nasal spray/drops Sudafed (pseudoephedrine) & Actifed ** use only after [redacted] weeks gestation and if you do not have high blood pressure Tylenol Vicks Vaporub Zinc lozenges Zyrtec   Constipation: Colace Ducolax suppositories Fleet enema Glycerin suppositories Metamucil Milk of magnesia Miralax Senokot Smooth move tea  Diarrhea: Kaopectate Imodium A-D  *NO pepto Bismol  Hemorrhoids: Anusol Anusol HC Preparation  H Tucks  Indigestion: Tums Maalox Mylanta Zantac  Pepcid  Insomnia: Benadryl (alcohol free) 25mg  every 6 hours as needed Tylenol PM Unisom, no Gelcaps  Leg Cramps: Tums MagGel  Nausea/Vomiting:  Bonine Dramamine Emetrol Ginger extract Sea bands Meclizine  Nausea medication to take during pregnancy:  Unisom (doxylamine succinate 25 mg tablets) Take one tablet daily at bedtime. If symptoms are not adequately controlled, the dose can be increased to a maximum recommended dose of two tablets daily (1/2 tablet in the morning, 1/2 tablet mid-afternoon and one at bedtime). Vitamin B6 100mg  tablets. Take one tablet twice a day (up to 200 mg per day).  Skin Rashes: Aveeno products Benadryl cream or 25mg  every 6 hours as needed Calamine Lotion 1% cortisone cream  Yeast infection: Gyne-lotrimin 7 Monistat 7  Gum/tooth pain: Anbesol  **If taking multiple medications, please check labels to avoid duplicating the same active ingredients **take medication as directed on the label ** Do not exceed 4000 mg of tylenol in 24 hours **Do not take medications that contain aspirin or ibuprofen    Contraception Choices Contraception (birth control) is the use of any methods or devices to prevent pregnancy. Below are some methods to help avoid pregnancy. HORMONAL METHODS   Contraceptive implant. This is a thin, plastic tube containing progesterone hormone. It does not contain estrogen hormone. Your health care provider inserts the tube in the inner part of the upper arm. The tube can remain in place for up to 3 years. After 3 years, the implant must be removed. The implant prevents the ovaries from releasing an egg (ovulation), thickens the cervical mucus to prevent sperm from entering the uterus, and thins the lining of the inside of the uterus.  Progesterone-only injections. These injections are given every 3 months by your health care provider to prevent pregnancy. This synthetic  progesterone hormone stops the ovaries from releasing eggs. It also thickens cervical mucus and changes the uterine lining. This makes it harder for sperm to survive in the uterus.  Birth control pills. These pills contain estrogen and progesterone hormone. They work by preventing the ovaries from releasing eggs (ovulation). They also cause the cervical mucus to thicken, preventing the sperm from entering the uterus. Birth control pills are prescribed by a health care provider.Birth control pills can also be used to treat heavy periods.  Minipill. This type of birth control pill contains only the progesterone hormone. They are taken every day of each month and must be prescribed by your health care provider.  Birth control patch. The patch contains hormones similar to those in birth control pills. It must be changed once a week and is prescribed by a health care provider.  Vaginal ring. The ring contains hormones similar to  those in birth control pills. It is left in the vagina for 3 weeks, removed for 1 week, and then a new one is put back in place. The patient must be comfortable inserting and removing the ring from the vagina.A health care provider's prescription is necessary.  Emergency contraception. Emergency contraceptives prevent pregnancy after unprotected sexual intercourse. This pill can be taken right after sex or up to 5 days after unprotected sex. It is most effective the sooner you take the pills after having sexual intercourse. Most emergency contraceptive pills are available without a prescription. Check with your pharmacist. Do not use emergency contraception as your only form of birth control. BARRIER METHODS   Female condom. This is a thin sheath (latex or rubber) that is worn over the penis during sexual intercourse. It can be used with spermicide to increase effectiveness.  Female condom. This is a soft, loose-fitting sheath that is put into the vagina before sexual  intercourse.  Diaphragm. This is a soft, latex, dome-shaped barrier that must be fitted by a health care provider. It is inserted into the vagina, along with a spermicidal jelly. It is inserted before intercourse. The diaphragm should be left in the vagina for 6 to 8 hours after intercourse.  Cervical cap. This is a round, soft, latex or plastic cup that fits over the cervix and must be fitted by a health care provider. The cap can be left in place for up to 48 hours after intercourse.  Sponge. This is a soft, circular piece of polyurethane foam. The sponge has spermicide in it. It is inserted into the vagina after wetting it and before sexual intercourse.  Spermicides. These are chemicals that kill or block sperm from entering the cervix and uterus. They come in the form of creams, jellies, suppositories, foam, or tablets. They do not require a prescription. They are inserted into the vagina with an applicator before having sexual intercourse. The process must be repeated every time you have sexual intercourse. INTRAUTERINE CONTRACEPTION  Intrauterine device (IUD). This is a T-shaped device that is put in a woman's uterus during a menstrual period to prevent pregnancy. There are 2 types:  Copper IUD. This type of IUD is wrapped in copper wire and is placed inside the uterus. Copper makes the uterus and fallopian tubes produce a fluid that kills sperm. It can stay in place for 10 years.  Hormone IUD. This type of IUD contains the hormone progestin (synthetic progesterone). The hormone thickens the cervical mucus and prevents sperm from entering the uterus, and it also thins the uterine lining to prevent implantation of a fertilized egg. The hormone can weaken or kill the sperm that get into the uterus. It can stay in place for 3-5 years, depending on which type of IUD is used. PERMANENT METHODS OF CONTRACEPTION  Female tubal ligation. This is when the woman's fallopian tubes are surgically sealed,  tied, or blocked to prevent the egg from traveling to the uterus.  Hysteroscopic sterilization. This involves placing a small coil or insert into each fallopian tube. Your doctor uses a technique called hysteroscopy to do the procedure. The device causes scar tissue to form. This results in permanent blockage of the fallopian tubes, so the sperm cannot fertilize the egg. It takes about 3 months after the procedure for the tubes to become blocked. You must use another form of birth control for these 3 months.  Female sterilization. This is when the female has the tubes that carry sperm tied off (  vasectomy).This blocks sperm from entering the vagina during sexual intercourse. After the procedure, the man can still ejaculate fluid (semen). NATURAL PLANNING METHODS  Natural family planning. This is not having sexual intercourse or using a barrier method (condom, diaphragm, cervical cap) on days the woman could become pregnant.  Calendar method. This is keeping track of the length of each menstrual cycle and identifying when you are fertile.  Ovulation method. This is avoiding sexual intercourse during ovulation.  Symptothermal method. This is avoiding sexual intercourse during ovulation, using a thermometer and ovulation symptoms.  Post-ovulation method. This is timing sexual intercourse after you have ovulated. Regardless of which type or method of contraception you choose, it is important that you use condoms to protect against the transmission of sexually transmitted infections (STIs). Talk with your health care provider about which form of contraception is most appropriate for you.   This information is not intended to replace advice given to you by your health care provider. Make sure you discuss any questions you have with your health care provider.   Document Released: 03/04/2005 Document Revised: 03/09/2013 Document Reviewed: 08/27/2012 Elsevier Interactive Patient Education [employer].

## 2015-08-22 NOTE — Progress Notes (Signed)
Initial prenatal info packet given Initial labs/schedule u/s today

## 2015-08-22 NOTE — Progress Notes (Signed)
  Subjective:    [name] is being seen today for her first obstetrical visit.  This is not a planned pregnancy. She is at 6270w0d gestation. Her obstetrical history is significant for preeclampsia in previous pregnancy. Relationship with FOB: significant other, living together. Patient does not intend to breast feed. Pregnancy history fully reviewed.  Patient reports no complaints.  Review of Systems:   Review of Systems Review of Systems - History obtained from the patient General ROS: negative  Objective:  FHT 157 by doppler   BP 122/74 mmHg  Pulse 67  Wt 148 lb 11.2 oz (67.45 kg)  LMP 06/11/2015 (LMP Unknown) Physical Exam  Exam Physical Examination: General appearance - alert, well appearing, and in no distress and oriented to person, place, and time Mental status - alert, oriented to person, place, and time, normal mood, behavior, speech, dress, motor activity, and thought processes Mouth - dental hygiene good Heart - normal rate, regular rhythm, normal S1, S2, no murmurs, rubs, clicks or gallops Abdomen - soft, nontender, nondistended, no masses or organomegaly Pelvic - VULVA: normal appearing vulva with no masses, tenderness or lesions, VAGINA: vaginal discharge - large amount of thin white discharge, CERVIX: normal appearing cervix without discharge or lesions, UTERUS: enlarged to 13 week's size, ADNEXA: normal adnexa in size, nontender and no masses, PAP: Pap smear done today, WET MOUNT done, exam chaperoned by RN Neurological - alert, oriented, normal speech, no focal findings or movement disorder noted Musculoskeletal - no joint tenderness, deformity or swelling, full range of motion without pain Skin - normal coloration and turgor, no rashes, no suspicious skin lesions noted    Assessment:    Pregnancy: [id number] Patient Active Problem List   Diagnosis Date Noted  . Supervision of normal pregnancy in first trimester 08/22/2015  . History of pre-eclampsia in prior  pregnancy, currently pregnant in first trimester 08/22/2015  . Moderate dysplasia of cervix (CIN II) 06/09/2014  . Low grade squamous intraepithelial lesion (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 09/09/2012       Plan:  1. Supervision of normal pregnancy in first trimester  - Hemoglobinopathy evaluation - Cytology - PAP - Urine Culture - Prenatal Profile - [id number] PDM PROFILE - GC[facility]Chlamydia [facility] amp [facility] - US MFM OB COMP + 14 WK; Future  2. History of pre-eclampsia in prior pregnancy, currently pregnant in first trimester  - COMPLETE METABOLIC PANEL WITH GFR - Protein / Creatinine Ratio, Urine -Discussed starting low dose aspirin therapy at 16 weeks  3. Vaginal discharge during pregnancy, first trimester  - Wet prep, genital    Initial labs drawn. Prenatal vitamins. Problem list reviewed and updated. AFP3 discussed: requested. -- No appt available for first screen by 1930w6d. Will draw Quad next visit Role of ultrasound in pregnancy discussed; fetal survey: ordered. Amniocentesis discussed: not indicated.    Judeth Hornrin Haig Gerardo 08/22/2015

## 2015-08-23 ENCOUNTER — Other Ambulatory Visit: Payer: Self-pay | Admitting: Student

## 2015-08-23 ENCOUNTER — Telehealth: Payer: Self-pay | Admitting: *Deleted

## 2015-08-23 DIAGNOSIS — N760 Acute vaginitis: Principal | ICD-10-CM

## 2015-08-23 DIAGNOSIS — B9689 Other specified bacterial agents as the cause of diseases classified elsewhere: Secondary | ICD-10-CM

## 2015-08-23 LAB — PROTEIN / CREATININE RATIO, URINE
Creatinine, Urine: 220 mg/dL (ref 20–320)
Protein Creatinine Ratio: 95 mg/g creat (ref 21–161)
Total Protein, Urine: 21 mg/dL (ref 5–24)

## 2015-08-23 LAB — PRENATAL PROFILE (SOLSTAS)
Antibody Screen: NEGATIVE
Basophils Absolute: 0 cells/uL (ref 0–200)
Basophils Relative: 0 %
Eosinophils Absolute: 51 cells/uL (ref 15–500)
Eosinophils Relative: 1 %
HCT: 32.9 % — ABNORMAL LOW (ref 35.0–45.0)
HIV 1&2 Ab, 4th Generation: NONREACTIVE
Hemoglobin: 11 g/dL — ABNORMAL LOW (ref 11.7–15.5)
Hepatitis B Surface Ag: NEGATIVE
Lymphocytes Relative: 22 %
Lymphs Abs: 1122 cells/uL (ref 850–3900)
MCH: 27.1 pg (ref 27.0–33.0)
MCHC: 33.4 g/dL (ref 32.0–36.0)
MCV: 81 fL (ref 80.0–100.0)
MPV: 10.1 fL (ref 7.5–12.5)
Monocytes Absolute: 357 cells/uL (ref 200–950)
Monocytes Relative: 7 %
Neutro Abs: 3570 cells/uL (ref 1500–7800)
Neutrophils Relative %: 70 %
Platelets: 215 10*3/uL (ref 140–400)
RBC: 4.06 MIL/uL (ref 3.80–5.10)
RDW: 15 % (ref 11.0–15.0)
Rh Type: POSITIVE
Rubella: 5.56 Index — ABNORMAL HIGH (ref <0.90)
WBC: 5.1 10*3/uL (ref 3.8–10.8)

## 2015-08-23 LAB — CYTOLOGY - PAP

## 2015-08-23 LAB — WET PREP, GENITAL
Trich, Wet Prep: NONE SEEN
Yeast Wet Prep HPF POC: NONE SEEN

## 2015-08-23 LAB — GC/CHLAMYDIA PROBE AMP (~~LOC~~) NOT AT ARMC
Chlamydia: NEGATIVE
Neisseria Gonorrhea: NEGATIVE

## 2015-08-23 MED ORDER — METRONIDAZOLE 500 MG PO TABS: 500.0000 mg | ORAL_TABLET | Freq: Two times a day (BID) | ORAL | Status: DC

## 2015-08-23 NOTE — Telephone Encounter (Signed)
Called patient and notified here of BV, prescription to her pharmacy, Understanding voiced.

## 2015-08-24 LAB — HEMOGLOBINOPATHY EVALUATION
Hemoglobin Other: 0 %
Hgb A2 Quant: 2.6 % (ref 1.8–3.5)
Hgb A: 97.4 % (ref >96.0)
Hgb F Quant: 0 % (ref <2.0)
Hgb S Quant: 0 %

## 2015-08-24 LAB — URINE CULTURE: Colony Count: 60000

## 2015-08-27 LAB — CP5000051 PDM PROFILE
Amphetamines: NEGATIVE ng/mL (ref <500)
Barbiturates: NEGATIVE ng/mL (ref <300)
Benzodiazepines: NEGATIVE ng/mL (ref <100)
Buprenorphine: NEGATIVE ng/mL (ref <5)
Cocaine Metabolite: NEGATIVE ng/mL (ref <150)
Desmethyltramadol: NEGATIVE ng/mL (ref <100)
Fentanyl: NEGATIVE ng/mL (ref <0.5)
MDMA: NEGATIVE ng/mL (ref <500)
Marijuana Metabolite: NEGATIVE ng/mL (ref <20)
Meperidine: NEGATIVE ng/mL (ref <100)
Meprobamate: NEGATIVE ng/mL (ref <1000)
Methadone Metabolite: NEGATIVE ng/mL (ref <100)
Norfentanyl: NEGATIVE ng/mL (ref <0.5)
Normeperidine: NEGATIVE ng/mL (ref <100)
Nortapentadol: NEGATIVE ng/mL (ref <50)
Opiates: NEGATIVE ng/mL (ref <100)
Oxycodone: NEGATIVE ng/mL (ref <100)
Phencyclidine: NEGATIVE ng/mL (ref <25)
Propoxyphene: NEGATIVE ng/mL (ref <300)
Tapentadol: NEGATIVE ng/mL (ref <50)
Tramadol: NEGATIVE ng/mL (ref <100)
ZOLPIDEM METABOLITE: NEGATIVE ng/mL (ref <5)
ZOLPIDEM: NEGATIVE ng/mL (ref <5)

## 2015-09-20 ENCOUNTER — Encounter (HOSPITAL_COMMUNITY): Payer: Self-pay | Admitting: Student

## 2015-09-21 ENCOUNTER — Encounter: Payer: Self-pay | Admitting: Clinical

## 2015-09-21 ENCOUNTER — Ambulatory Visit (INDEPENDENT_AMBULATORY_CARE_PROVIDER_SITE_OTHER): Payer: Self-pay | Admitting: Family Medicine

## 2015-09-21 VITALS — BP 123/66 | HR 74 | Wt 150.1 lb

## 2015-09-21 DIAGNOSIS — Z3492 Encounter for supervision of normal pregnancy, unspecified, second trimester: Secondary | ICD-10-CM

## 2015-09-21 LAB — POCT URINALYSIS DIP (DEVICE)
Bilirubin Urine: NEGATIVE
Glucose, UA: NEGATIVE mg/dL
Hgb urine dipstick: NEGATIVE
Ketones, ur: 15 mg/dL — AB
Leukocytes, UA: NEGATIVE
Nitrite: NEGATIVE
Protein, ur: 30 mg/dL — AB
Specific Gravity, Urine: 1.02 (ref 1.005–1.030)
Urobilinogen, UA: 1 mg/dL (ref 0.0–1.0)
pH: 6.5 (ref 5.0–8.0)

## 2015-09-21 NOTE — Progress Notes (Signed)
Subjective:  [name] is a 25 y.o. G3P2002 at 913w2d being seen today for ongoing prenatal care.  She is currently monitored for the following issues for this low-risk pregnancy and has Low grade squamous intraepithelial lesion (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; Moderate dysplasia of cervix (CIN II); Supervision of normal pregnancy in first trimester; and History of pre-eclampsia in prior pregnancy, currently pregnant in first trimester on her problem list.  Patient reports right lower back pain for two weeks. Pain is constant. Pain 0/10. Last pain two days ago. No history of trauma or fall. She has history of scoliosis. Denies dysuria, fever or chills. Contractions: Not present. Vag. Bleeding: None.  Movement: Present. Denies leaking of fluid. Eating and drinking well.   The following portions of the patient's history were reviewed and updated as appropriate: allergies, current medications, past family history, past medical history, past social history, past surgical history and problem list. Problem list updated.  Objective:   Filed Vitals:   09/21/15 1431  BP: 123/66  Pulse: 74  Weight: 150 lb 1.6 oz (68.085 kg)    Fetal Status:     Movement: Present   HR 149BPM by doppler  General:  Alert, oriented and cooperative. Patient is in no acute distress.  Skin: Skin is warm and dry. No rash noted.   Cardiovascular: Normal heart rate noted  Respiratory: Normal respiratory effort, no problems with respiration noted  Abdomen: Soft, gravid, appropriate for gestational age. Pain/Pressure: Present     Pelvic:  Cervical exam deferred        Extremities: Normal range of motion.  Edema: Trace  Mental Status: Normal mood and affect. Normal behavior. Normal judgment and thought content.    Assessment and Plan:  Pregnancy: G3P2002 at 5613w2d. Doing well. -Quad screen today -Anatomy US ordered  Back pain: resolved now. History of scoliosis -Tylenol, ice and heat as needed   There are no diagnoses  linked to this encounter. Preterm labor symptoms and general obstetric precautions including but not limited to vaginal bleeding, contractions, leaking of fluid and fetal movement were reviewed in detail with the patient. Please refer to After Visit Summary for other counseling recommendations.   Return in 4 weeks

## 2015-09-22 LAB — AFP, QUAD SCREEN
AFP: 57.8 ng/mL
Age Alone: 1:956 {titer}
Curr Gest Age: 17.3 weeks
Down Syndrome Scr Risk Est: 1:7180 {titer}
HCG, Total: 46.11 IU/mL
INH: 114.3 pg/mL
Interpretation-AFP: NEGATIVE
MoM for AFP: 1.28
MoM for INH: 0.67
MoM for hCG: 1.36
Open Spina bifida: NEGATIVE
Osb Risk: 1:10800 {titer}
Tri 18 Scr Risk Est: NEGATIVE
Trisomy 18 (Edward) Syndrome Interp.: 1:40400 {titer}
uE3 Mom: 0.69
uE3 Value: 0.77 ng/mL

## 2015-09-28 ENCOUNTER — Ambulatory Visit (HOSPITAL_COMMUNITY)
Admission: RE | Admit: 2015-09-28 | Discharge: 2015-09-28 | Disposition: A | Discharge status: Home / Self Care | Payer: Medicaid Other | Source: Ambulatory Visit | Attending: Student | Admitting: Student

## 2015-09-28 ENCOUNTER — Encounter (HOSPITAL_COMMUNITY): Payer: Self-pay

## 2015-09-28 DIAGNOSIS — Z3491 Encounter for supervision of normal pregnancy, unspecified, first trimester: Secondary | ICD-10-CM

## 2015-09-28 DIAGNOSIS — Z3481 Encounter for supervision of other normal pregnancy, first trimester: Secondary | ICD-10-CM | POA: Insufficient documentation

## 2015-10-25 ENCOUNTER — Encounter: Payer: Self-pay | Admitting: Family

## 2015-10-25 ENCOUNTER — Ambulatory Visit (INDEPENDENT_AMBULATORY_CARE_PROVIDER_SITE_OTHER): Payer: Medicaid Other | Admitting: Family

## 2015-10-25 VITALS — BP 127/70 | HR 72 | Wt 153.3 lb

## 2015-10-25 DIAGNOSIS — O09291 Supervision of pregnancy with other poor reproductive or obstetric history, first trimester: Secondary | ICD-10-CM

## 2015-10-25 DIAGNOSIS — Z3492 Encounter for supervision of normal pregnancy, unspecified, second trimester: Secondary | ICD-10-CM | POA: Diagnosis present

## 2015-10-25 DIAGNOSIS — O09292 Supervision of pregnancy with other poor reproductive or obstetric history, second trimester: Secondary | ICD-10-CM | POA: Diagnosis not present

## 2015-10-25 DIAGNOSIS — Z3491 Encounter for supervision of normal pregnancy, unspecified, first trimester: Secondary | ICD-10-CM

## 2015-10-25 LAB — POCT URINALYSIS DIP (DEVICE)
Bilirubin Urine: NEGATIVE
Glucose, UA: NEGATIVE mg/dL
Ketones, ur: NEGATIVE mg/dL
Nitrite: NEGATIVE
Protein, ur: NEGATIVE mg/dL
Specific Gravity, Urine: 1.02 (ref 1.005–1.030)
Urobilinogen, UA: 1 mg/dL (ref 0.0–1.0)
pH: 7 (ref 5.0–8.0)

## 2015-10-25 NOTE — Progress Notes (Signed)
C/o braxton hicks contractions occasionally- not every day.

## 2015-10-25 NOTE — Patient Instructions (Addendum)
CIRCUMCISION  Circumcision is considered an elective/non-medically necessary procedure. There are many reasons parents decide to have their sons circumsized. During the first year of life circumcised males have a reduced risk of urinary tract infections but after this year the rates between circumcised males and uncircumcised males are the same.  It is safe to have your son circumcised outside of the hospital and the places above perform them regularly.    Places to have your son circumcised:    [facility]   [phone] $480 by 4 wks  [facility] [phone] $244 by 4 wks  [facility] [phone] $175 by 2 wks  [facility] [phone] $250 by 7 days [facility]   [phone] $150 by 4 wks  These prices sometimes change but are roughly what you can expect to pay. Please call and confirm pricing.        Second Trimester of Pregnancy The second trimester is from week 13 through week 28, months 4 through 6. The second trimester is often a time when you feel your best. Your body has also adjusted to being pregnant, and you begin to feel better physically. Usually, morning sickness has lessened or quit completely, you may have more energy, and you may have an increase in appetite. The second trimester is also a time when the fetus is growing rapidly. At the end of the sixth month, the fetus is about 9 inches long and weighs about 1 pounds. You will likely begin to feel the baby move (quickening) between 18 and 20 weeks of the pregnancy. BODY CHANGES Your body goes through many changes during pregnancy. The changes vary from woman to woman.   Your weight will continue to increase. You will notice your lower abdomen bulging out.  You may begin to get stretch marks on your hips, abdomen, and breasts.  You may develop headaches that can  be relieved by medicines approved by your health care provider.  You may urinate more often because the fetus is pressing on your bladder.  You may develop or continue to have heartburn as a result of your pregnancy.  You may develop constipation because certain hormones are causing the muscles that push waste through your intestines to slow down.  You may develop hemorrhoids or swollen, bulging veins (varicose veins).  You may have back pain because of the weight gain and pregnancy hormones relaxing your joints between the bones in your pelvis and as a result of a shift in weight and the muscles that support your balance.  Your breasts will continue to grow and be tender.  Your gums may bleed and may be sensitive to brushing and flossing.  Dark spots or blotches (chloasma, mask of pregnancy) may develop on your face. This will likely fade after the baby is born.  A dark line from your belly button to the pubic area (linea nigra) may appear. This will likely fade after the baby is born.  You may have changes in your hair. These can include thickening of your hair, rapid growth, and changes in texture. Some women also have hair loss during or after pregnancy, or hair that feels dry or thin. Your hair will most likely return to normal after your baby is born. WHAT TO EXPECT AT YOUR PRENATAL VISITS During a routine prenatal visit:  You will be weighed to make sure you and the fetus are growing normally.  Your blood pressure will be taken.  Your abdomen will be measured to track your baby's growth.  The fetal heartbeat  will be listened to.  Any test results from the previous visit will be discussed. Your health care provider may ask you:  How you are feeling.  If you are feeling the baby move.  If you have had any abnormal symptoms, such as leaking fluid, bleeding, severe headaches, or abdominal cramping.  If you are using any tobacco products, including cigarettes, chewing  tobacco, and electronic cigarettes.  If you have any questions. Other tests that may be performed during your second trimester include:  Blood tests that check for:  Low iron levels (anemia).  Gestational diabetes (between 24 and 28 weeks).  Rh antibodies.  Urine tests to check for infections, diabetes, or protein in the urine.  An ultrasound to confirm the proper growth and development of the baby.  An amniocentesis to check for possible genetic problems.  Fetal screens for spina bifida and Down syndrome.  HIV (human immunodeficiency virus) testing. Routine prenatal testing includes screening for HIV, unless you choose not to have this test. HOME CARE INSTRUCTIONS   Avoid all smoking, herbs, alcohol, and unprescribed drugs. These chemicals affect the formation and growth of the baby.  Do not use any tobacco products, including cigarettes, chewing tobacco, and electronic cigarettes. If you need help quitting, ask your health care provider. You may receive counseling support and other resources to help you quit.  Follow your health care provider's instructions regarding medicine use. There are medicines that are either safe or unsafe to take during pregnancy.  Exercise only as directed by your health care provider. Experiencing uterine cramps is a good sign to stop exercising.  Continue to eat regular, healthy meals.  Wear a good support bra for breast tenderness.  Do not use hot tubs, steam rooms, or saunas.  Wear your seat belt at all times when driving.  Avoid raw meat, uncooked cheese, cat litter boxes, and soil used by cats. These carry germs that can cause birth defects in the baby.  Take your prenatal vitamins.  Take 1500-2000 mg of calcium daily starting at the 20th week of pregnancy until you deliver your baby.  Try taking a stool softener (if your health care provider approves) if you develop constipation. Eat more high-fiber foods, such as fresh vegetables or  fruit and whole grains. Drink plenty of fluids to keep your urine clear or pale yellow.  Take warm sitz baths to soothe any pain or discomfort caused by hemorrhoids. Use hemorrhoid cream if your health care provider approves.  If you develop varicose veins, wear support hose. Elevate your feet for 15 minutes, 3-4 times a day. Limit salt in your diet.  Avoid heavy lifting, wear low heel shoes, and practice good posture.  Rest with your legs elevated if you have leg cramps or low back pain.  Visit your dentist if you have not gone yet during your pregnancy. Use a soft toothbrush to brush your teeth and be gentle when you floss.  A sexual relationship may be continued unless your health care provider directs you otherwise.  Continue to go to all your prenatal visits as directed by your health care provider. SEEK MEDICAL CARE IF:   You have dizziness.  You have mild pelvic cramps, pelvic pressure, or nagging pain in the abdominal area.  You have persistent nausea, vomiting, or diarrhea.  You have a bad smelling vaginal discharge.  You have pain with urination. SEEK IMMEDIATE MEDICAL CARE IF:   You have a fever.  You are leaking fluid from your vagina.  You have spotting or bleeding from your vagina.  You have severe abdominal cramping or pain.  You have rapid weight gain or loss.  You have shortness of breath with chest pain.  You notice sudden or extreme swelling of your face, hands, ankles, feet, or legs.  You have not felt your baby move in over an hour.  You have severe headaches that do not go away with medicine.  You have vision changes.   This information is not intended to replace advice given to you by your health care provider. Make sure you discuss any questions you have with your health care provider.   Document Released: 02/26/2001 Document Revised: 03/25/2014 Document Reviewed: 05/05/2012 Elsevier Interactive Patient Education [employer]  Third  Trimester of Pregnancy The third trimester is from week 29 through week 42, months 7 through 9. The third trimester is a time when the fetus is growing rapidly. At the end of the ninth month, the fetus is about 20 inches in length and weighs 6-10 pounds.  BODY CHANGES Your body goes through many changes during pregnancy. The changes vary from woman to woman.   Your weight will continue to increase. You can expect to gain 25-35 pounds (11-16 kg) by the end of the pregnancy.  You may begin to get stretch marks on your hips, abdomen, and breasts.  You may urinate more often because the fetus is moving lower into your pelvis and pressing on your bladder.  You may develop or continue to have heartburn as a result of your pregnancy.  You may develop constipation because certain hormones are causing the muscles that push waste through your intestines to slow down.  You may develop hemorrhoids or swollen, bulging veins (varicose veins).  You may have pelvic pain because of the weight gain and pregnancy hormones relaxing your joints between the bones in your pelvis. Backaches may result from overexertion of the muscles supporting your posture.  You may have changes in your hair. These can include thickening of your hair, rapid growth, and changes in texture. Some women also have hair loss during or after pregnancy, or hair that feels dry or thin. Your hair will most likely return to normal after your baby is born.  Your breasts will continue to grow and be tender. A yellow discharge may leak from your breasts called colostrum.  Your belly button may stick out.  You may feel short of breath because of your expanding uterus.  You may notice the fetus "dropping," or moving lower in your abdomen.  You may have a bloody mucus discharge. This usually occurs a few days to a week before labor begins.  Your cervix becomes thin and soft (effaced) near your due date. WHAT TO EXPECT AT YOUR PRENATAL EXAMS   You will have prenatal exams every 2 weeks until week 36. Then, you will have weekly prenatal exams. During a routine prenatal visit:  You will be weighed to make sure you and the fetus are growing normally.  Your blood pressure is taken.  Your abdomen will be measured to track your baby's growth.  The fetal heartbeat will be listened to.  Any test results from the previous visit will be discussed.  You may have a cervical check near your due date to see if you have effaced. At around 36 weeks, your caregiver will check your cervix. At the same time, your caregiver will also perform a test on the secretions of the vaginal tissue. This test is to  determine if a type of bacteria, Group B streptococcus, is present. Your caregiver will explain this further. Your caregiver may ask you:  What your birth plan is.  How you are feeling.  If you are feeling the baby move.  If you have had any abnormal symptoms, such as leaking fluid, bleeding, severe headaches, or abdominal cramping.  If you are using any tobacco products, including cigarettes, chewing tobacco, and electronic cigarettes.  If you have any questions. Other tests or screenings that may be performed during your third trimester include:  Blood tests that check for low iron levels (anemia).  Fetal testing to check the health, activity level, and growth of the fetus. Testing is done if you have certain medical conditions or if there are problems during the pregnancy.  HIV (human immunodeficiency virus) testing. If you are at high risk, you may be screened for HIV during your third trimester of pregnancy. FALSE LABOR You may feel small, irregular contractions that eventually go away. These are called Braxton Hicks contractions, or false labor. Contractions may last for hours, days, or even weeks before true labor sets in. If contractions come at regular intervals, intensify, or become painful, it is best to be seen by your  caregiver.  SIGNS OF LABOR   Menstrual-like cramps.  Contractions that are 5 minutes apart or less.  Contractions that start on the top of the uterus and spread down to the lower abdomen and back.  A sense of increased pelvic pressure or back pain.  A watery or bloody mucus discharge that comes from the vagina. If you have any of these signs before the 37th week of pregnancy, call your caregiver right away. You need to go to the hospital to get checked immediately. HOME CARE INSTRUCTIONS   Avoid all smoking, herbs, alcohol, and unprescribed drugs. These chemicals affect the formation and growth of the baby.  Do not use any tobacco products, including cigarettes, chewing tobacco, and electronic cigarettes. If you need help quitting, ask your health care provider. You may receive counseling support and other resources to help you quit.  Follow your caregiver's instructions regarding medicine use. There are medicines that are either safe or unsafe to take during pregnancy.  Exercise only as directed by your caregiver. Experiencing uterine cramps is a good sign to stop exercising.  Continue to eat regular, healthy meals.  Wear a good support bra for breast tenderness.  Do not use hot tubs, steam rooms, or saunas.  Wear your seat belt at all times when driving.  Avoid raw meat, uncooked cheese, cat litter boxes, and soil used by cats. These carry germs that can cause birth defects in the baby.  Take your prenatal vitamins.  Take 1500-2000 mg of calcium daily starting at the 20th week of pregnancy until you deliver your baby.  Try taking a stool softener (if your caregiver approves) if you develop constipation. Eat more high-fiber foods, such as fresh vegetables or fruit and whole grains. Drink plenty of fluids to keep your urine clear or pale yellow.  Take warm sitz baths to soothe any pain or discomfort caused by hemorrhoids. Use hemorrhoid cream if your caregiver approves.  If  you develop varicose veins, wear support hose. Elevate your feet for 15 minutes, 3-4 times a day. Limit salt in your diet.  Avoid heavy lifting, wear low heal shoes, and practice good posture.  Rest a lot with your legs elevated if you have leg cramps or low back pain.  Visit your  dentist if you have not gone during your pregnancy. Use a soft toothbrush to brush your teeth and be gentle when you floss.  A sexual relationship may be continued unless your caregiver directs you otherwise.  Do not travel far distances unless it is absolutely necessary and only with the approval of your caregiver.  Take prenatal classes to understand, practice, and ask questions about the labor and delivery.  Make a trial run to the hospital.  Pack your hospital bag.  Prepare the baby's nursery.  Continue to go to all your prenatal visits as directed by your caregiver. SEEK MEDICAL CARE IF:  You are unsure if you are in labor or if your water has broken.  You have dizziness.  You have mild pelvic cramps, pelvic pressure, or nagging pain in your abdominal area.  You have persistent nausea, vomiting, or diarrhea.  You have a bad smelling vaginal discharge.  You have pain with urination. SEEK IMMEDIATE MEDICAL CARE IF:   You have a fever.  You are leaking fluid from your vagina.  You have spotting or bleeding from your vagina.  You have severe abdominal cramping or pain.  You have rapid weight loss or gain.  You have shortness of breath with chest pain.  You notice sudden or extreme swelling of your face, hands, ankles, feet, or legs.  You have not felt your baby move in over an hour.  You have severe headaches that do not go away with medicine.  You have vision changes.   This information is not intended to replace advice given to you by your health care provider. Make sure you discuss any questions you have with your health care provider.   Document Released: 02/26/2001 Document  Revised: 03/25/2014 Document Reviewed: 05/05/2012 Elsevier Interactive Patient Education [employer]

## 2015-10-25 NOTE — Progress Notes (Signed)
Subjective:  [name] is a [age] y.o. G3P2002 at 321w1d being seen today for ongoing prenatal care.  She is currently monitored for the following issues for this low-risk pregnancy and has Low grade squamous intraepithelial lesion (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; Moderate dysplasia of cervix (CIN II); Supervision of normal pregnancy in first trimester; and History of pre-eclampsia in prior pregnancy, currently pregnant in first trimester on her problem list.  Patient reports occasional contractions.  Contractions: Irregular. Vag. Bleeding: None.  Movement: Present. Denies leaking of fluid.   The following portions of the patient's history were reviewed and updated as appropriate: allergies, current medications, past family history, past medical history, past social history, past surgical history and problem list. Problem list updated.  Objective:   Vitals:   10/25/15 1011  BP: 127/70  Pulse: 72  Weight: 153 lb 4.8 oz (69.5 kg)    Fetal Status: Fetal Heart Rate (bpm): 134 Fundal Height: 23 cm Movement: Present     General:  Alert, oriented and cooperative. Patient is in no acute distress.  Skin: Skin is warm and dry. No rash noted.   Cardiovascular: Normal heart rate noted  Respiratory: Normal respiratory effort, no problems with respiration noted  Abdomen: Soft, gravid, appropriate for gestational age. Pain/Pressure: Absent     Pelvic:  Cervical exam deferred        Extremities: Normal range of motion.  Edema: None  Mental Status: Normal mood and affect. Normal behavior. Normal judgment and thought content.   Urinalysis: Urine Protein: Negative Urine Glucose: Negative  Assessment and Plan:  Pregnancy: G3P2002 at [redacted]w[redacted]d  1. Supervision of normal pregnancy, second trimester - Discussed third trimester labs for next visit  2.  History of pre-eclampsia in prior pregnancy, currently pregnant in first trimester - Continue baby ASA  Preterm labor symptoms and general obstetric precautions  including but not limited to vaginal bleeding, contractions, leaking of fluid and fetal movement were reviewed in detail with the patient. Please refer to After Visit Summary for other counseling recommendations.  Return in 4 weeks (on 11/22/2015).   Eino FarberWalidah Kennith GainN Karim, CNM

## 2015-11-29 ENCOUNTER — Ambulatory Visit (INDEPENDENT_AMBULATORY_CARE_PROVIDER_SITE_OTHER): Payer: Medicaid Other | Admitting: Family

## 2015-11-29 VITALS — BP 119/68 | HR 90 | Wt 160.3 lb

## 2015-11-29 DIAGNOSIS — Z3492 Encounter for supervision of normal pregnancy, unspecified, second trimester: Secondary | ICD-10-CM

## 2015-11-29 DIAGNOSIS — O26899 Other specified pregnancy related conditions, unspecified trimester: Secondary | ICD-10-CM | POA: Diagnosis not present

## 2015-11-29 DIAGNOSIS — O09291 Supervision of pregnancy with other poor reproductive or obstetric history, first trimester: Secondary | ICD-10-CM

## 2015-11-29 DIAGNOSIS — O09292 Supervision of pregnancy with other poor reproductive or obstetric history, second trimester: Secondary | ICD-10-CM | POA: Diagnosis not present

## 2015-11-29 DIAGNOSIS — Z3491 Encounter for supervision of normal pregnancy, unspecified, first trimester: Secondary | ICD-10-CM

## 2015-11-29 DIAGNOSIS — Z23 Encounter for immunization: Secondary | ICD-10-CM | POA: Diagnosis not present

## 2015-11-29 DIAGNOSIS — Z3482 Encounter for supervision of other normal pregnancy, second trimester: Secondary | ICD-10-CM

## 2015-11-29 DIAGNOSIS — R102 Pelvic and perineal pain: Secondary | ICD-10-CM

## 2015-11-29 DIAGNOSIS — Z3493 Encounter for supervision of normal pregnancy, unspecified, third trimester: Secondary | ICD-10-CM

## 2015-11-29 LAB — CBC
HCT: 29.4 % — ABNORMAL LOW (ref 35.0–45.0)
Hemoglobin: 9.9 g/dL — ABNORMAL LOW (ref 11.7–15.5)
MCH: 27.8 pg (ref 27.0–33.0)
MCHC: 33.7 g/dL (ref 32.0–36.0)
MCV: 82.6 fL (ref 80.0–100.0)
MPV: 10.3 fL (ref 7.5–12.5)
Platelets: 190 10*3/uL (ref 140–400)
RBC: 3.56 MIL/uL — ABNORMAL LOW (ref 3.80–5.10)
RDW: 13.5 % (ref 11.0–15.0)
WBC: 8.3 10*3/uL (ref 3.8–10.8)

## 2015-11-29 LAB — POCT URINALYSIS DIP (DEVICE)
Bilirubin Urine: NEGATIVE
Glucose, UA: NEGATIVE mg/dL
Hgb urine dipstick: NEGATIVE
Ketones, ur: NEGATIVE mg/dL
Leukocytes, UA: NEGATIVE
Nitrite: NEGATIVE
Protein, ur: NEGATIVE mg/dL
Specific Gravity, Urine: 1.015 (ref 1.005–1.030)
Urobilinogen, UA: 1 mg/dL (ref 0.0–1.0)
pH: 6.5 (ref 5.0–8.0)

## 2015-11-29 MED ORDER — TETANUS-DIPHTH-ACELL PERTUSSIS 5-2.5-18.5 LF-MCG/0.5 IM SUSP
0.5000 mL | Freq: Once | INTRAMUSCULAR | Status: AC
  Administered 2015-11-29: 0.5 mL via INTRAMUSCULAR

## 2015-11-29 MED ORDER — MISC. DEVICES MISC: 0 refills | Status: DC

## 2015-11-29 MED ORDER — ASPIRIN 81 MG PO CHEW: 81.0000 mg | CHEWABLE_TABLET | Freq: Every day | ORAL | 2 refills | Status: DC

## 2015-11-29 NOTE — Progress Notes (Signed)
C/o contractions occasionally. Declines flu shot

## 2015-11-29 NOTE — Patient Instructions (Addendum)
Take Baby Aspirin 81 mg once a day  Mother-to-Be Maternity Abdominal Support [facility] [address] [place], [place]  [id number] [phone]  Third Trimester of Pregnancy The third trimester is from week 29 through week 42, months 7 through 9. The third trimester is a time when the fetus is growing rapidly. At the end of the ninth month, the fetus is about 20 inches in length and weighs 6-10 pounds.  BODY CHANGES Your body goes through many changes during pregnancy. The changes vary from woman to woman.   Your weight will continue to increase. You can expect to gain 25-35 pounds (11-16 kg) by the end of the pregnancy.  You may begin to get stretch marks on your hips, abdomen, and breasts.  You may urinate more often because the fetus is moving lower into your pelvis and pressing on your bladder.  You may develop or continue to have heartburn as a result of your pregnancy.  You may develop constipation because certain hormones are causing the muscles that push waste through your intestines to slow down.  You may develop hemorrhoids or swollen, bulging veins (varicose veins).  You may have pelvic pain because of the weight gain and pregnancy hormones relaxing your joints between the bones in your pelvis. Backaches may result from overexertion of the muscles supporting your posture.  You may have changes in your hair. These can include thickening of your hair, rapid growth, and changes in texture. Some women also have hair loss during or after pregnancy, or hair that feels dry or thin. Your hair will most likely return to normal after your baby is born.  Your breasts will continue to grow and be tender. A yellow discharge may leak from your breasts called colostrum.  Your belly button may stick out.  You may feel short of breath because of your expanding uterus.  You may notice the fetus "dropping," or moving lower in your abdomen.  You may have a bloody mucus  discharge. This usually occurs a few days to a week before labor begins.  Your cervix becomes thin and soft (effaced) near your due date. WHAT TO EXPECT AT YOUR PRENATAL EXAMS  You will have prenatal exams every 2 weeks until week 36. Then, you will have weekly prenatal exams. During a routine prenatal visit:  You will be weighed to make sure you and the fetus are growing normally.  Your blood pressure is taken.  Your abdomen will be measured to track your baby's growth.  The fetal heartbeat will be listened to.  Any test results from the previous visit will be discussed.  You may have a cervical check near your due date to see if you have effaced. At around 36 weeks, your caregiver will check your cervix. At the same time, your caregiver will also perform a test on the secretions of the vaginal tissue. This test is to determine if a type of bacteria, Group B streptococcus, is present. Your caregiver will explain this further. Your caregiver may ask you:  What your birth plan is.  How you are feeling.  If you are feeling the baby move.  If you have had any abnormal symptoms, such as leaking fluid, bleeding, severe headaches, or abdominal cramping.  If you are using any tobacco products, including cigarettes, chewing tobacco, and electronic cigarettes.  If you have any questions. Other tests or screenings that may be performed during your third trimester include:  Blood tests that check for low iron levels (  anemia).  Fetal testing to check the health, activity level, and growth of the fetus. Testing is done if you have certain medical conditions or if there are problems during the pregnancy.  HIV (human immunodeficiency virus) testing. If you are at high risk, you may be screened for HIV during your third trimester of pregnancy. FALSE LABOR You may feel small, irregular contractions that eventually go away. These are called Braxton Hicks contractions, or false labor.  Contractions may last for hours, days, or even weeks before true labor sets in. If contractions come at regular intervals, intensify, or become painful, it is best to be seen by your caregiver.  SIGNS OF LABOR   Menstrual-like cramps.  Contractions that are 5 minutes apart or less.  Contractions that start on the top of the uterus and spread down to the lower abdomen and back.  A sense of increased pelvic pressure or back pain.  A watery or bloody mucus discharge that comes from the vagina. If you have any of these signs before the 37th week of pregnancy, call your caregiver right away. You need to go to the hospital to get checked immediately. HOME CARE INSTRUCTIONS   Avoid all smoking, herbs, alcohol, and unprescribed drugs. These chemicals affect the formation and growth of the baby.  Do not use any tobacco products, including cigarettes, chewing tobacco, and electronic cigarettes. If you need help quitting, ask your health care provider. You may receive counseling support and other resources to help you quit.  Follow your caregiver's instructions regarding medicine use. There are medicines that are either safe or unsafe to take during pregnancy.  Exercise only as directed by your caregiver. Experiencing uterine cramps is a good sign to stop exercising.  Continue to eat regular, healthy meals.  Wear a good support bra for breast tenderness.  Do not use hot tubs, steam rooms, or saunas.  Wear your seat belt at all times when driving.  Avoid raw meat, uncooked cheese, cat litter boxes, and soil used by cats. These carry germs that can cause birth defects in the baby.  Take your prenatal vitamins.  Take 1500-2000 mg of calcium daily starting at the 20th week of pregnancy until you deliver your baby.  Try taking a stool softener (if your caregiver approves) if you develop constipation. Eat more high-fiber foods, such as fresh vegetables or fruit and whole grains. Drink plenty of  fluids to keep your urine clear or pale yellow.  Take warm sitz baths to soothe any pain or discomfort caused by hemorrhoids. Use hemorrhoid cream if your caregiver approves.  If you develop varicose veins, wear support hose. Elevate your feet for 15 minutes, 3-4 times a day. Limit salt in your diet.  Avoid heavy lifting, wear low heal shoes, and practice good posture.  Rest a lot with your legs elevated if you have leg cramps or low back pain.  Visit your dentist if you have not gone during your pregnancy. Use a soft toothbrush to brush your teeth and be gentle when you floss.  A sexual relationship may be continued unless your caregiver directs you otherwise.  Do not travel far distances unless it is absolutely necessary and only with the approval of your caregiver.  Take prenatal classes to understand, practice, and ask questions about the labor and delivery.  Make a trial run to the hospital.  Pack your hospital bag.  Prepare the baby's nursery.  Continue to go to all your prenatal visits as directed by your caregiver.  SEEK MEDICAL CARE IF:  You are unsure if you are in labor or if your water has broken.  You have dizziness.  You have mild pelvic cramps, pelvic pressure, or nagging pain in your abdominal area.  You have persistent nausea, vomiting, or diarrhea.  You have a bad smelling vaginal discharge.  You have pain with urination. SEEK IMMEDIATE MEDICAL CARE IF:   You have a fever.  You are leaking fluid from your vagina.  You have spotting or bleeding from your vagina.  You have severe abdominal cramping or pain.  You have rapid weight loss or gain.  You have shortness of breath with chest pain.  You notice sudden or extreme swelling of your face, hands, ankles, feet, or legs.  You have not felt your baby move in over an hour.  You have severe headaches that do not go away with medicine.  You have vision changes.   This information is not intended  to replace advice given to you by your health care provider. Make sure you discuss any questions you have with your health care provider.   Document Released: 02/26/2001 Document Revised: 03/25/2014 Document Reviewed: 05/05/2012 Elsevier Interactive Patient Education [employer]

## 2015-11-29 NOTE — Progress Notes (Signed)
   PRENATAL VISIT NOTE  Subjective:  [name] is a 25 y.o. G3P2002 at 2832w1d being seen today for ongoing prenatal care.  She is currently monitored for the following issues for this low-risk pregnancy and has Low grade squamous intraepithelial lesion (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; Moderate dysplasia of cervix (CIN II); Supervision of normal pregnancy in first trimester; History of pre-eclampsia in prior pregnancy, currently pregnant in first trimester; and Pelvic pain complicating pregnancy, antepartum on her problem list.  Patient reports intermittent groin pain on right side.  Contractions: Irregular. Vag. Bleeding: None.  Movement: Present. Denies leaking of fluid.   The following portions of the patient's history were reviewed and updated as appropriate: allergies, current medications, past family history, past medical history, past social history, past surgical history and problem list. Problem list updated.  Objective:   Vitals:   11/29/15 1329  BP: 119/68  Pulse: 90  Weight: 160 lb 4.8 oz (72.7 kg)    Fetal Status: Fetal Heart Rate (bpm): 142 Fundal Height: 28 cm Movement: Present     General:  Alert, oriented and cooperative. Patient is in no acute distress.  Skin: Skin is warm and dry. No rash noted.   Cardiovascular: Normal heart rate noted  Respiratory: Normal respiratory effort, no problems with respiration noted  Abdomen: Soft, gravid, appropriate for gestational age. Pain/Pressure: Present     Pelvic:  Cervical exam deferred        Extremities: Normal range of motion.  Edema: Trace  Mental Status: Normal mood and affect. Normal behavior. Normal judgment and thought content.   Urinalysis: Urine Protein: Negative Urine Glucose: Negative  Assessment and Plan:  Pregnancy: G3P2002 at 5732w1d  1. Supervision of normal pregnancy in second trimester - Glucose Tolerance, 1 HR (50g) - CBC - RPR - HIV antibody - Tdap (BOOSTRIX) injection 0.5 mL; Inject 0.5 mLs into the  muscle once.  2. History of pre-eclampsia in prior pregnancy, currently pregnant in first trimester - US MFM OB FOLLOW UP; Future - aspirin 81 MG chewable tablet; Chew 1 tablet (81 mg total) by mouth daily.  Dispense: 30 tablet; Refill: 2  3. Pelvic pain complicating pregnancy, antepartum - Misc. Devices MISC; Belly Band - Maternity Support  Dispense: 1 each; Refill: 0  Preterm labor symptoms and general obstetric precautions including but not limited to vaginal bleeding, contractions, leaking of fluid and fetal movement were reviewed in detail with the patient. Please refer to After Visit Summary for other counseling recommendations.  Return in 2 weeks (on 12/13/2015).  [name], CNM

## 2015-11-30 LAB — HIV ANTIBODY (ROUTINE TESTING W REFLEX): HIV 1&2 Ab, 4th Generation: NONREACTIVE

## 2015-11-30 LAB — RPR

## 2015-11-30 LAB — GLUCOSE TOLERANCE, 1 HOUR (50G) W/O FASTING: Glucose, 1 Hr, gestational: 100 mg/dL (ref <140)

## 2015-12-01 ENCOUNTER — Other Ambulatory Visit: Payer: Self-pay | Admitting: Family

## 2015-12-01 DIAGNOSIS — O99013 Anemia complicating pregnancy, third trimester: Secondary | ICD-10-CM

## 2015-12-01 DIAGNOSIS — O99019 Anemia complicating pregnancy, unspecified trimester: Secondary | ICD-10-CM | POA: Insufficient documentation

## 2015-12-01 MED ORDER — FERROUS SULFATE 325 (65 FE) MG PO TABS: 325.0000 mg | ORAL_TABLET | Freq: Two times a day (BID) | ORAL | 1 refills | Status: DC

## 2015-12-06 ENCOUNTER — Ambulatory Visit: Payer: Medicaid Other

## 2015-12-06 NOTE — Progress Notes (Unsigned)
   Patient left without being seen.  [name], Tamika L, RN

## 2015-12-19 ENCOUNTER — Ambulatory Visit (HOSPITAL_COMMUNITY)
Admission: RE | Admit: 2015-12-19 | Discharge: 2015-12-19 | Disposition: A | Discharge status: Home / Self Care | Payer: Medicaid Other | Source: Ambulatory Visit | Attending: Family | Admitting: Family

## 2015-12-19 DIAGNOSIS — Z3A30 30 weeks gestation of pregnancy: Secondary | ICD-10-CM | POA: Insufficient documentation

## 2015-12-19 DIAGNOSIS — O09293 Supervision of pregnancy with other poor reproductive or obstetric history, third trimester: Secondary | ICD-10-CM | POA: Diagnosis not present

## 2015-12-19 DIAGNOSIS — O09291 Supervision of pregnancy with other poor reproductive or obstetric history, first trimester: Secondary | ICD-10-CM

## 2015-12-19 DIAGNOSIS — O10013 Pre-existing essential hypertension complicating pregnancy, third trimester: Secondary | ICD-10-CM | POA: Diagnosis present

## 2015-12-27 ENCOUNTER — Ambulatory Visit (INDEPENDENT_AMBULATORY_CARE_PROVIDER_SITE_OTHER): Payer: Medicaid Other | Admitting: Family

## 2015-12-27 VITALS — BP 129/76 | HR 78 | Wt 163.6 lb

## 2015-12-27 DIAGNOSIS — Z3483 Encounter for supervision of other normal pregnancy, third trimester: Secondary | ICD-10-CM

## 2015-12-27 DIAGNOSIS — O09293 Supervision of pregnancy with other poor reproductive or obstetric history, third trimester: Secondary | ICD-10-CM

## 2015-12-27 DIAGNOSIS — Z3481 Encounter for supervision of other normal pregnancy, first trimester: Secondary | ICD-10-CM

## 2015-12-27 DIAGNOSIS — O09291 Supervision of pregnancy with other poor reproductive or obstetric history, first trimester: Secondary | ICD-10-CM

## 2015-12-27 DIAGNOSIS — D649 Anemia, unspecified: Secondary | ICD-10-CM

## 2015-12-27 DIAGNOSIS — O99019 Anemia complicating pregnancy, unspecified trimester: Secondary | ICD-10-CM

## 2015-12-27 DIAGNOSIS — O99013 Anemia complicating pregnancy, third trimester: Secondary | ICD-10-CM

## 2015-12-27 NOTE — Progress Notes (Signed)
   PRENATAL VISIT NOTE  Subjective:  [name] is a 25 y.o. G3P2002 at 6835w1d being seen today for ongoing prenatal care.  She is currently monitored for the following issues for this low-risk pregnancy and has Low grade squamous intraepithelial lesion (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; Moderate dysplasia of cervix (CIN II); Supervision of normal pregnancy in first trimester; History of pre-eclampsia in prior pregnancy, currently pregnant in first trimester; Pelvic pain complicating pregnancy, antepartum; and Anemia of mother in pregnancy, antepartum on her problem list.  Patient reports no complaints.  Contractions: Irritability. Vag. Bleeding: None.  Movement: Present. Denies leaking of fluid.   The following portions of the patient's history were reviewed and updated as appropriate: allergies, current medications, past family history, past medical history, past social history, past surgical history and problem list. Problem list updated.  Objective:   Vitals:   12/27/15 0942  BP: 129/76  Pulse: 78  Weight: 163 lb 9.6 oz (74.2 kg)    Fetal Status: Fetal Heart Rate (bpm): 145 Fundal Height: 32 cm Movement: Present     General:  Alert, oriented and cooperative. Patient is in no acute distress.  Skin: Skin is warm and dry. No rash noted.   Cardiovascular: Normal heart rate noted  Respiratory: Normal respiratory effort, no problems with respiration noted  Abdomen: Soft, gravid, appropriate for gestational age. Pain/Pressure: Present     Pelvic:  Cervical exam deferred        Extremities: Normal range of motion.  Edema: Trace  Mental Status: Normal mood and affect. Normal behavior. Normal judgment and thought content.   Urinalysis:      Assessment and Plan:  Pregnancy: G3P2002 at 5835w1d  1. Encounter for supervision of other normal pregnancy in third trimester - Reviewed third trimester labs  2.  History of pre-eclampsia in prior pregnancy, currently pregnant in first trimester -  Continue ASA  3. Anemia of mother in pregnancy, antepartum - Continue ferrous sulfate  Preterm labor symptoms and general obstetric precautions including but not limited to vaginal bleeding, contractions, leaking of fluid and fetal movement were reviewed in detail with the patient. Please refer to After Visit Summary for other counseling recommendations.  Return in 2 weeks (on 01/10/2016).  Eino FarberWalidah Kennith GainN Karim, CNM

## 2016-01-03 ENCOUNTER — Encounter: Payer: Medicaid Other | Admitting: Advanced Practice Midwife

## 2016-01-10 ENCOUNTER — Ambulatory Visit (INDEPENDENT_AMBULATORY_CARE_PROVIDER_SITE_OTHER): Payer: Medicaid Other | Admitting: Advanced Practice Midwife

## 2016-01-10 VITALS — BP 124/73 | HR 79 | Wt 165.7 lb

## 2016-01-10 DIAGNOSIS — Z3483 Encounter for supervision of other normal pregnancy, third trimester: Secondary | ICD-10-CM

## 2016-01-10 DIAGNOSIS — O479 False labor, unspecified: Secondary | ICD-10-CM

## 2016-01-10 DIAGNOSIS — O4703 False labor before 37 completed weeks of gestation, third trimester: Secondary | ICD-10-CM

## 2016-01-10 NOTE — Patient Instructions (Signed)

## 2016-01-10 NOTE — Progress Notes (Signed)
   PRENATAL VISIT NOTE  Subjective:  [name] is a 25 y.o. G3P2002 at 4651w1d being seen today for ongoing prenatal care.  She is currently monitored for the following issues for this low-risk pregnancy and has Low grade squamous intraepithelial lesion (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; Moderate dysplasia of cervix (CIN II); Supervision of normal pregnancy in first trimester; History of pre-eclampsia in prior pregnancy, currently pregnant in first trimester; Pelvic pain complicating pregnancy, antepartum; and Anemia of mother in pregnancy, antepartum on her problem list.  Patient reports contractions up to 3-4x/hour, associated with back pain and menstrual-like pain..  Contractions: Irregular. Vag. Bleeding: None.  Movement: Present. Denies leaking of fluid.   The following portions of the patient's history were reviewed and updated as appropriate: allergies, current medications, past family history, past medical history, past social history, past surgical history and problem list. Problem list updated.  Objective:   Vitals:   01/10/16 0910  BP: 124/73  Pulse: 79  Weight: 165 lb 11.2 oz (75.2 kg)    Fetal Status: Fetal Heart Rate (bpm): 144 Fundal Height: 33 cm Movement: Present     General:  Alert, oriented and cooperative. Patient is in no acute distress.  Skin: Skin is warm and dry. No rash noted.   Cardiovascular: Normal heart rate noted  Respiratory: Normal respiratory effort, no problems with respiration noted  Abdomen: Soft, gravid, appropriate for gestational age. Pain/Pressure: Present     Pelvic:  contractions x 2 days sometimes 3-4x/hour. Back pain and menstrual-like cramping associated with contractions. Dilation: Closed Effacement (%): 50 Station: Ballotable  Extremities: Normal range of motion.  Edema: Trace  Mental Status: Normal mood and affect. Normal behavior. Normal judgment and thought content.   Assessment and Plan:  Pregnancy: G3P2002 at 2351w1d  1. Encounter for  supervision of other normal pregnancy in third trimester   2. Braxton Hicks contractions --No evidence of preterm labor today, precautions reviewed/reasons to return to hospital.  Pt to leave urine sample today so U/A pending.  Preterm labor symptoms and general obstetric precautions including but not limited to vaginal bleeding, contractions, leaking of fluid and fetal movement were reviewed in detail with the patient. Please refer to After Visit Summary for other counseling recommendations.  Return in about 2 weeks (around 01/24/2016).  Hurshel PartyLisa A Leftwich-Kirby, CNM

## 2016-01-10 NOTE — Progress Notes (Signed)
Pt reports having contractions frequently; requests cervical check.

## 2016-01-14 ENCOUNTER — Encounter (HOSPITAL_COMMUNITY): Payer: Self-pay | Admitting: *Deleted

## 2016-01-14 ENCOUNTER — Inpatient Hospital Stay (HOSPITAL_COMMUNITY)
Admission: AD | Admit: 2016-01-14 | Discharge: 2016-01-14 | Disposition: A | Discharge status: Home / Self Care | Payer: Medicaid Other | Source: Ambulatory Visit | Attending: Obstetrics & Gynecology | Admitting: Obstetrics & Gynecology

## 2016-01-14 DIAGNOSIS — Z3A33 33 weeks gestation of pregnancy: Secondary | ICD-10-CM | POA: Insufficient documentation

## 2016-01-14 DIAGNOSIS — R51 Headache: Secondary | ICD-10-CM | POA: Insufficient documentation

## 2016-01-14 DIAGNOSIS — O9989 Other specified diseases and conditions complicating pregnancy, childbirth and the puerperium: Secondary | ICD-10-CM | POA: Diagnosis not present

## 2016-01-14 DIAGNOSIS — Z7982 Long term (current) use of aspirin: Secondary | ICD-10-CM | POA: Diagnosis not present

## 2016-01-14 DIAGNOSIS — O09291 Supervision of pregnancy with other poor reproductive or obstetric history, first trimester: Secondary | ICD-10-CM

## 2016-01-14 DIAGNOSIS — R0789 Other chest pain: Secondary | ICD-10-CM | POA: Insufficient documentation

## 2016-01-14 DIAGNOSIS — O26893 Other specified pregnancy related conditions, third trimester: Secondary | ICD-10-CM | POA: Diagnosis not present

## 2016-01-14 DIAGNOSIS — Z3481 Encounter for supervision of other normal pregnancy, first trimester: Secondary | ICD-10-CM

## 2016-01-14 DIAGNOSIS — R079 Chest pain, unspecified: Secondary | ICD-10-CM | POA: Diagnosis present

## 2016-01-14 DIAGNOSIS — O99019 Anemia complicating pregnancy, unspecified trimester: Secondary | ICD-10-CM

## 2016-01-14 DIAGNOSIS — Z87410 Personal history of cervical dysplasia: Secondary | ICD-10-CM | POA: Insufficient documentation

## 2016-01-14 LAB — CBC
HCT: 27.6 % — ABNORMAL LOW (ref 36.0–46.0)
Hemoglobin: 9.3 g/dL — ABNORMAL LOW (ref 12.0–15.0)
MCH: 27.2 pg (ref 26.0–34.0)
MCHC: 33.7 g/dL (ref 30.0–36.0)
MCV: 80.7 fL (ref 78.0–100.0)
Platelets: 172 10*3/uL (ref 150–400)
RBC: 3.42 MIL/uL — ABNORMAL LOW (ref 3.87–5.11)
RDW: 14 % (ref 11.5–15.5)
WBC: 7.7 10*3/uL (ref 4.0–10.5)

## 2016-01-14 LAB — COMPREHENSIVE METABOLIC PANEL
ALT: 10 U/L — ABNORMAL LOW (ref 14–54)
AST: 18 U/L (ref 15–41)
Albumin: 2.7 g/dL — ABNORMAL LOW (ref 3.5–5.0)
Alkaline Phosphatase: 82 U/L (ref 38–126)
Anion gap: 7 (ref 5–15)
BUN: <5 mg/dL — ABNORMAL LOW (ref 6–20)
CO2: 22 mmol/L (ref 22–32)
Calcium: 9.1 mg/dL (ref 8.9–10.3)
Chloride: 106 mmol/L (ref 101–111)
Creatinine, Ser: 0.5 mg/dL (ref 0.44–1.00)
GFR calc Af Amer: >60 mL/min (ref >60)
GFR calc non Af Amer: >60 mL/min (ref >60)
Glucose, Bld: 108 mg/dL — ABNORMAL HIGH (ref 65–99)
Potassium: 3.5 mmol/L (ref 3.5–5.1)
Sodium: 135 mmol/L (ref 135–145)
Total Bilirubin: 0.6 mg/dL (ref 0.3–1.2)
Total Protein: 6.5 g/dL (ref 6.5–8.1)

## 2016-01-14 LAB — PROTEIN / CREATININE RATIO, URINE
Creatinine, Urine: 82 mg/dL
Protein Creatinine Ratio: 0.23 mg/mg{Cre} — ABNORMAL HIGH (ref 0.00–0.15)
Total Protein, Urine: 19 mg/dL

## 2016-01-14 MED ORDER — GI COCKTAIL ~~LOC~~
30.0000 mL | Freq: Once | ORAL | Status: AC
  Administered 2016-01-14: 30 mL via ORAL
  Filled 2016-01-14: qty 30

## 2016-01-14 MED ORDER — ACETAMINOPHEN 500 MG PO TABS
1000.0000 mg | ORAL_TABLET | Freq: Once | ORAL | Status: AC
  Administered 2016-01-14: 1000 mg via ORAL
  Filled 2016-01-14: qty 2

## 2016-01-14 NOTE — MAU Note (Signed)
Chest pain started today chest pain that radiates down left arm

## 2016-01-14 NOTE — MAU Provider Note (Signed)
History     CSN: [id number]  Arrival date and time: 01/14/16 1737   First Provider Initiated Contact with Patient 01/14/16 1826      Chief Complaint  Patient presents with  . Chest Pain   HPI   [name] is a 25 y.o. G3P2002 at [redacted]w[redacted]d who presents with chest pain. Symptoms began a few hours prior to arrival. Describes constant sharp pain in her left upper chest. Rates pain 10/10. Pain radiates halfway down her left humerus. Has not treated pain. Denies SOB, palpitations, heartburn, or n/v.  Also reports headache this evening. Frontal dull headache that is constant. Rates pain 7/10. Has not treated. Nothing makes better or worse. Denies vision changes or epigastric pain.  Denies abdominal pain, vaginal bleeding, or LOF. Positive fetal movement.   OB History    Gravida Para Term Preterm AB Living   3 2 2  0 0 2   SAB TAB Ectopic Multiple Live Births   0 0 0 0 2      Past Medical History:  Diagnosis Date  . Anemia   . CIN II (cervical intraepithelial neoplasia II)   . Heart murmur   . History of gestational hypertension   . Scoliosis   . Vaginal Pap smear, abnormal     Past Surgical History:  Procedure Laterality Date  . LEEP  07/14/14   CIN II with negative margins    Family History  Problem Relation Age of Onset  . Anemia Mother   . Migraines Mother   . Mitral valve prolapse Sister   . Heart murmur Brother   . Heart attack Maternal Grandmother   . Mitral valve prolapse Maternal Grandmother   . Heart disease Maternal Grandmother   . Diabetes Maternal Grandmother   . Hyperlipidemia Maternal Grandmother   . Hypertension Maternal Grandmother     Social History  Substance Use Topics  . Smoking status: Never Smoker  . Smokeless tobacco: Never Used  . Alcohol use No    Allergies: No Known Allergies  Prescriptions Prior to Admission  Medication Sig Dispense Refill Last Dose  . aspirin 81 MG chewable tablet Chew 1 tablet (81 mg total) by mouth daily. 30  tablet 2 Taking  . ferrous sulfate (FERROUSUL) 325 (65 FE) MG tablet Take 1 tablet (325 mg total) by mouth 2 (two) times daily. (Patient not taking: Reported on 12/27/2015) 60 tablet 1 Not Taking  . Misc. Devices MISC Belly Band - Maternity Support (Patient not taking: Reported on 12/27/2015) 1 each 0 Not Taking  . Prenatal Vit-Fe Fumarate-FA (PRENATAL MULTIVITAMIN) TABS tablet Take 1 tablet by mouth daily at 12 noon.   Taking    Review of Systems  Constitutional: Negative.  Negative for chills, diaphoresis and fever.  Eyes: Negative for blurred vision and photophobia.  Respiratory: Negative.   Cardiovascular: Positive for chest pain and leg swelling. Negative for palpitations.  Gastrointestinal: Negative.   Genitourinary: Negative.   Neurological: Positive for headaches. Negative for dizziness and weakness.   Physical Exam   Blood pressure 127/78, pulse 86, temperature 98.7 F (37.1 C), temperature source Oral, resp. rate 18, height 5\' 7"  (1.702 m), last menstrual period 06/11/2015, SpO2 100 %.  Temp:  [98.7 F (37.1 C)] 98.7 F (37.1 C) (10/29 1743) Pulse Rate:  [79-91] 89 (10/29 1934) Resp:  [18] 18 (10/29 1743) BP: (114-157)/(70-85) 124/71 (10/29 1934) SpO2:  [100 %] 100 % (10/29 1834)   Physical Exam  Nursing note and vitals reviewed. Constitutional: She is oriented  to person, place, and time. She appears well-developed and well-nourished. No distress.  HENT:  Head: Normocephalic and atraumatic.  Eyes: Conjunctivae are normal. Right eye exhibits no discharge. Left eye exhibits no discharge. No scleral icterus.  Neck: Normal range of motion.  Cardiovascular: Normal rate, regular rhythm and normal heart sounds.   No murmur heard. Respiratory: Effort normal and breath sounds normal. No respiratory distress. She has no wheezes. She exhibits no tenderness.  GI: Soft. There is no tenderness.  Musculoskeletal: Normal range of motion. She exhibits edema (mild edeme of BLE, no  pitting).  Neurological: She is alert and oriented to person, place, and time. She has normal reflexes.  No clonus  Skin: Skin is warm and dry. She is not diaphoretic.  Psychiatric: She has a normal mood and affect. Her behavior is normal. Judgment and thought content normal.   Dilation: Closed Effacement (%): Thick Cervical Position: Posterior Exam by:: Judeth HornErin Lawrence NP  Fetal Tracing:  Baseline:135 Variability: moderate Accelerations: 15x15 Decelerations: none  Toco:  Irregular ctx; pt does not feel   MAU Course  Procedures Results for orders placed or performed during the hospital encounter of 01/14/16 (from the past 24 hour(s))  CBC     Status: Abnormal   Collection Time: 01/14/16  6:37 PM  Result Value Ref Range   WBC 7.7 4.0 - 10.5 K/uL   RBC 3.42 (L) 3.87 - 5.11 MIL/uL   Hemoglobin 9.3 (L) 12.0 - 15.0 g/dL   HCT 16.127.6 (L) 09.636.0 - 04.546.0 %   MCV 80.7 78.0 - 100.0 fL   MCH 27.2 26.0 - 34.0 pg   MCHC 33.7 30.0 - 36.0 g/dL   RDW 40.914.0 81.111.5 - 91.415.5 %   Platelets 172 150 - 400 K/uL  Comprehensive metabolic panel     Status: Abnormal   Collection Time: 01/14/16  6:37 PM  Result Value Ref Range   Sodium 135 135 - 145 mmol/L   Potassium 3.5 3.5 - 5.1 mmol/L   Chloride 106 101 - 111 mmol/L   CO2 22 22 - 32 mmol/L   Glucose, Bld 108 (H) 65 - 99 mg/dL   BUN <5 (L) 6 - 20 mg/dL   Creatinine, Ser 7.820.50 0.44 - 1.00 mg/dL   Calcium 9.1 8.9 - 95.610.3 mg/dL   Total Protein 6.5 6.5 - 8.1 g/dL   Albumin 2.7 (L) 3.5 - 5.0 g/dL   AST 18 15 - 41 U/L   ALT 10 (L) 14 - 54 U/L   Alkaline Phosphatase 82 38 - 126 U/L   Total Bilirubin 0.6 0.3 - 1.2 mg/dL   GFR calc non Af Amer >60 >60 mL/min   GFR calc Af Amer >60 >60 mL/min   Anion gap 7 5 - 15  Protein / creatinine ratio, urine     Status: Abnormal   Collection Time: 01/14/16  6:40 PM  Result Value Ref Range   Creatinine, Urine 82.00 mg/dL   Total Protein, Urine 19 mg/dL   Protein Creatinine Ratio 0.23 (H) 0.00 - 0.15 mg/mg[Cre]     MDM Reactive fetal tracing EKG -- normal sinus rhythm First BP elevated, all other BPs normotensive CBC, CMP, urine PCR -- PIH labs WNL Tylenol 1 gm PO -- headache decreased to 1/10 GI cocktail Cervix closed  Care turned [facility] to [facility]im CN[facility]      Judeth HornErin Lawrence, NP 01/14/2016 8:05 PM   2030 Assumed care of patient from Judeth HornErin Lawrence; pt denies abdominal pain or vaginal bleeding.  Pain  has decreased.  Able to reproduce pain on chest with palpation.  Assessment and Plan  25 y.o. G3P2002 at 8295w5d IUP  Chest Wall Pain Reactive NST  Plan: Discharge patient Recommended Tylenol prn for pain Preterm labor precautions Keep scheduled appointment  Marlis EdelsonWalidah N Karim, CNM

## 2016-01-14 NOTE — MAU Note (Signed)
RT at bedside for EKG.

## 2016-01-24 ENCOUNTER — Ambulatory Visit (INDEPENDENT_AMBULATORY_CARE_PROVIDER_SITE_OTHER): Payer: Medicaid Other | Admitting: Obstetrics and Gynecology

## 2016-01-24 ENCOUNTER — Other Ambulatory Visit (HOSPITAL_COMMUNITY)
Admission: RE | Admit: 2016-01-24 | Discharge: 2016-01-24 | Disposition: A | Discharge status: Home / Self Care | Payer: Medicaid Other | Source: Ambulatory Visit | Attending: Obstetrics and Gynecology | Admitting: Obstetrics and Gynecology

## 2016-01-24 VITALS — BP 120/66 | HR 79 | Wt 166.1 lb

## 2016-01-24 DIAGNOSIS — O09293 Supervision of pregnancy with other poor reproductive or obstetric history, third trimester: Secondary | ICD-10-CM

## 2016-01-24 DIAGNOSIS — Z3483 Encounter for supervision of other normal pregnancy, third trimester: Secondary | ICD-10-CM

## 2016-01-24 DIAGNOSIS — Z113 Encounter for screening for infections with a predominantly sexual mode of transmission: Secondary | ICD-10-CM

## 2016-01-24 DIAGNOSIS — Z3493 Encounter for supervision of normal pregnancy, unspecified, third trimester: Secondary | ICD-10-CM | POA: Diagnosis present

## 2016-01-24 LAB — POCT URINALYSIS DIP (DEVICE)
Bilirubin Urine: NEGATIVE
Glucose, UA: NEGATIVE mg/dL
Hgb urine dipstick: NEGATIVE
Ketones, ur: NEGATIVE mg/dL
Nitrite: NEGATIVE
Protein, ur: NEGATIVE mg/dL
Specific Gravity, Urine: 1.01 (ref 1.005–1.030)
Urobilinogen, UA: 2 mg/dL — ABNORMAL HIGH (ref 0.0–1.0)
pH: 7 (ref 5.0–8.0)

## 2016-01-24 NOTE — Progress Notes (Signed)
   PRENATAL VISIT NOTE  Subjective:  [name] is a 25 y.o. G3P2002 at 157w1d being seen today for ongoing prenatal care.  She is currently monitored for the following issues for this low-risk pregnancy and has Low grade squamous intraepithelial lesion (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; Moderate dysplasia of cervix (CIN II); Supervision of normal pregnancy in third trimester; History of pre-eclampsia in prior pregnancy, currently pregnant in third trimester; Pelvic pain complicating pregnancy, antepartum; and Anemia of mother in pregnancy, antepartum on her problem list.  Patient reports patient complainin gof some acne on her back, but otherwise no complaints..  Contractions: Irregular.  .  Movement: Present. Denies leaking of fluid.   The following portions of the patient's history were reviewed and updated as appropriate: allergies, current medications, past family history, past medical history, past social history, past surgical history and problem list. Problem list updated.  Objective:   Vitals:   01/24/16 0828  BP: 120/66  Pulse: 79  Weight: 166 lb 1.6 oz (75.3 kg)    Fetal Status: Fetal Heart Rate (bpm): 152   Movement: Present  Presentation: Vertex  General:  Alert, oriented and cooperative. Patient is in no acute distress.  Skin: Skin is warm and dry. No rash noted.   Cardiovascular: Normal heart rate noted  Respiratory: Normal respiratory effort, no problems with respiration noted  Abdomen: Soft, gravid, appropriate for gestational age. Pain/Pressure: Present     Pelvic:  Cervical exam deferred Dilation: 1 Effacement (%): 50    Extremities: Normal range of motion.  Edema: Trace  Mental Status: Normal mood and affect. Normal behavior. Normal judgment and thought content.   Assessment and Plan:  Pregnancy: G3P2002 at 2357w1d  1. Supervision of low-risk pregnancy third trimester - doing well. Routine care - Culture, beta strep (group b only) - GC/Chlamydia probe amp ([facility])not [facility]    3. History of pre-eclampsia in prior pregnancy, currently pregnant in third trimester -BP reviewed and wnl today.  -continue baby asa -spot check urine dip today to monitor for protein.  Preterm labor symptoms and general obstetric precautions including but not limited to vaginal bleeding, contractions, leaking of fluid and fetal movement were reviewed in detail with the patient. Please refer to After Visit Summary for other counseling recommendations.  Return in about 1 week (around 01/31/2016) for LOB.   Lorne SkeensNicholas Michael Schenk, MD

## 2016-01-24 NOTE — Progress Notes (Signed)
36 wk cultures today   

## 2016-01-24 NOTE — Patient Instructions (Signed)

## 2016-01-25 LAB — GC/CHLAMYDIA PROBE AMP (~~LOC~~) NOT AT ARMC
Chlamydia: NEGATIVE
Neisseria Gonorrhea: NEGATIVE

## 2016-01-26 LAB — CULTURE, BETA STREP (GROUP B ONLY)

## 2016-01-31 ENCOUNTER — Ambulatory Visit (INDEPENDENT_AMBULATORY_CARE_PROVIDER_SITE_OTHER): Payer: Medicaid Other | Admitting: Obstetrics & Gynecology

## 2016-01-31 VITALS — BP 114/70 | HR 73 | Wt 167.6 lb

## 2016-01-31 DIAGNOSIS — Z3483 Encounter for supervision of other normal pregnancy, third trimester: Secondary | ICD-10-CM

## 2016-01-31 DIAGNOSIS — O09293 Supervision of pregnancy with other poor reproductive or obstetric history, third trimester: Secondary | ICD-10-CM

## 2016-01-31 NOTE — Progress Notes (Signed)
   PRENATAL VISIT NOTE  Subjective:  [name] is a 25 y.o. G3P2002 at 932w1d being seen today for ongoing prenatal care.  She is currently monitored for the following issues for this high-risk pregnancy and has Low grade squamous intraepithelial lesion (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; Moderate dysplasia of cervix (CIN II); Supervision of normal pregnancy in third trimester; History of pre-eclampsia in prior pregnancy, currently pregnant in third trimester; Pelvic pain complicating pregnancy, antepartum; and Anemia of mother in pregnancy, antepartum on her problem list.  Patient reports occasional contractions and upper abdominal and right side pain this AM.  Contractions: Irregular. Vag. Bleeding: None.  Movement: Present. Denies leaking of fluid.   The following portions of the patient's history were reviewed and updated as appropriate: allergies, current medications, past family history, past medical history, past social history, past surgical history and problem list. Problem list updated.  Objective:   Vitals:   01/31/16 1031  BP: 114/70  Pulse: 73  Weight: 167 lb 9.6 oz (76 kg)    Fetal Status: Fetal Heart Rate (bpm): 145   Movement: Present     General:  Alert, oriented and cooperative. Patient is in no acute distress.  Skin: Skin is warm and dry. No rash noted.   Cardiovascular: Normal heart rate noted  Respiratory: Normal respiratory effort, no problems with respiration noted  Abdomen: Soft, gravid, appropriate for gestational age. Pain/Pressure: Present     Pelvic:  Cervical exam deferred        Extremities: Normal range of motion.  Edema: Trace  Mental Status: Normal mood and affect. Normal behavior. Normal judgment and thought content.   Assessment and Plan:  Pregnancy: G3P2002 at 2532w1d  1. Encounter for supervision of other normal pregnancy in third trimester Normal BP  2. History of pre-eclampsia in prior pregnancy, currently pregnant in third trimester Continue  ASA  Preterm labor symptoms and general obstetric precautions including but not limited to vaginal bleeding, contractions, leaking of fluid and fetal movement were reviewed in detail with the patient. Please refer to After Visit Summary for other counseling recommendations.  Return in about 1 week (around 02/07/2016).   Adam PhenixJames G Ronee Ranganathan, MD

## 2016-02-07 ENCOUNTER — Inpatient Hospital Stay (HOSPITAL_COMMUNITY)
Admission: EM | Admit: 2016-02-07 | Discharge: 2016-02-11 | DRG: 774 | Disposition: A | Discharge status: Home / Self Care | Payer: No Typology Code available for payment source | Attending: Obstetrics & Gynecology | Admitting: Obstetrics & Gynecology

## 2016-02-07 ENCOUNTER — Encounter (HOSPITAL_COMMUNITY): Payer: Self-pay | Admitting: Emergency Medicine

## 2016-02-07 ENCOUNTER — Encounter: Payer: Medicaid Other | Admitting: Obstetrics and Gynecology

## 2016-02-07 DIAGNOSIS — O9912 Other diseases of the blood and blood-forming organs and certain disorders involving the immune mechanism complicating childbirth: Secondary | ICD-10-CM | POA: Diagnosis present

## 2016-02-07 DIAGNOSIS — O26893 Other specified pregnancy related conditions, third trimester: Secondary | ICD-10-CM | POA: Diagnosis present

## 2016-02-07 DIAGNOSIS — O10919 Unspecified pre-existing hypertension complicating pregnancy, unspecified trimester: Secondary | ICD-10-CM

## 2016-02-07 DIAGNOSIS — D6959 Other secondary thrombocytopenia: Secondary | ICD-10-CM | POA: Diagnosis present

## 2016-02-07 DIAGNOSIS — D696 Thrombocytopenia, unspecified: Secondary | ICD-10-CM | POA: Diagnosis present

## 2016-02-07 DIAGNOSIS — Z3A37 37 weeks gestation of pregnancy: Secondary | ICD-10-CM | POA: Diagnosis not present

## 2016-02-07 DIAGNOSIS — M419 Scoliosis, unspecified: Secondary | ICD-10-CM | POA: Diagnosis present

## 2016-02-07 DIAGNOSIS — O4700 False labor before 37 completed weeks of gestation, unspecified trimester: Secondary | ICD-10-CM

## 2016-02-07 DIAGNOSIS — O368130 Decreased fetal movements, third trimester, not applicable or unspecified: Secondary | ICD-10-CM | POA: Diagnosis present

## 2016-02-07 DIAGNOSIS — O99119 Other diseases of the blood and blood-forming organs and certain disorders involving the immune mechanism complicating pregnancy, unspecified trimester: Secondary | ICD-10-CM

## 2016-02-07 DIAGNOSIS — O4593 Premature separation of placenta, unspecified, third trimester: Principal | ICD-10-CM | POA: Diagnosis present

## 2016-02-07 DIAGNOSIS — O479 False labor, unspecified: Secondary | ICD-10-CM | POA: Diagnosis present

## 2016-02-07 DIAGNOSIS — O9A213 Injury, poisoning and certain other consequences of external causes complicating pregnancy, third trimester: Secondary | ICD-10-CM

## 2016-02-07 LAB — CBC
HCT: 26.8 % — ABNORMAL LOW (ref 36.0–46.0)
HCT: 26.5 % — ABNORMAL LOW (ref 36.0–46.0)
HCT: 25.9 % — ABNORMAL LOW (ref 36.0–46.0)
Hemoglobin: 9.1 g/dL — ABNORMAL LOW (ref 12.0–15.0)
Hemoglobin: 8.8 g/dL — ABNORMAL LOW (ref 12.0–15.0)
Hemoglobin: 8.6 g/dL — ABNORMAL LOW (ref 12.0–15.0)
MCH: 27.1 pg (ref 26.0–34.0)
MCH: 26.6 pg (ref 26.0–34.0)
MCH: 26.7 pg (ref 26.0–34.0)
MCHC: 34 g/dL (ref 30.0–36.0)
MCHC: 33.2 g/dL (ref 30.0–36.0)
MCHC: 33.2 g/dL (ref 30.0–36.0)
MCV: 79.8 fL (ref 78.0–100.0)
MCV: 80.1 fL (ref 78.0–100.0)
MCV: 80.4 fL (ref 78.0–100.0)
Platelets: 141 10*3/uL — ABNORMAL LOW (ref 150–400)
Platelets: 149 10*3/uL — ABNORMAL LOW (ref 150–400)
Platelets: 143 10*3/uL — ABNORMAL LOW (ref 150–400)
RBC: 3.36 MIL/uL — ABNORMAL LOW (ref 3.87–5.11)
RBC: 3.31 MIL/uL — ABNORMAL LOW (ref 3.87–5.11)
RBC: 3.22 MIL/uL — ABNORMAL LOW (ref 3.87–5.11)
RDW: 14.9 % (ref 11.5–15.5)
RDW: 14.8 % (ref 11.5–15.5)
RDW: 14.8 % (ref 11.5–15.5)
WBC: 7.9 10*3/uL (ref 4.0–10.5)
WBC: 7.5 10*3/uL (ref 4.0–10.5)
WBC: 7 10*3/uL (ref 4.0–10.5)

## 2016-02-07 LAB — URINALYSIS, ROUTINE W REFLEX MICROSCOPIC
Bilirubin Urine: NEGATIVE
Glucose, UA: NEGATIVE mg/dL
Ketones, ur: NEGATIVE mg/dL
Nitrite: NEGATIVE
Protein, ur: NEGATIVE mg/dL
Specific Gravity, Urine: 1.006 (ref 1.005–1.030)
pH: 7.5 (ref 5.0–8.0)

## 2016-02-07 LAB — RAPID URINE DRUG SCREEN, HOSP PERFORMED
Amphetamines: NOT DETECTED
Barbiturates: NOT DETECTED
Benzodiazepines: NOT DETECTED
Cocaine: NOT DETECTED
Opiates: NOT DETECTED
Tetrahydrocannabinol: NOT DETECTED

## 2016-02-07 LAB — COMPREHENSIVE METABOLIC PANEL
ALT: 14 U/L (ref 14–54)
AST: 28 U/L (ref 15–41)
Albumin: 2.8 g/dL — ABNORMAL LOW (ref 3.5–5.0)
Alkaline Phosphatase: 106 U/L (ref 38–126)
Anion gap: 10 (ref 5–15)
BUN: <5 mg/dL — ABNORMAL LOW (ref 6–20)
CO2: 21 mmol/L — ABNORMAL LOW (ref 22–32)
Calcium: 9.2 mg/dL (ref 8.9–10.3)
Chloride: 106 mmol/L (ref 101–111)
Creatinine, Ser: 0.48 mg/dL (ref 0.44–1.00)
GFR calc Af Amer: >60 mL/min (ref >60)
GFR calc non Af Amer: >60 mL/min (ref >60)
Glucose, Bld: 86 mg/dL (ref 65–99)
Potassium: 3.8 mmol/L (ref 3.5–5.1)
Sodium: 137 mmol/L (ref 135–145)
Total Bilirubin: 0.6 mg/dL (ref 0.3–1.2)
Total Protein: 6.3 g/dL — ABNORMAL LOW (ref 6.5–8.1)

## 2016-02-07 LAB — PROTIME-INR
INR: 1.04
INR: 1.11
Prothrombin Time: 13.6 seconds (ref 11.4–15.2)
Prothrombin Time: 14.4 seconds (ref 11.4–15.2)

## 2016-02-07 LAB — KLEIHAUER-BETKE STAIN
# Vials RhIg: 2
Fetal Cells %: 0.3 %
Quantitation Fetal Hemoglobin: 15 mL

## 2016-02-07 LAB — CBC WITH DIFFERENTIAL/PLATELET
Basophils Absolute: 0 10*3/uL (ref 0.0–0.1)
Basophils Relative: 0 %
Eosinophils Absolute: 0.1 10*3/uL (ref 0.0–0.7)
Eosinophils Relative: 1 %
HCT: 31 % — ABNORMAL LOW (ref 36.0–46.0)
Hemoglobin: 10.2 g/dL — ABNORMAL LOW (ref 12.0–15.0)
Lymphocytes Relative: 16 %
Lymphs Abs: 1.1 10*3/uL (ref 0.7–4.0)
MCH: 26.4 pg (ref 26.0–34.0)
MCHC: 32.9 g/dL (ref 30.0–36.0)
MCV: 80.3 fL (ref 78.0–100.0)
Monocytes Absolute: 0.6 10*3/uL (ref 0.1–1.0)
Monocytes Relative: 9 %
Neutro Abs: 5 10*3/uL (ref 1.7–7.7)
Neutrophils Relative %: 74 %
Platelets: 165 10*3/uL (ref 150–400)
RBC: 3.86 MIL/uL — ABNORMAL LOW (ref 3.87–5.11)
RDW: 14.8 % (ref 11.5–15.5)
WBC: 6.7 10*3/uL (ref 4.0–10.5)

## 2016-02-07 LAB — ABO/RH
ABO/RH(D): A POS
ABO/RH(D): A POS

## 2016-02-07 LAB — TYPE AND SCREEN
ABO/RH(D): A POS
ABO/RH(D): A POS
Antibody Screen: NEGATIVE
Antibody Screen: NEGATIVE

## 2016-02-07 LAB — LIPASE, BLOOD: Lipase: 24 U/L (ref 11–51)

## 2016-02-07 LAB — URINE MICROSCOPIC-ADD ON

## 2016-02-07 LAB — FIBRINOGEN: Fibrinogen: 371 mg/dL (ref 210–475)

## 2016-02-07 LAB — APTT: aPTT: 33 seconds (ref 24–36)

## 2016-02-07 MED ORDER — LACTATED RINGERS IV SOLN
INTRAVENOUS | Status: DC
  Administered 2016-02-07: 15:00:00 via INTRAVENOUS

## 2016-02-07 MED ORDER — SOD CITRATE-CITRIC ACID 500-334 MG/5ML PO SOLN: 30.0000 mL | ORAL | Status: DC | PRN

## 2016-02-07 MED ORDER — OXYCODONE-ACETAMINOPHEN 5-325 MG PO TABS: 2.0000 | ORAL_TABLET | ORAL | Status: DC | PRN

## 2016-02-07 MED ORDER — LACTATED RINGERS IV BOLUS (SEPSIS)
1000.0000 mL | Freq: Once | INTRAVENOUS | Status: AC
  Administered 2016-02-07: 1000 mL via INTRAVENOUS

## 2016-02-07 MED ORDER — OXYTOCIN BOLUS FROM INFUSION
500.0000 mL | Freq: Once | INTRAVENOUS | Status: AC
  Administered 2016-02-09: 500 mL via INTRAVENOUS

## 2016-02-07 MED ORDER — ACETAMINOPHEN 325 MG PO TABS: 650.0000 mg | ORAL_TABLET | ORAL | Status: DC | PRN

## 2016-02-07 MED ORDER — BUTORPHANOL TARTRATE 1 MG/ML IJ SOLN
2.0000 mg | Freq: Once | INTRAMUSCULAR | Status: AC
  Administered 2016-02-07: 2 mg via INTRAVENOUS
  Filled 2016-02-07: qty 2

## 2016-02-07 MED ORDER — DOCUSATE SODIUM 100 MG PO CAPS: 100.0000 mg | ORAL_CAPSULE | Freq: Every day | ORAL | Status: DC

## 2016-02-07 MED ORDER — ONDANSETRON HCL 4 MG/2ML IJ SOLN
4.0000 mg | Freq: Four times a day (QID) | INTRAMUSCULAR | Status: DC | PRN
  Filled 2016-02-07: qty 2

## 2016-02-07 MED ORDER — OXYCODONE-ACETAMINOPHEN 5-325 MG PO TABS: 1.0000 | ORAL_TABLET | ORAL | Status: DC | PRN

## 2016-02-07 MED ORDER — LACTATED RINGERS IV SOLN: 500.0000 mL | INTRAVENOUS | Status: DC | PRN

## 2016-02-07 MED ORDER — ONDANSETRON HCL 4 MG/2ML IJ SOLN
4.0000 mg | Freq: Four times a day (QID) | INTRAMUSCULAR | Status: DC | PRN
  Administered 2016-02-08 – 2016-02-09 (×2): 4 mg via INTRAVENOUS
  Filled 2016-02-07 (×2): qty 2

## 2016-02-07 MED ORDER — OXYTOCIN 40 UNITS IN LACTATED RINGERS INFUSION - SIMPLE MED: 2.5000 [IU]/h | INTRAVENOUS | Status: DC

## 2016-02-07 MED ORDER — PRENATAL MULTIVITAMIN CH: 1.0000 | ORAL_TABLET | Freq: Every day | ORAL | Status: DC

## 2016-02-07 MED ORDER — LACTATED RINGERS IV SOLN
INTRAVENOUS | Status: DC
  Administered 2016-02-07: 21:00:00 via INTRAVENOUS

## 2016-02-07 MED ORDER — OXYTOCIN 40 UNITS IN LACTATED RINGERS INFUSION - SIMPLE MED
2.5000 [IU]/h | INTRAVENOUS | Status: DC
  Filled 2016-02-07: qty 1000

## 2016-02-07 MED ORDER — FENTANYL CITRATE (PF) 100 MCG/2ML IJ SOLN: 100.0000 ug | INTRAMUSCULAR | Status: DC | PRN

## 2016-02-07 MED ORDER — OXYTOCIN BOLUS FROM INFUSION: 500.0000 mL | Freq: Once | INTRAVENOUS | Status: DC

## 2016-02-07 MED ORDER — LIDOCAINE HCL (PF) 1 % IJ SOLN
30.0000 mL | INTRAMUSCULAR | Status: DC | PRN
  Filled 2016-02-07: qty 30

## 2016-02-07 MED ORDER — LIDOCAINE HCL (PF) 1 % IJ SOLN: 30.0000 mL | INTRAMUSCULAR | Status: DC | PRN

## 2016-02-07 MED ORDER — ZOLPIDEM TARTRATE 5 MG PO TABS: 5.0000 mg | ORAL_TABLET | Freq: Every evening | ORAL | Status: DC | PRN

## 2016-02-07 MED ORDER — LACTATED RINGERS IV SOLN
INTRAVENOUS | Status: DC
  Administered 2016-02-08 (×3): via INTRAVENOUS
  Administered 2016-02-09: 1000 mL via INTRAVENOUS
  Administered 2016-02-09 (×2): via INTRAVENOUS

## 2016-02-07 MED ORDER — CALCIUM CARBONATE ANTACID 500 MG PO CHEW: 2.0000 | CHEWABLE_TABLET | ORAL | Status: DC | PRN
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
## 2016-02-07 NOTE — ED Notes (Signed)
Sister -- Seward GraterMaggie-- [phone]
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
## 2016-02-07 NOTE — ED Notes (Signed)
This note also relates to the following rows which could not be included: Pulse Rate - Cannot attach notes to unvalidated device data Resp - Cannot attach notes to unvalidated device data SpO2 - Cannot attach notes to unvalidated device data  Off EFM to move to another room.

## 2016-02-07 NOTE — Progress Notes (Signed)
OB Note Late entry for 2000  KB + c/w abruption, CBC stable. Fetus category I and VS normal and stable. Pt denies any VB, LOF but feeling mild to moderate occasional UCs. D/w pt that given abruption at 37wks, I recommend proceeding with delivery which they are amenable to.  Cephalic by bedside u/s.  Will start IOL foley bulb and try for VD as long as maternal fetal status is reassuring. Coag panel ordered and normal fibrinogen, CBC, pt/inr/ptt  Cornelia Copaharlie Hyun Reali, Jr MD Attending Center for [facility](Faculty Practice)
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
## 2016-02-08 LAB — RPR: RPR Ser Ql: NONREACTIVE

## 2016-02-08 MED ORDER — MISOPROSTOL 25 MCG QUARTER TABLET
25.0000 ug | ORAL_TABLET | Freq: Once | ORAL | Status: AC
  Administered 2016-02-08: 25 ug via VAGINAL
  Filled 2016-02-08: qty 0.25

## 2016-02-08 MED ORDER — OXYTOCIN 40 UNITS IN LACTATED RINGERS INFUSION - SIMPLE MED
2.5000 [IU]/h | INTRAVENOUS | Status: DC
  Administered 2016-02-09: 2.5 [IU]/h via INTRAVENOUS

## 2016-02-08 MED ORDER — FENTANYL CITRATE (PF) 100 MCG/2ML IJ SOLN
100.0000 ug | INTRAMUSCULAR | Status: DC | PRN
  Administered 2016-02-08 – 2016-02-09 (×5): 100 ug via INTRAVENOUS
  Filled 2016-02-08 (×5): qty 2

## 2016-02-08 MED ORDER — MISOPROSTOL 50MCG HALF TABLET
50.0000 ug | ORAL_TABLET | ORAL | Status: DC
  Administered 2016-02-08: 50 ug via ORAL
  Filled 2016-02-08: qty 0.5

## 2016-02-08 MED ORDER — OXYTOCIN 40 UNITS IN LACTATED RINGERS INFUSION - SIMPLE MED
1.0000 m[IU]/min | INTRAVENOUS | Status: DC
  Administered 2016-02-08: 20 m[IU]/min via INTRAVENOUS
  Administered 2016-02-08: 2 m[IU]/min via INTRAVENOUS
  Filled 2016-02-08: qty 1000

## 2016-02-08 MED ORDER — TERBUTALINE SULFATE 1 MG/ML IJ SOLN
0.2500 mg | Freq: Once | INTRAMUSCULAR | Status: DC | PRN
  Filled 2016-02-08: qty 1
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
## 2016-02-08 NOTE — Anesthesia Pain Management Evaluation Note (Signed)
  [facility] Pain Management Visit Note  Patient: [name], 25 y.o., female  "Hello I am a member of the anesthesia team at [facility]. We have an anesthesia team available at all times to provide care throughout the hospital, including epidural management and anesthesia for C-section. I don't know your plan for the delivery whether it a natural birth, water birth, IV sedation, nitrous supplementation, doula or epidural, but we want to meet your pain goals."   1.Was your pain managed to your expectations on prior hospitalizations?   Yes   2.What is your expectation for pain management during this hospitalization?     Labor support without medications and IV pain meds  3.How can we help you reach that goal?   Record the patient's initial score and the patient's pain goal.   Pain: 7  Pain Goal: 7 [facility] wants you to be able to say your pain was always managed very well.  [name],[name] 02/08/2016

## 2016-02-08 NOTE — Progress Notes (Signed)
Monitors removed for patient to take shower.  Prior to shower patient had to  change rooms due to maintenence issue with tub.

## 2016-02-08 NOTE — Progress Notes (Signed)
Pitocin on 34 mu/min and pt not feeling as if ctx are any stronger at all.  FHR Cat 1.  States that she responded very well to cytotec in previous pregnancy.  Will take a 2 hour pit break, give a cytotec, and restart pit 4 hours after cytotec.  Dr Emelda FearFerguson agrees with plan

## 2016-02-09 ENCOUNTER — Inpatient Hospital Stay (HOSPITAL_COMMUNITY): Payer: No Typology Code available for payment source | Admitting: Anesthesiology

## 2016-02-09 ENCOUNTER — Inpatient Hospital Stay (HOSPITAL_COMMUNITY): Payer: No Typology Code available for payment source

## 2016-02-09 ENCOUNTER — Encounter (HOSPITAL_COMMUNITY): Payer: Self-pay

## 2016-02-09 DIAGNOSIS — Z3A37 37 weeks gestation of pregnancy: Secondary | ICD-10-CM

## 2016-02-09 LAB — CBC WITH DIFFERENTIAL/PLATELET
Basophils Absolute: 0 10*3/uL (ref 0.0–0.1)
Basophils Relative: 0 %
Eosinophils Absolute: 0.1 10*3/uL (ref 0.0–0.7)
Eosinophils Relative: 2 %
HCT: 26.3 % — ABNORMAL LOW (ref 36.0–46.0)
Hemoglobin: 9 g/dL — ABNORMAL LOW (ref 12.0–15.0)
Lymphocytes Relative: 19 %
Lymphs Abs: 1.2 10*3/uL (ref 0.7–4.0)
MCH: 27.7 pg (ref 26.0–34.0)
MCHC: 34.2 g/dL (ref 30.0–36.0)
MCV: 80.9 fL (ref 78.0–100.0)
Monocytes Absolute: 0.4 10*3/uL (ref 0.1–1.0)
Monocytes Relative: 6 %
Neutro Abs: 4.7 10*3/uL (ref 1.7–7.7)
Neutrophils Relative %: 73 %
Platelets: 146 10*3/uL — ABNORMAL LOW (ref 150–400)
RBC: 3.25 MIL/uL — ABNORMAL LOW (ref 3.87–5.11)
RDW: 15 % (ref 11.5–15.5)
WBC: 6.5 10*3/uL (ref 4.0–10.5)

## 2016-02-09 LAB — CBC
HCT: 26.6 % — ABNORMAL LOW (ref 36.0–46.0)
Hemoglobin: 8.9 g/dL — ABNORMAL LOW (ref 12.0–15.0)
MCH: 27 pg (ref 26.0–34.0)
MCHC: 33.5 g/dL (ref 30.0–36.0)
MCV: 80.6 fL (ref 78.0–100.0)
Platelets: 132 10*3/uL — ABNORMAL LOW (ref 150–400)
RBC: 3.3 MIL/uL — ABNORMAL LOW (ref 3.87–5.11)
RDW: 15 % (ref 11.5–15.5)
WBC: 8.1 10*3/uL (ref 4.0–10.5)

## 2016-02-09 MED ORDER — MISOPROSTOL 200 MCG PO TABS
400.0000 ug | ORAL_TABLET | Freq: Once | ORAL | Status: AC
  Administered 2016-02-09: 400 ug via ORAL

## 2016-02-09 MED ORDER — PHENYLEPHRINE 40 MCG/ML (10ML) SYRINGE FOR IV PUSH (FOR BLOOD PRESSURE SUPPORT)
80.0000 ug | PREFILLED_SYRINGE | INTRAVENOUS | Status: DC | PRN
  Filled 2016-02-09: qty 5
  Filled 2016-02-09: qty 10

## 2016-02-09 MED ORDER — FENTANYL 2.5 MCG/ML BUPIVACAINE 1/10 % EPIDURAL INFUSION (WH - ANES)
14.0000 mL/h | INTRAMUSCULAR | Status: DC | PRN
  Administered 2016-02-09: 14 mL/h via EPIDURAL
  Filled 2016-02-09: qty 100

## 2016-02-09 MED ORDER — OXYTOCIN 40 UNITS IN LACTATED RINGERS INFUSION - SIMPLE MED
1.0000 m[IU]/min | INTRAVENOUS | Status: DC
  Administered 2016-02-09: 2 m[IU]/min via INTRAVENOUS
  Filled 2016-02-09: qty 1000

## 2016-02-09 MED ORDER — LIDOCAINE HCL (PF) 1 % IJ SOLN
INTRAMUSCULAR | Status: DC | PRN
Start: 2016-02-09 — End: 2016-02-09
  Administered 2016-02-09: 4 mL via EPIDURAL
  Administered 2016-02-09: 6 mL via EPIDURAL

## 2016-02-09 MED ORDER — PHENYLEPHRINE 40 MCG/ML (10ML) SYRINGE FOR IV PUSH (FOR BLOOD PRESSURE SUPPORT)
80.0000 ug | PREFILLED_SYRINGE | INTRAVENOUS | Status: DC | PRN
  Filled 2016-02-09: qty 5

## 2016-02-09 MED ORDER — LACTATED RINGERS IV SOLN: 500.0000 mL | Freq: Once | INTRAVENOUS | Status: DC

## 2016-02-09 MED ORDER — TERBUTALINE SULFATE 1 MG/ML IJ SOLN
0.2500 mg | Freq: Once | INTRAMUSCULAR | Status: DC | PRN
Start: 2016-02-09 — End: 2016-02-10
  Filled 2016-02-09: qty 1

## 2016-02-09 MED ORDER — EPHEDRINE 5 MG/ML INJ
10.0000 mg | INTRAVENOUS | Status: DC | PRN
  Filled 2016-02-09: qty 4

## 2016-02-09 MED ORDER — DIPHENHYDRAMINE HCL 50 MG/ML IJ SOLN: 12.5000 mg | INTRAMUSCULAR | Status: DC | PRN

## 2016-02-09 MED ORDER — IBUPROFEN 600 MG PO TABS
600.0000 mg | ORAL_TABLET | Freq: Four times a day (QID) | ORAL | Status: DC
  Administered 2016-02-10 – 2016-02-11 (×5): 600 mg via ORAL
  Filled 2016-02-09 (×6): qty 1

## 2016-02-09 MED ORDER — MISOPROSTOL 200 MCG PO TABS
ORAL_TABLET | ORAL | Status: AC
  Filled 2016-02-09: qty 2

## 2016-02-09 MED ORDER — METHYLERGONOVINE MALEATE 0.2 MG/ML IJ SOLN
0.2000 mg | INTRAMUSCULAR | Status: AC
  Administered 2016-02-09: 0.2 mg via INTRAMUSCULAR
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
## 2016-02-09 NOTE — Progress Notes (Signed)
Patient seen. Discussed with patient the ultrasound report from this AM which showed no abruption. Discussed with patient should could go home or resume induction. If we resumed induction we would be comitted to delivery if induction did not work we would have to preform a c-section. Patient voiced understanding. She would like to attempt delivery. Will resume pitocin at 2x2. Discussed with Dr. Despina HiddenEure

## 2016-02-09 NOTE — Anesthesia Procedure Notes (Signed)
Epidural Patient location during procedure: OB Start time: 02/09/2016 7:52 PM End time: 02/09/2016 7:58 PM  Staffing Anesthesiologist: Linton RumpALLAN, Keelyn Fjelstad DICKERSON Performed: anesthesiologist   Preanesthetic Checklist Completed: patient identified, surgical consent, pre-op evaluation, timeout performed, IV checked, risks and benefits discussed and monitors and equipment checked  Epidural Patient position: sitting Prep: site prepped and draped and DuraPrep Patient monitoring: continuous pulse ox and blood pressure Approach: midline Location: L2-L3 Injection technique: LOR air  Needle:  Needle type: Tuohy  Needle gauge: 17 G Needle length: 9 cm and 9 Needle insertion depth: 7 cm Catheter type: closed end flexible Catheter size: 19 Gauge Catheter at skin depth: 12 cm Test dose: negative  Assessment Events: blood not aspirated, injection not painful, no injection resistance, negative IV test and no paresthesia  Additional Notes Epidural placed easily on first attempt.Reason for block:procedure for pain

## 2016-02-10 LAB — CBC
HCT: 29.3 % — ABNORMAL LOW (ref 36.0–46.0)
Hemoglobin: 9.8 g/dL — ABNORMAL LOW (ref 12.0–15.0)
MCH: 26.6 pg (ref 26.0–34.0)
MCHC: 33.4 g/dL (ref 30.0–36.0)
MCV: 79.6 fL (ref 78.0–100.0)
Platelets: 175 10*3/uL (ref 150–400)
RBC: 3.68 MIL/uL — ABNORMAL LOW (ref 3.87–5.11)
RDW: 15.1 % (ref 11.5–15.5)
WBC: 14 10*3/uL — ABNORMAL HIGH (ref 4.0–10.5)

## 2016-02-10 MED ORDER — ONDANSETRON HCL 4 MG PO TABS: 4.0000 mg | ORAL_TABLET | ORAL | Status: DC | PRN

## 2016-02-10 MED ORDER — ACETAMINOPHEN 325 MG PO TABS: 650.0000 mg | ORAL_TABLET | ORAL | Status: DC | PRN

## 2016-02-10 MED ORDER — MISOPROSTOL 200 MCG PO TABS: 400.0000 ug | ORAL_TABLET | Freq: Once | ORAL | Status: DC

## 2016-02-10 MED ORDER — SENNOSIDES-DOCUSATE SODIUM 8.6-50 MG PO TABS
2.0000 | ORAL_TABLET | ORAL | Status: DC
  Administered 2016-02-10 – 2016-02-11 (×2): 2 via ORAL
  Filled 2016-02-10: qty 2

## 2016-02-10 MED ORDER — PRENATAL MULTIVITAMIN CH
1.0000 | ORAL_TABLET | Freq: Every day | ORAL | Status: DC
  Administered 2016-02-10: 1 via ORAL
  Filled 2016-02-10: qty 1

## 2016-02-10 MED ORDER — DIBUCAINE 1 % RE OINT: 1.0000 "application " | TOPICAL_OINTMENT | RECTAL | Status: DC | PRN

## 2016-02-10 MED ORDER — METHYLERGONOVINE MALEATE 0.2 MG PO TABS
0.2000 mg | ORAL_TABLET | ORAL | Status: DC | PRN
Start: 2016-02-10 — End: 2016-02-11

## 2016-02-10 MED ORDER — ZOLPIDEM TARTRATE 5 MG PO TABS: 5.0000 mg | ORAL_TABLET | Freq: Every evening | ORAL | Status: DC | PRN

## 2016-02-10 MED ORDER — ONDANSETRON HCL 4 MG/2ML IJ SOLN: 4.0000 mg | INTRAMUSCULAR | Status: DC | PRN

## 2016-02-10 MED ORDER — METHYLERGONOVINE MALEATE 0.2 MG/ML IJ SOLN
0.2000 mg | INTRAMUSCULAR | Status: DC | PRN
Start: 2016-02-10 — End: 2016-02-11

## 2016-02-10 MED ORDER — COCONUT OIL OIL: 1.0000 "application " | TOPICAL_OIL | Status: DC | PRN

## 2016-02-10 MED ORDER — WITCH HAZEL-GLYCERIN EX PADS: 1.0000 "application " | MEDICATED_PAD | CUTANEOUS | Status: DC | PRN

## 2016-02-10 MED ORDER — BENZOCAINE-MENTHOL 20-0.5 % EX AERO: 1.0000 "application " | INHALATION_SPRAY | CUTANEOUS | Status: DC | PRN

## 2016-02-10 MED ORDER — SIMETHICONE 80 MG PO CHEW: 80.0000 mg | CHEWABLE_TABLET | ORAL | Status: DC | PRN

## 2016-02-10 MED ORDER — DIPHENHYDRAMINE HCL 25 MG PO CAPS: 25.0000 mg | ORAL_CAPSULE | Freq: Four times a day (QID) | ORAL | Status: DC | PRN

## 2016-02-10 MED ORDER — TETANUS-DIPHTH-ACELL PERTUSSIS 5-2.5-18.5 LF-MCG/0.5 IM SUSP: 0.5000 mL | Freq: Once | INTRAMUSCULAR | Status: DC

## 2016-02-10 NOTE — Progress Notes (Signed)
Post Partum Day 1 Subjective: no complaints, up ad lib, voiding, tolerating PO and + flatus  Objective: Blood pressure 107/65, pulse 69, temperature 98 F (36.7 C), temperature source Oral, resp. rate 18, height 5\' 7"  (1.702 m), weight 75.3 kg (166 lb), SpO2 99 %, unknown if currently breastfeeding. Breast-feeding Physical Exam:  General: alert, cooperative and no distress Lochia: appropriate Uterine Fundus: firm Incision: healing well DVT Evaluation: No evidence of DVT seen on physical exam.   Recent Labs  02/09/16 1914 02/10/16 0508  HGB 8.9* 9.8*  HCT 26.6* 29.3*    Assessment/Plan: Patient to consider discharge in a.m.   LOS: 3 days   Enza Shone V 02/10/2016, 10:54 PM

## 2016-02-10 NOTE — Lactation Note (Signed)
This note was copied from a baby's chart. Lactation Consultation Note Mom unsure if is going to breast or bottle feed, or both. Mom had been formula feeding. Family at bedside one encouraging her to formula feed, someone else encouraging her to breast feed. Mom doesn't act interested in breast feeding at all.  Discussed supply and demand. Asked mom to call for assistance for next BF and if has any questions. WH/LC brochure given w/resources, support groups and LC services. Patient Name: [name] ZOXWR'UToday's Date: 02/10/2016 Reason for consult: Initial assessment   Maternal Data    Feeding Feeding Type: Formula Nipple Type: Slow - flow  LATCH Score/Interventions                      Lactation Tools Discussed/Used     Consult Status Consult Status: Follow-up Date: 02/10/16 Follow-up type: In-patient    Charyl DancerCARVER, Bernita Beckstrom G 02/10/2016, 3:32 PM

## 2016-02-10 NOTE — Lactation Note (Signed)
This note was copied from a baby's chart. Mother stating she does not want to breastfeed at this time, inquired if she would like to breastfeed the baby at any time in the future, Mom states "I don't know".  Formula information given & instructions discussed with Mother & Father, they state understanding.

## 2016-02-10 NOTE — Anesthesia Postprocedure Evaluation (Signed)
Anesthesia Post Note  Patient: [name]  Procedure(s) Performed: * No procedures listed *  Patient location during evaluation: Mother Baby Anesthesia Type: Epidural Level of consciousness: awake, awake and alert, oriented and patient cooperative Pain management: pain level controlled Vital Signs Assessment: post-procedure vital signs reviewed and stable Respiratory status: spontaneous breathing, nonlabored ventilation and respiratory function stable Cardiovascular status: stable Postop Assessment: no headache, patient able to bend at knees, no backache and no signs of nausea or vomiting Anesthetic complications: no     Last Vitals:  Vitals:   02/09/16 2345 02/10/16 0345  BP: 136/77 122/72  Pulse: 75 79  Resp: 18 18  Temp: 36.8 C 37 C    Last Pain:  Vitals:   02/10/16 0345  TempSrc: Oral  PainSc: 6    Pain Goal: Patients Stated Pain Goal: 2 (02/07/16 2026)               Fronia Depass L

## 2016-02-11 LAB — HIV ANTIBODY (ROUTINE TESTING W REFLEX): HIV Screen 4th Generation wRfx: NONREACTIVE

## 2016-02-11 MED ORDER — IBUPROFEN 600 MG PO TABS: 600.0000 mg | ORAL_TABLET | Freq: Four times a day (QID) | ORAL | 0 refills | Status: DC

## 2016-02-11 NOTE — Discharge Instructions (Signed)
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
## 2016-02-12 ENCOUNTER — Encounter (HOSPITAL_COMMUNITY): Payer: Self-pay

## 2016-02-12 ENCOUNTER — Encounter (HOSPITAL_COMMUNITY): Payer: Self-pay | Admitting: *Deleted

## 2016-02-14 ENCOUNTER — Encounter: Payer: Medicaid Other | Admitting: Obstetrics and Gynecology

## 2016-03-12 ENCOUNTER — Ambulatory Visit (INDEPENDENT_AMBULATORY_CARE_PROVIDER_SITE_OTHER): Payer: Medicaid Other | Admitting: Student

## 2016-03-12 ENCOUNTER — Encounter: Payer: Self-pay | Admitting: Student

## 2016-03-12 DIAGNOSIS — Z3049 Encounter for surveillance of other contraceptives: Secondary | ICD-10-CM

## 2016-03-12 LAB — POCT PREGNANCY, URINE: Preg Test, Ur: NEGATIVE

## 2016-03-12 MED ORDER — ETONOGESTREL 68 MG ~~LOC~~ IMPL
68.0000 mg | DRUG_IMPLANT | Freq: Once | SUBCUTANEOUS | Status: AC
  Administered 2016-03-12: 68 mg via SUBCUTANEOUS
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
## 2016-03-12 NOTE — Progress Notes (Addendum)
Subjective:     [name] is a 25 y.o. female who presents for a postpartum visit. She is 4 weeks postpartum following a spontaneous vaginal delivery. I have fully reviewed the prenatal and intrapartum course. The delivery was at 6985w3d gestational weeks. Outcome: spontaneous vaginal delivery. Anesthesia: epidural. Postpartum course has been uneventful. Baby's course has been uneventful. Baby is feeding by bottle - Similac Advance. Bleeding no bleeding. Bowel function is normal. Bladder function is normal. Patient is not sexually active. Contraception method is none. Postpartum depression screening: negative.  The following portions of the patient's history were reviewed and updated as appropriate: allergies, current medications, past family history, past medical history, past social history, past surgical history and problem list.  Review of Systems Pertinent items are noted in HPI.   Objective:    BP 130/77 (BP Location: Right Arm, Patient Position: Sitting, Cuff Size: Normal)   Pulse 73   Temp 98.3 F (36.8 C) (Oral)   Wt 148 lb (67.1 kg)   LMP 06/11/2015 (LMP Unknown)   Breastfeeding? No   BMI 23.18 kg/m   General:  alert, cooperative and no distress   Breasts:  not performed  Lungs: clear to auscultation bilaterally  Heart:  regular rate and rhythm, S1, S2 normal, no murmur, click, rub or gallop  Abdomen: normal findings: bowel sounds normal   Vulva:  not evaluated  Vagina: not evaluated  Cervix:  not evaluated  Corpus: not examined  Adnexa:  not evaluated  Rectal Exam: Not performed.        Assessment:     Normal  postpartum exam. Pap smear not due (ASC-US) in June 2017.   Plan:  Normal postpartum course    1. Contraception: Nexplanon today.  2. Follow up in: 1 year  For pap test  Luna KitchensKathryn Kooistra CNM

## 2016-03-12 NOTE — Progress Notes (Signed)
     GYNECOLOGY CLINIC PROCEDURE NOTE  [name] is a 25 y.o. [id number] here for Nexplanon insertion.  Last pap smear was on 08/22/15 and was abnormal with ASCUS and negative HPV, to have repeat pap in 1 year.  No other gynecologic concerns.  Nexplanon Insertion Procedure Patient identified, informed consent performed, consent signed.   Patient does understand that irregular bleeding is a very common side effect of this medication. She was advised to have backup contraception for one week after placement. Pregnancy test in clinic today was negative.  Appropriate time out taken.  Patient's left arm was prepped and draped in the usual sterile fashion.. The ruler used to measure and mark insertion area.  Patient was prepped with alcohol swab and then injected with 3 ml of 1% lidocaine.  She was prepped with betadine, Nexplanon removed from packaging,  Device confirmed in needle, then inserted full length of needle and withdrawn per handbook instructions. Nexplanon was able to palpated in the patient's arm; patient palpated the insert herself. There was minimal blood loss.  Patient insertion site covered with guaze and a pressure bandage to reduce any bruising.  The patient tolerated the procedure well and was given post procedure instructions.    Expiration date: 05/2018  Lot #: [id number]  Cleda ClarksELIZABETH W MUMAW, DO OB FELLOW [facility], [facility]

## 2016-03-13 LAB — POCT PREGNANCY, URINE: Preg Test, Ur: NEGATIVE

## 2016-03-14 ENCOUNTER — Ambulatory Visit: Payer: Self-pay | Admitting: Student

## 2016-03-24 ENCOUNTER — Other Ambulatory Visit: Payer: Self-pay | Admitting: Obstetrics & Gynecology

## 2016-03-24 DIAGNOSIS — Z3041 Encounter for surveillance of contraceptive pills: Secondary | ICD-10-CM

## 2016-12-17 ENCOUNTER — Ambulatory Visit (INDEPENDENT_AMBULATORY_CARE_PROVIDER_SITE_OTHER): Payer: Medicaid Other | Admitting: Internal Medicine

## 2016-12-17 ENCOUNTER — Encounter: Payer: Self-pay | Admitting: Internal Medicine

## 2016-12-17 ENCOUNTER — Other Ambulatory Visit (HOSPITAL_COMMUNITY)
Admission: RE | Admit: 2016-12-17 | Discharge: 2016-12-17 | Disposition: A | Discharge status: Home / Self Care | Payer: Medicaid Other | Source: Ambulatory Visit | Attending: Family Medicine | Admitting: Family Medicine

## 2016-12-17 VITALS — BP 140/82 | HR 60 | Temp 97.9°F | Wt 144.0 lb

## 2016-12-17 DIAGNOSIS — Z87898 Personal history of other specified conditions: Secondary | ICD-10-CM | POA: Insufficient documentation

## 2016-12-17 DIAGNOSIS — Z113 Encounter for screening for infections with a predominantly sexual mode of transmission: Secondary | ICD-10-CM | POA: Diagnosis not present

## 2016-12-17 DIAGNOSIS — Z0000 Encounter for general adult medical examination without abnormal findings: Secondary | ICD-10-CM

## 2016-12-17 DIAGNOSIS — R21 Rash and other nonspecific skin eruption: Secondary | ICD-10-CM

## 2016-12-17 DIAGNOSIS — D649 Anemia, unspecified: Secondary | ICD-10-CM

## 2016-12-17 DIAGNOSIS — R5383 Other fatigue: Secondary | ICD-10-CM

## 2016-12-17 DIAGNOSIS — M4120 Other idiopathic scoliosis, site unspecified: Secondary | ICD-10-CM

## 2016-12-17 DIAGNOSIS — Z8742 Personal history of other diseases of the female genital tract: Secondary | ICD-10-CM

## 2016-12-17 MED ORDER — TRIAMCINOLONE ACETONIDE 0.025 % EX OINT: 1.0000 "application " | TOPICAL_OINTMENT | Freq: Two times a day (BID) | CUTANEOUS | 0 refills | Status: DC

## 2016-12-17 NOTE — Patient Instructions (Signed)
Ms. Deines,  I will call you with the results of your pap smear and labs.  Increase water intake and try to get in 150 minutes of exercise a week.  I will refer you to physical therapy for instructions on exercises that may help your back.  I will refer you to dermatology for the bumps on your skin. Try triamcinolone cream on your right hand rash for up to 2 weeks.  Best, Dr. Sampson Goon

## 2016-12-18 LAB — CBC
Hematocrit: 36.4 % (ref 34.0–46.6)
Hemoglobin: 11.9 g/dL (ref 11.1–15.9)
MCH: 26.4 pg — ABNORMAL LOW (ref 26.6–33.0)
MCHC: 32.7 g/dL (ref 31.5–35.7)
MCV: 81 fL (ref 79–97)
Platelets: 288 10*3/uL (ref 150–379)
RBC: 4.51 x10E6/uL (ref 3.77–5.28)
RDW: 15.3 % (ref 12.3–15.4)
WBC: 4.6 10*3/uL (ref 3.4–10.8)

## 2016-12-18 LAB — HIV ANTIBODY (ROUTINE TESTING W REFLEX): HIV Screen 4th Generation wRfx: NONREACTIVE

## 2016-12-18 LAB — RPR: RPR Ser Ql: NONREACTIVE

## 2016-12-19 LAB — CYTOLOGY - PAP
Chlamydia: NEGATIVE
Diagnosis: NEGATIVE
HPV: NOT DETECTED
Neisseria Gonorrhea: NEGATIVE

## 2016-12-19 NOTE — Progress Notes (Signed)
[facility] Progress Note  Subjective:  [name] is a 26 y.o. 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 She presents for check-up with pap.   Concerns:  - nodular bumps on R upper arm and R upper back that appeared last year during pregnancy - Rash on R hand for last several months. No family members with rash or new known exposures.  - increased fatigue; does not have periods with both nexplanon and OCP (believes she needs double protection because became pregnant on the pill before) - back pain with scoliosis and leg stiffening; not interested in surgery and back brace not helpful; says she has met with specialist before  ROS: Headaches, palpitations (no abnormalities on cardiac event monitor or on echo in 2017), back and leg pain  Social:  - Sexually active with one female partner; would like STD testing - Feels safe in her relationship - Does not smoke, drink, or use drugs  No Known Allergies  Objective: Blood pressure 140/82, pulse 60, temperature 97.9 F (36.6 C), temperature source Oral, weight 144 lb (65.3 kg), SpO2 99 %, not currently breastfeeding. Body mass index is 22.55 kg/m. Constitutional: young female in NAD HENT: Normal posterior oropharynx, no thyroid goiter.  Cardiovascular: RRR, S1, S2, no m/r/g.  Pulmonary/Chest: Effort normal and breath sounds normal. No respiratory distress.  Abdominal: Soft. +BS, NT, ND Musculoskeletal: Very obvious left lumbar curve without overlying tenderness.  Neurological: AOx3, no focal deficits. Skin: Skin is warm and dry. Hyperpigmented nodule of R upper arm that is mildly TTP with 2 small hyperpigmented patches over R upper back. Dry lips. Dry patches with papules across  GU: Multiple erythematous polyps at cervical os. No other lesions noted. No cervical motion tenderness on bimanual exam.  Psychiatric: Flat affect.   Vitals  reviewed  PHQ-2: 0  Assessment/Plan: History of abnormal cervical Pap smear - Obtained pap smear.  - Cervical polyps seen on speculum exam. Consider stopping OCPs. Will await pap results.   Rash and nonspecific skin eruption - Dry rash of hand appears consistent with eczema. Considered scabies given involvement of fingerwebs but no other family members with symptoms and no tunneling. Recommended trying triamcinolone cream. - Nodule of R upper arm with some hyperpigmentation; stable in size, slightly tender, no drainage. Suspect inflammatory response to some initial break in skin, such as insect bite. Checking RPR with routine STD labs. Patient requests dermatology referral.  Fatigue - Will obtain CBC given history of anemia. Suspect chronic back pain contributing.   Idiopathic scoliosis - Will refer to PT for introductory session for stabilizing exercises, as patient not interested in surgery.  - Continue ibuprofen and tylenol prn  Follow-up prn.  Olene Floss, MD [place], PGY-3

## 2016-12-20 ENCOUNTER — Telehealth: Payer: Self-pay | Admitting: Internal Medicine

## 2016-12-20 DIAGNOSIS — R21 Rash and other nonspecific skin eruption: Secondary | ICD-10-CM | POA: Insufficient documentation

## 2016-12-20 DIAGNOSIS — M4120 Other idiopathic scoliosis, site unspecified: Secondary | ICD-10-CM | POA: Insufficient documentation

## 2016-12-20 DIAGNOSIS — Z8742 Personal history of other diseases of the female genital tract: Secondary | ICD-10-CM

## 2016-12-20 DIAGNOSIS — R5383 Other fatigue: Secondary | ICD-10-CM | POA: Insufficient documentation

## 2016-12-20 HISTORY — DX: Personal history of other diseases of the female genital tract: Z87.42

## 2016-12-20 NOTE — Telephone Encounter (Signed)
Called patient to review recent lab results. Let her know that her pap was normal. I did see a few cervical polyps. She denies vaginal bleeding or pain with intercourse. Gave her option of sampling polyp and sending for biopsy; she will wait at this time. She says dermatology referral was not approved; she would like to be referred to [facility] dermatology clinic. Will place order.  Dani Gobble, MD [facility], PGY-3

## 2016-12-20 NOTE — Assessment & Plan Note (Signed)
-   Will obtain CBC given history of anemia. Suspect chronic back pain contributing.

## 2016-12-20 NOTE — Assessment & Plan Note (Signed)
-   Dry rash of hand appears consistent with eczema. Considered scabies given involvement of fingerwebs but no other family members with symptoms and no tunneling. Recommended trying triamcinolone cream. - Nodule of R upper arm with some hyperpigmentation; stable in size, slightly tender, no drainage. Suspect inflammatory response to some initial break in skin, such as insect bite. Checking RPR with routine STD labs. Patient requests dermatology referral.

## 2016-12-20 NOTE — Assessment & Plan Note (Signed)
-   Will refer to PT for introductory session for stabilizing exercises, as patient not interested in surgery.  - Continue ibuprofen and tylenol prn

## 2016-12-20 NOTE — Assessment & Plan Note (Signed)
-   Obtained pap smear.  - Cervical polyps seen on speculum exam. Consider stopping OCPs. Will await pap results.

## 2016-12-31 ENCOUNTER — Ambulatory Visit: Payer: Medicaid Other | Admitting: Physical Therapy

## 2017-01-16 ENCOUNTER — Ambulatory Visit: Payer: Self-pay

## 2017-03-30 ENCOUNTER — Other Ambulatory Visit: Payer: Self-pay | Admitting: Obstetrics & Gynecology

## 2017-03-30 DIAGNOSIS — Z3041 Encounter for surveillance of contraceptive pills: Secondary | ICD-10-CM

## 2017-05-06 ENCOUNTER — Other Ambulatory Visit: Payer: Self-pay | Admitting: Obstetrics & Gynecology

## 2017-05-06 DIAGNOSIS — Z3041 Encounter for surveillance of contraceptive pills: Secondary | ICD-10-CM

## 2017-05-09 ENCOUNTER — Telehealth: Payer: Self-pay | Admitting: *Deleted

## 2017-05-09 NOTE — Telephone Encounter (Signed)
Pt called and left a message for us to call her back. Called patient and left message that I am returning her call.

## 2017-05-14 NOTE — Telephone Encounter (Signed)
Called patient, no answer- left message stating we are trying to reach you to return your phone call, please call us back if you still need assistance.  

## 2017-05-23 ENCOUNTER — Encounter: Payer: Self-pay | Admitting: *Deleted

## 2017-05-23 ENCOUNTER — Telehealth: Payer: Self-pay | Admitting: *Deleted

## 2017-05-23 NOTE — Telephone Encounter (Signed)
Patient called and left a message requesting a [facility] [address]. Upon chart review she received a new prescription on 05/07/17 for a 3 month supply with 5 refills [facility] [address] Will send MyChart message to patient.

## 2017-05-27 ENCOUNTER — Telehealth: Payer: Self-pay | Admitting: *Deleted

## 2017-05-27 DIAGNOSIS — Z3041 Encounter for surveillance of contraceptive pills: Secondary | ICD-10-CM

## 2017-05-27 MED ORDER — NORGESTIMATE-ETH ESTRADIOL 0.25-35 MG-MCG PO TABS: 1.0000 | ORAL_TABLET | Freq: Every day | ORAL | 5 refills | Status: DC

## 2017-05-27 NOTE — Telephone Encounter (Signed)
Pt left message stating that [facility] to [facility]. Per chart review, the Rx was printed on 3/8 rather than e-prescribed. I re-sent Rx and left voice mail for pt that she may now pick it up. I apologized for any inconvenience.

## 2017-07-17 ENCOUNTER — Encounter (HOSPITAL_COMMUNITY): Payer: Self-pay

## 2017-07-17 ENCOUNTER — Emergency Department (HOSPITAL_COMMUNITY)
Admission: EM | Admit: 2017-07-17 | Discharge: 2017-07-17 | Discharge status: Against Medical Advice | Payer: Medicaid Other | Attending: Emergency Medicine | Admitting: Emergency Medicine

## 2017-07-17 DIAGNOSIS — N938 Other specified abnormal uterine and vaginal bleeding: Secondary | ICD-10-CM | POA: Diagnosis not present

## 2017-07-17 DIAGNOSIS — Z5321 Procedure and treatment not carried out due to patient leaving prior to being seen by health care provider: Secondary | ICD-10-CM | POA: Diagnosis not present

## 2017-07-17 LAB — URINALYSIS, ROUTINE W REFLEX MICROSCOPIC
Bilirubin Urine: NEGATIVE
Glucose, UA: NEGATIVE mg/dL
Hgb urine dipstick: NEGATIVE
Ketones, ur: NEGATIVE mg/dL
Nitrite: NEGATIVE
Protein, ur: NEGATIVE mg/dL
Specific Gravity, Urine: 1.023 (ref 1.005–1.030)
pH: 6 (ref 5.0–8.0)

## 2017-07-17 LAB — POC URINE PREG, ED: Preg Test, Ur: NEGATIVE

## 2017-07-17 NOTE — ED Triage Notes (Signed)
Patient complains of 2 days of light vaginal bleeding, denies pain. States that she is concerned because of cervical cancer in past

## 2017-07-17 NOTE — ED Notes (Signed)
Pt states she cannot stay here all day and needs to go, wants to know how long it will be, told her she still had several  Ahead of her,  But we would like for her to stay, pt states she is leaving , bracelet cut off and pt left , she did state that she had called her dr and that they could see her tomorrow but she came here, pt left

## 2017-07-17 NOTE — ED Notes (Signed)
Called Patient to reassess vitals. No answer

## 2017-07-18 ENCOUNTER — Ambulatory Visit: Payer: Medicaid Other | Admitting: Family Medicine

## 2017-07-22 ENCOUNTER — Emergency Department (HOSPITAL_COMMUNITY)
Admission: EM | Admit: 2017-07-22 | Discharge: 2017-07-22 | Discharge status: Against Medical Advice | Payer: Medicaid Other | Attending: Emergency Medicine | Admitting: Emergency Medicine

## 2017-07-22 ENCOUNTER — Encounter (HOSPITAL_COMMUNITY): Payer: Self-pay | Admitting: Emergency Medicine

## 2017-07-22 DIAGNOSIS — Z5321 Procedure and treatment not carried out due to patient leaving prior to being seen by health care provider: Secondary | ICD-10-CM | POA: Insufficient documentation

## 2017-07-22 DIAGNOSIS — N898 Other specified noninflammatory disorders of vagina: Secondary | ICD-10-CM | POA: Diagnosis present

## 2017-07-22 NOTE — ED Triage Notes (Signed)
Pt c/o brown vaginal discharge for several days with foul odor. Reports after sex she will have light bleeding.

## 2017-07-24 ENCOUNTER — Ambulatory Visit: Payer: Medicaid Other | Admitting: Family Medicine

## 2017-08-25 ENCOUNTER — Encounter (HOSPITAL_COMMUNITY): Payer: Self-pay | Admitting: Emergency Medicine

## 2017-08-25 ENCOUNTER — Emergency Department (HOSPITAL_COMMUNITY)
Admission: EM | Admit: 2017-08-25 | Discharge: 2017-08-25 | Disposition: A | Discharge status: Home / Self Care | Payer: Medicaid Other | Attending: Emergency Medicine | Admitting: Emergency Medicine

## 2017-08-25 DIAGNOSIS — Z7982 Long term (current) use of aspirin: Secondary | ICD-10-CM | POA: Diagnosis not present

## 2017-08-25 DIAGNOSIS — I10 Essential (primary) hypertension: Secondary | ICD-10-CM | POA: Diagnosis not present

## 2017-08-25 DIAGNOSIS — Z79899 Other long term (current) drug therapy: Secondary | ICD-10-CM | POA: Diagnosis not present

## 2017-08-25 DIAGNOSIS — R197 Diarrhea, unspecified: Secondary | ICD-10-CM | POA: Insufficient documentation

## 2017-08-25 LAB — COMPREHENSIVE METABOLIC PANEL
ALT: 15 U/L (ref 14–54)
AST: 20 U/L (ref 15–41)
Albumin: 3.7 g/dL (ref 3.5–5.0)
Alkaline Phosphatase: 53 U/L (ref 38–126)
Anion gap: 8 (ref 5–15)
BUN: 10 mg/dL (ref 6–20)
CO2: 24 mmol/L (ref 22–32)
Calcium: 8.1 mg/dL — ABNORMAL LOW (ref 8.9–10.3)
Chloride: 104 mmol/L (ref 101–111)
Creatinine, Ser: 0.72 mg/dL (ref 0.44–1.00)
GFR calc Af Amer: >60 mL/min (ref >60)
GFR calc non Af Amer: >60 mL/min (ref >60)
Glucose, Bld: 89 mg/dL (ref 65–99)
Potassium: 3.2 mmol/L — ABNORMAL LOW (ref 3.5–5.1)
Sodium: 136 mmol/L (ref 135–145)
Total Bilirubin: 0.6 mg/dL (ref 0.3–1.2)
Total Protein: 8 g/dL (ref 6.5–8.1)

## 2017-08-25 LAB — LIPASE, BLOOD: Lipase: 28 U/L (ref 11–51)

## 2017-08-25 LAB — CBC
HCT: 38.3 % (ref 36.0–46.0)
Hemoglobin: 12.7 g/dL (ref 12.0–15.0)
MCH: 27.7 pg (ref 26.0–34.0)
MCHC: 33.2 g/dL (ref 30.0–36.0)
MCV: 83.6 fL (ref 78.0–100.0)
Platelets: 253 10*3/uL (ref 150–400)
RBC: 4.58 MIL/uL (ref 3.87–5.11)
RDW: 13.3 % (ref 11.5–15.5)
WBC: 4.2 10*3/uL (ref 4.0–10.5)

## 2017-08-25 LAB — I-STAT BETA HCG BLOOD, ED (MC, WL, AP ONLY): I-stat hCG, quantitative: <5 m[IU]/mL (ref <5)

## 2017-08-25 MED ORDER — ONDANSETRON HCL 4 MG PO TABS: 4.0000 mg | ORAL_TABLET | Freq: Four times a day (QID) | ORAL | 0 refills | Status: DC

## 2017-08-25 MED ORDER — SODIUM CHLORIDE 0.9 % IV BOLUS
1000.0000 mL | Freq: Once | INTRAVENOUS | Status: AC
  Administered 2017-08-25: 1000 mL via INTRAVENOUS

## 2017-08-25 MED ORDER — ONDANSETRON HCL 4 MG/2ML IJ SOLN: 4.0000 mg | Freq: Once | INTRAMUSCULAR | Status: DC

## 2017-08-25 NOTE — ED Triage Notes (Signed)
Per pt, states abdominal pain, nausea and loose stools for a couple of days-no vomiting

## 2017-08-25 NOTE — ED Provider Notes (Signed)
[facility]-EMERGENCY DEPT Provider Note   CSN: [id number] Arrival date & time: 08/25/17  0802     History   Chief Complaint Chief Complaint  Patient presents with  . Abdominal Pain    HPI [name] is a 27 y.o. female.  27 year old female without significant prior medical history presents with complaint of nausea, diarrhea, and diffuse abdominal crampiness.  Patient's symptoms started 2 days prior.  She denies fever.  She denies emesis.  She reports approximately 4-5 loose stools yesterday.  She denies any blood in her stool.  She did not take anything at home for her symptoms.  She reports sick contacts with similar symptoms.  The history is provided by the patient.  Abdominal Pain   This is a new problem. The current episode started 2 days ago. The problem occurs constantly. The problem has not changed since onset.The pain is located in the generalized abdominal region. The quality of the pain is cramping. The pain is mild. Associated symptoms include diarrhea and nausea. Pertinent negatives include melena and dysuria. Nothing aggravates the symptoms. Nothing relieves the symptoms.    Past Medical History:  Diagnosis Date  . Anemia   . CIN II (cervical intraepithelial neoplasia II)   . Heart murmur   . History of gestational hypertension   . Hypertension   . Scoliosis   . Vaginal Pap smear, abnormal     Patient Active Problem List   Diagnosis Date Noted  . History of abnormal cervical Pap smear 12/20/2016  . Idiopathic scoliosis 12/20/2016  . Rash and nonspecific skin eruption 12/20/2016  . Fatigue 12/20/2016  . Normal postpartum course 03/12/2016  . Moderate dysplasia of cervix (CIN II) 06/09/2014  . Low grade squamous intraepithelial lesion (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 09/09/2012    Past Surgical History:  Procedure Laterality Date  . LEEP  07/14/14   CIN II with negative margins  . LEEP     cervical intraepithelial neoplasm     OB  History    Gravida  6   Para  5   Term  5   Preterm  0   AB  0   Living  3     SAB  0   TAB  0   Ectopic  0   Multiple      Live Births  3            Home Medications    Prior to Admission medications   Medication Sig Start Date End Date Taking? Authorizing Provider  aspirin 81 MG chewable tablet Chew 1 tablet (81 mg total) by mouth daily. Patient not taking: Reported on 03/12/2016 11/29/15   Marlis EdelsonKarim, Walidah N, CNM  ESTARYLLA 0.25-35 MG-MCG tablet TAKE 1 TABLET BY MOUTH DAILY 03/31/17   Constant, Gigi GinPeggy, MD  ferrous sulfate (FERROUSUL) 325 (65 FE) MG tablet Take 1 tablet (325 mg total) by mouth 2 (two) times daily. Patient not taking: Reported on 03/12/2016 12/01/15   Marlis EdelsonKarim, Walidah N, CNM  ibuprofen (ADVIL,MOTRIN) 600 MG tablet Take 1 tablet (600 mg total) by mouth every 6 (six) hours. Patient not taking: Reported on 03/12/2016 02/11/16   Arabella MerlesShaw, Kimberly D, CNM  Misc. Devices MISC Belly Band - Maternity Support Patient not taking: Reported on 03/12/2016 11/29/15   Marlis EdelsonKarim, Walidah N, CNM  norgestimate-ethinyl estradiol (ESTARYLLA) 0.25-35 MG-MCG tablet Take 1 tablet by mouth daily. 05/27/17   Anyanwu, Jethro BastosUgonna A, MD  Prenatal Vit-Fe Fumarate-FA (MULTIVITAMIN-PRENATAL) 27-0.8 MG TABS tablet Take 1 tablet  by mouth daily at 12 noon.    [provider]  Prenatal Vit-Fe Fumarate-FA (PRENATAL MULTIVITAMIN) TABS tablet Take 1 tablet by mouth daily at 12 noon.    [provider]  triamcinolone (KENALOG) 0.025 % ointment Apply 1 application topically 2 (two) times daily. 12/17/16   Casey Burkitt, MD    Family History Family History  Problem Relation Age of Onset  . Cancer Maternal Uncle   . Anemia Mother   . Migraines Mother   . Mitral valve prolapse Sister   . Heart murmur Brother   . Heart attack Maternal Grandmother   . Mitral valve prolapse Maternal Grandmother   . Heart disease Maternal Grandmother   . Diabetes Maternal Grandmother   .  Hyperlipidemia Maternal Grandmother   . Hypertension Maternal Grandmother     Social History Social History   Tobacco Use  . Smoking status: Never Smoker  . Smokeless tobacco: Never Used  Substance Use Topics  . Alcohol use: No  . Drug use: No     Allergies   Patient has no known allergies.   Review of Systems Review of Systems  Gastrointestinal: Positive for abdominal pain, diarrhea and nausea. Negative for melena.  Genitourinary: Negative for dysuria.  All other systems reviewed and are negative.    Physical Exam Updated Vital Signs BP (!) 143/97 (BP Location: Right Arm)   Pulse 94   Temp 98.4 F (36.9 C) (Oral)   Resp 18   LMP 08/11/2017   SpO2 100%   Physical Exam  Constitutional: She is oriented to person, place, and time. She appears well-developed and well-nourished. No distress.  HENT:  Head: Normocephalic and atraumatic.  Mouth/Throat: Oropharynx is clear and moist.  Eyes: Pupils are equal, round, and reactive to light. Conjunctivae and EOM are normal.  Neck: Normal range of motion. Neck supple.  Cardiovascular: Normal rate, regular rhythm and normal heart sounds.  Pulmonary/Chest: Effort normal and breath sounds normal. No respiratory distress.  Abdominal: Soft. She exhibits no distension. There is no tenderness.  Musculoskeletal: Normal range of motion. She exhibits no edema or deformity.  Neurological: She is alert and oriented to person, place, and time.  Skin: Skin is warm and dry.  Psychiatric: She has a normal mood and affect.  Nursing note and vitals reviewed.    ED Treatments / Results  Labs (all labs ordered are listed, but only abnormal results are displayed) Labs Reviewed  COMPREHENSIVE METABOLIC PANEL - Abnormal; Notable for the following components:      Result Value   Potassium 3.2 (*)    Calcium 8.1 (*)    All other components within normal limits  LIPASE, BLOOD  CBC  I-STAT BETA HCG BLOOD, ED (MC, [facility], AP ONLY)     EKG None  Radiology No results found.  Procedures Procedures (including critical care time)  Medications Ordered in ED Medications  sodium chloride 0.9 % bolus 1,000 mL (has no administration in time range)  ondansetron (ZOFRAN) injection 4 mg (has no administration in time range)     Initial Impression / Assessment and Plan / ED Course  I have reviewed the triage vital signs and the nursing notes.  Pertinent labs & imaging results that were available during my care of the patient were reviewed by me and considered in my medical decision making (see chart for details).    MDM  Screen complete  Patient is presenting for evaluation of abdominal cramping and diarrhea.  Patient's exam is most suggestive  of likely viral etiology.  Screening labs do not reveal significant abnormality.  Patient feels significantly improved following IV fluids and Zofran.  She desires discharge home.  Close follow-up instructions given and understood.  Strict return precautions given and understood.   Final Clinical Impressions(s) / ED Diagnoses   Final diagnoses:  Diarrhea, unspecified type    ED Discharge Orders        Ordered    ondansetron (ZOFRAN) 4 MG tablet  Every 6 hours     08/25/17 1036       Wynetta Fines, MD 08/27/17 1448

## 2017-08-25 NOTE — Discharge Instructions (Signed)
Please return for any problem. Follow up with your primary care provider as instructed. Drink plenty of fluids.

## 2017-09-05 ENCOUNTER — Ambulatory Visit: Payer: Medicaid Other | Admitting: Internal Medicine

## 2017-09-05 ENCOUNTER — Encounter: Payer: Self-pay | Admitting: Internal Medicine

## 2017-09-05 VITALS — BP 140/90 | HR 63 | Temp 97.9°F | Wt 136.2 lb

## 2017-09-05 DIAGNOSIS — N750 Cyst of Bartholin's gland: Secondary | ICD-10-CM | POA: Insufficient documentation

## 2017-09-05 NOTE — Progress Notes (Signed)
   Subjective:   Patient: [name]       Birthdate: 01/14/1991       MRN: [id number]      HPI  [name] MangoBrittany Demedeiros is a 27 y.o. female presenting for same day appt for vaginal lesion.   Vaginal lesion Noticed two nights ago when taking a shower. Says it feels like a bump that is hanging. Put Vaseline on it but nothing else. Painful to touch but not otherwise. Denies tingling except some with urination. No dysuria. Unsure of LMP as she takes OCPs and has Nexplanon and thus has very irregular periods. Says she is not sexually active. Has never had anything like this before. Denies vaginal discharge.   Smoking status reviewed. Patient is never smoker.   Review of Systems See HPI.     Objective:  Physical Exam  Constitutional: She is oriented to person, place, and time. She appears well-developed and well-nourished. No distress.  HENT:  Head: Normocephalic and atraumatic.  Pulmonary/Chest: Effort normal. No respiratory distress.  Genitourinary:  Genitourinary Comments: Mass consistent in appearance with Bartholin's gland cyst on L. Non-tender to palpation. No erythema or signs of infection.   Neurological: She is alert and oriented to person, place, and time.  Psychiatric: She has a normal mood and affect. Her behavior is normal.     Assessment & Plan:  Cyst of left Bartholin's gland Non-tender and no signs of infection. Will refer to gyn, however as not currently causing patient any issues, unsure if removal is necessary at this time.  - Referral to gyn   Tarri AbernethyAbigail J Merlen Gurry, MD, MPH PGY-3 [facility] Pager [phone]

## 2017-09-05 NOTE — Patient Instructions (Addendum)
It was nice meeting you today [name]!  The bump you felt on your vagina looks most like a Bartholin gland cyst. I have placed a referral to a gynecologist for you to determine if it should be removed or not. They will call you with the date and time of your appointment.   If you have any questions or concerns, please feel free to call the clinic.   Be well,  Dr. Natale MilchLancaster   Bartholin Cyst or Abscess A Bartholin cyst is a fluid-filled sac that forms on a Bartholin gland. Bartholin glands are small glands that are found in the folds of skin (labia) on the sides of the lower opening of the vagina. This type of cyst causes a bulge on the side of the vagina. A cyst that is not large or infected may not cause problems. However, if the fluid in the cyst becomes infected, the cyst can turn into an abscess. An abscess may cause discomfort or pain. Follow these instructions at home:  Take medicines only as told by your doctor.  If you were prescribed an antibiotic medicine, finish all of it even if you start to feel better.  Apply warm, wet compresses to the area or take warm, shallow baths that cover your pelvic area (sitz baths). Do this many times each day or as told by your doctor.  Do not squeeze the cyst. Do not apply heavy pressure to it.  Do not have sex until the cyst has gone away.  If your cyst or abscess was opened by your doctor, a small piece of gauze or a drain may have been placed in the area. That lets the cyst drain. Do not remove the gauze or the drain until your doctor tells you it is okay to do that.  Do not wear tampons. Wear feminine pads as needed for any fluid or blood.  Keep all follow-up visits as told by your doctor. This is important. Contact a doctor if:  Your pain, puffiness (swelling), or redness in the area of the cyst gets worse.  You have fluid or pus pus coming from the cyst.  You have a fever. This information is not intended to replace advice given  to you by your health care provider. Make sure you discuss any questions you have with your health care provider. Document Released: 05/31/2008 Document Revised: 08/10/2015 Document Reviewed: 10/18/2013 Elsevier Interactive Patient Education  2018 [employer]

## 2017-09-05 NOTE — Assessment & Plan Note (Signed)
Non-tender and no signs of infection. Will refer to gyn, however as not currently causing patient any issues, unsure if removal is necessary at this time.  - Referral to gyn

## 2017-09-15 IMAGING — DX DG CHEST 2V
2 series · 2 of 2 positions shown · non-contrast
Comparison: None.

CLINICAL DATA: Irregular heart beat.

EXAM:
CHEST  2 VIEW

[chest pa]
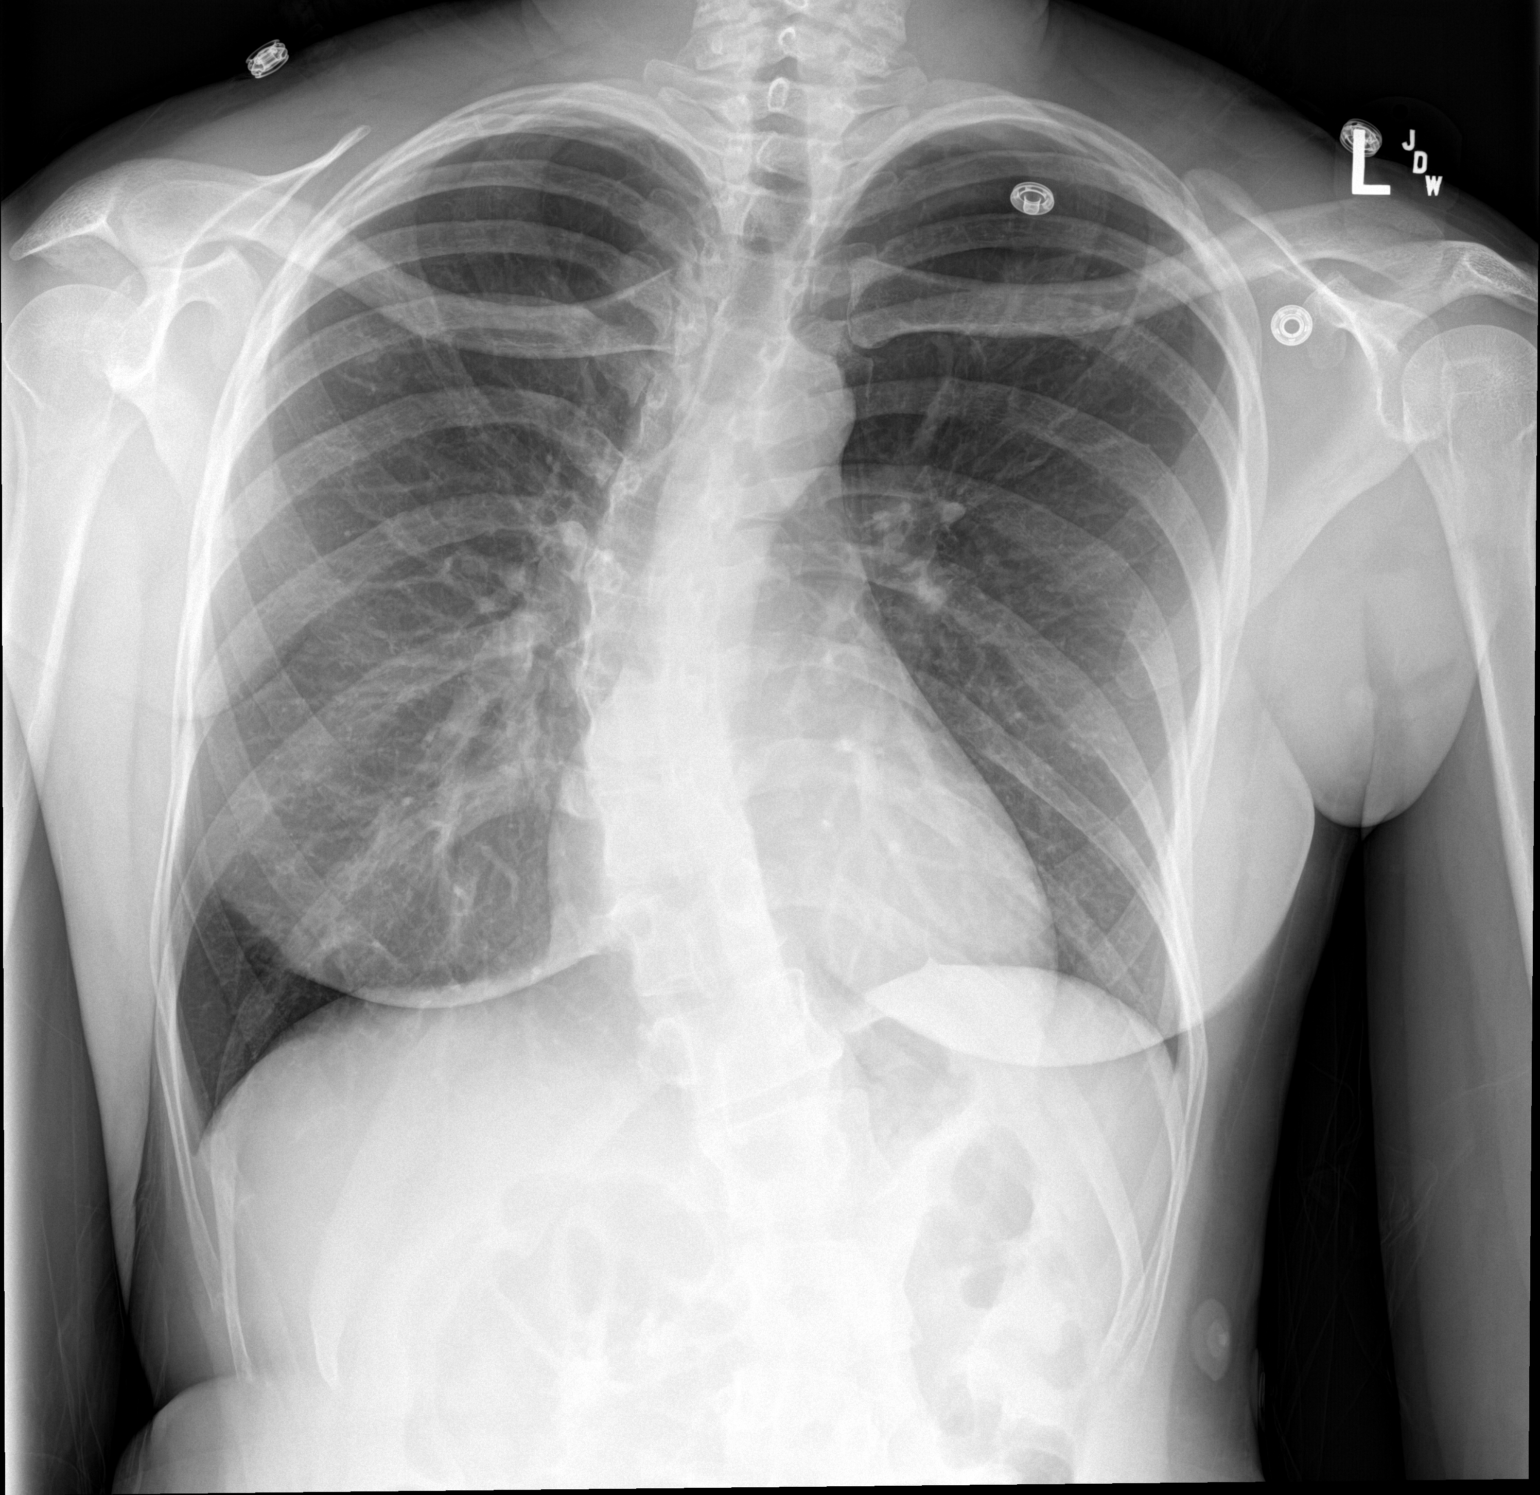

[chest lat]
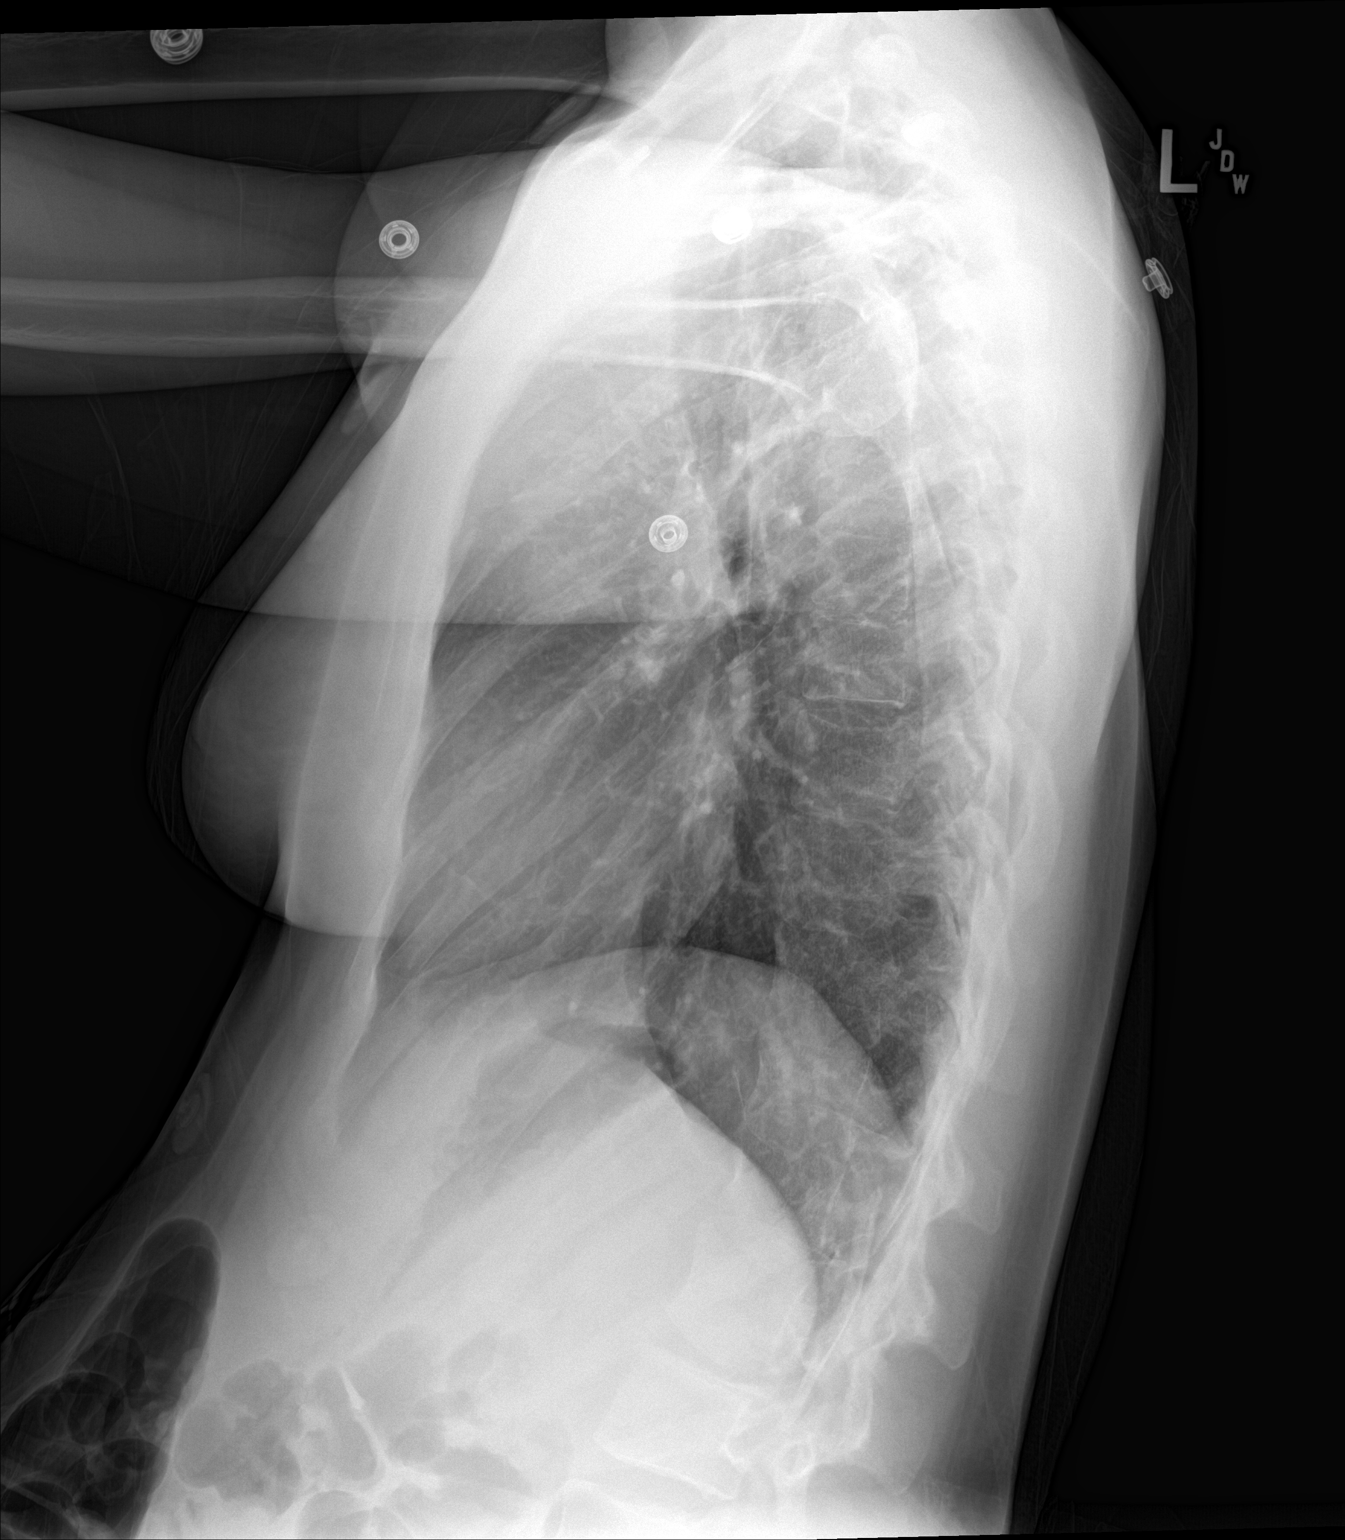

[2 of 2 positions shown; findings below may reference images not displayed]

FINDINGS: Heart and mediastinal contours are within normal limits. No focal
opacities or effusions. No acute bony abnormality. S-shaped
thoracolumbar scoliosis.
IMPRESSION: No active cardiopulmonary disease.

## 2017-09-29 ENCOUNTER — Encounter: Payer: Self-pay | Admitting: Obstetrics & Gynecology

## 2017-09-29 ENCOUNTER — Ambulatory Visit: Payer: Medicaid Other | Admitting: Obstetrics & Gynecology

## 2017-09-29 VITALS — BP 131/76 | HR 90 | Wt 137.5 lb

## 2017-09-29 DIAGNOSIS — N750 Cyst of Bartholin's gland: Secondary | ICD-10-CM | POA: Diagnosis present

## 2017-09-29 NOTE — Progress Notes (Signed)
   Subjective:    Patient ID: [name] MangoBrittany [name], female    DOB: 10/29/1990, 27 y.o.   MRN: [id number]  HPI 27 yo here for I&D of Bartholin's cyst. It has grown over the last month. This is her first occasion of this problem.   Review of Systems She reports normal pap up to date.    Objective:   Physical Exam  Breathing, conversing, and ambulating normally Well nourished, well hydrated Black female, no apparent distress I prepped her left labia with betadine. I injected 3 cc of 1% lidocaine and made an incision with a #11 scapel. Clear fluid drained and the cyst resolved. I placed a Word catheter.    Assessment & Plan:  Bartholin's cyst- now status post I&D Come back 2 weeks

## 2017-10-15 ENCOUNTER — Ambulatory Visit (INDEPENDENT_AMBULATORY_CARE_PROVIDER_SITE_OTHER): Payer: Medicaid Other | Admitting: Obstetrics & Gynecology

## 2017-10-15 ENCOUNTER — Ambulatory Visit: Payer: Self-pay | Admitting: Obstetrics & Gynecology

## 2017-10-15 ENCOUNTER — Encounter: Payer: Self-pay | Admitting: Obstetrics & Gynecology

## 2017-10-15 VITALS — BP 125/96 | HR 94 | Ht 67.0 in | Wt 137.0 lb

## 2017-10-15 DIAGNOSIS — N750 Cyst of Bartholin's gland: Secondary | ICD-10-CM

## 2017-10-15 NOTE — Progress Notes (Signed)
   GYNECOLOGY OFFICE VISIT NOTE  History:  27 y.o. [id number] here today for follow up after I&D and Word Catheter placement for Left Bartholin's Cyst on 09/29/2017. Mild discomfort with the Word catheter but it is still in place. She denies any abnormal vaginal discharge, bleeding, pelvic pain or other concerns.   Past Medical History:  Diagnosis Date  . Anemia   . CIN II (cervical intraepithelial neoplasia II)   . Heart murmur   . History of gestational hypertension   . Hypertension   . Scoliosis   . Vaginal Pap smear, abnormal     Past Surgical History:  Procedure Laterality Date  . LEEP  07/14/14   CIN II with negative margins  . LEEP     cervical intraepithelial neoplasm    The following portions of the patient's history were reviewed and updated as appropriate: allergies, current medications, past family history, past medical history, past social history, past surgical history and problem list.   Health Maintenance:  Normal pap and negative HRHPV on 12/17/2016.    Review of Systems:  Pertinent items noted in HPI and remainder of comprehensive ROS otherwise negative.  Objective:  Physical Exam BP (!) 125/96   Pulse 94   Ht 5\' 7"  (1.702 m)   Wt 137 lb (62.1 kg)   Breastfeeding? No   BMI 21.46 kg/m  CONSTITUTIONAL: Well-developed, well-nourished female in no acute distress.  MUSCULOSKELETAL: Normal range of motion. No edema noted. NEUROLOGIC: Alert and oriented to person, place, and time. Normal muscle tone coordination. No cranial nerve deficit noted. PSYCHIATRIC: Normal mood and affect. Normal behavior. Normal judgment and thought content. CARDIOVASCULAR: Normal heart rate noted RESPIRATORY: Effort and breath sounds normal, no problems with respiration noted ABDOMEN: Soft, no distention noted.   PELVIC: Normal appearing external genitalia; Word catheter in place. No erythema noted.  No drainage.   Assessment & Plan:  1. Bartholin's gland cyst -The catheter will be  left in place for at least four weeks to promote formation of an epithelialized tract for permanent drainage of glandular secretions. Patient informed. Will keep catheter in place.  Routine preventative health maintenance measures emphasized. Please refer to After Visit Summary for other counseling recommendations.   Return in about 2 weeks (around 10/29/2017) for Word catheter removal.   Total face-to-face time with patient: 10 minutes.  Over 50% of encounter was spent on counseling and coordination of care.   Jaynie CollinsUGONNA  Shevonne Wolf, MD, FACOG Obstetrician & Gynecologist, [facility], [facility]

## 2017-10-15 NOTE — Patient Instructions (Signed)
Return to clinic for any scheduled appointments or for any gynecologic concerns as needed.   

## 2017-11-07 ENCOUNTER — Encounter: Payer: Self-pay | Admitting: Obstetrics & Gynecology

## 2017-11-07 ENCOUNTER — Ambulatory Visit (INDEPENDENT_AMBULATORY_CARE_PROVIDER_SITE_OTHER): Payer: Medicaid Other | Admitting: Obstetrics & Gynecology

## 2017-11-07 VITALS — BP 136/72 | HR 84 | Ht 67.0 in | Wt 137.4 lb

## 2017-11-07 DIAGNOSIS — N750 Cyst of Bartholin's gland: Secondary | ICD-10-CM | POA: Diagnosis present

## 2017-11-07 NOTE — Progress Notes (Signed)
   Subjective:    Patient ID: [name], female    DOB: 03/24/1990, 27 y.o.   MRN: [id number]  HPI 27 yo P3 here for removal of Word catheter that I placed on 09-29-17 for a left Barthollin's cyst abscess. She is having no pain or other concerns today.   Review of Systems Her pap was normal 2018.    Objective:   Physical Exam Breathing, conversing, and ambulating normally Well nourished, well hydrated Black female, no apparent distress Word catheter removed She tolerated the procedure well.     Assessment & Plan:  Preventative care- rec pap in 2021. She declines a flu vaccine today. Reassurance about the now resolved Bartholin's

## 2018-04-19 IMAGING — US US OB COMP LESS 14 WK
1 series · 14 of 28 positions shown · non-contrast
Comparison: None.

CLINICAL DATA: Lower abdominal and back pain during pregnancy.
Estimated gestational age by LMP is 4 weeks 5 days. Positive urine
pregnancy test. No quantitative beta HCG level is reported.

EXAM:
OBSTETRIC <14 WK ULTRASOUND
TECHNIQUE: Transabdominal ultrasound was performed for evaluation of the
gestation as well as the maternal uterus and adnexal regions.

[Series 1: us ob comp less 14 wk · 0.23mm/px · 14 of 34 slices shown]
[im 2/34]
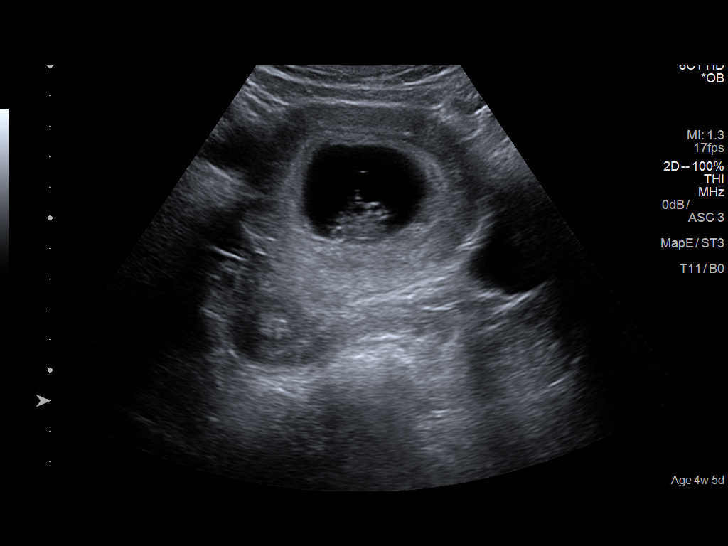
[im 4/34]
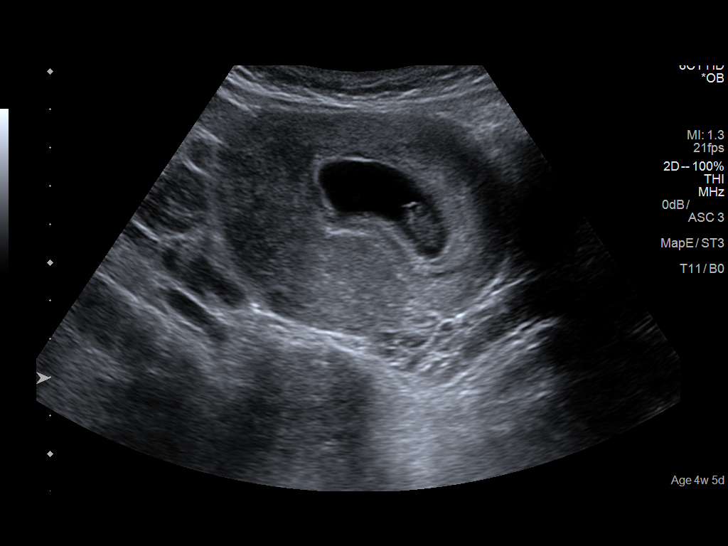
[im 7/34]
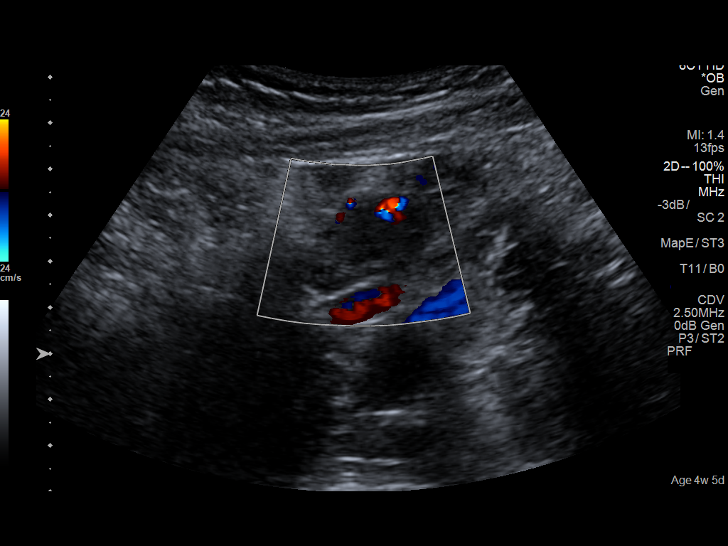
[im 9/34]
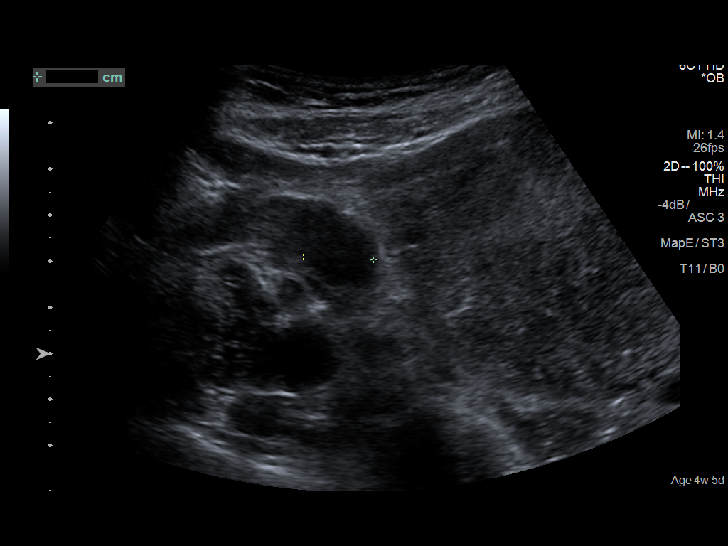
[im 12/34]
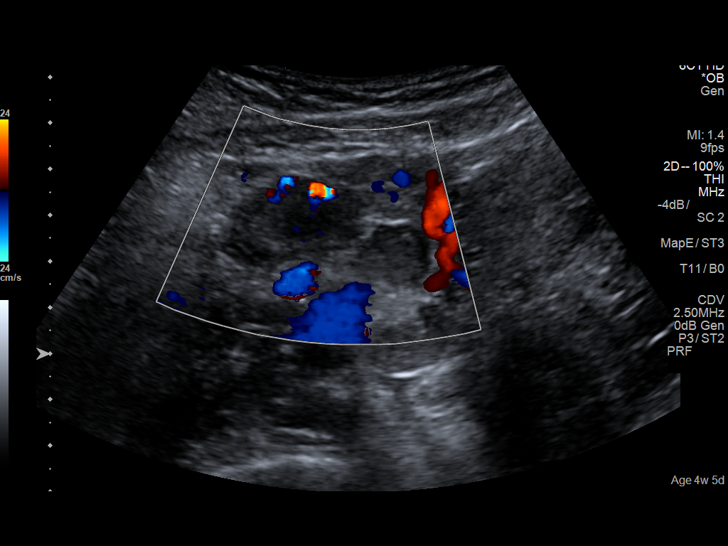
[im 14/34]
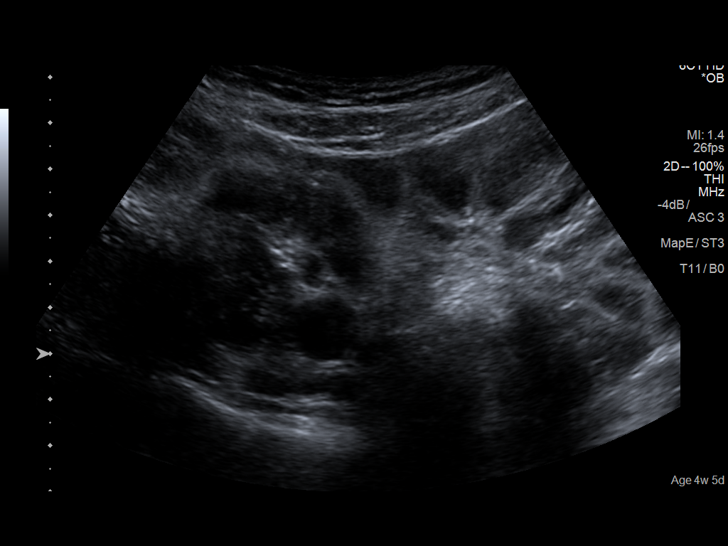
[im 16/34]
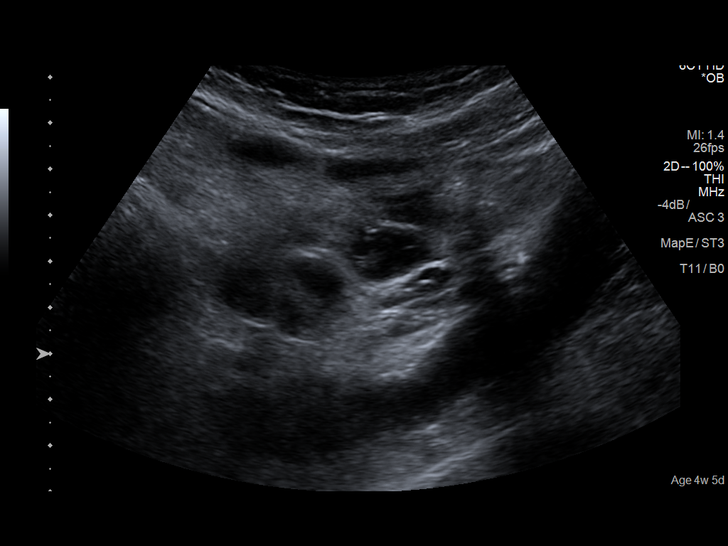
[im 19/34]
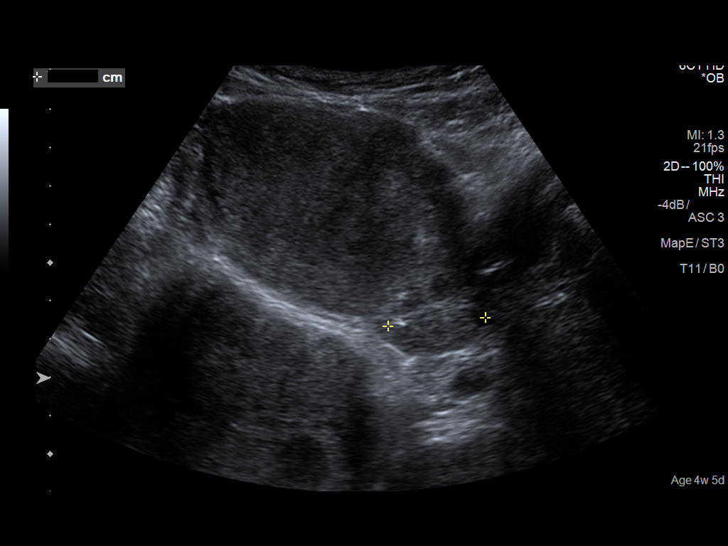
[im 21/34]
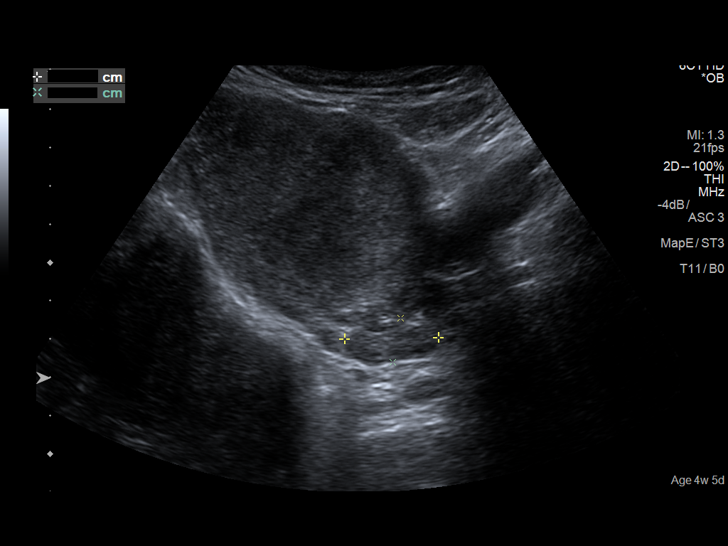
[im 24/34]
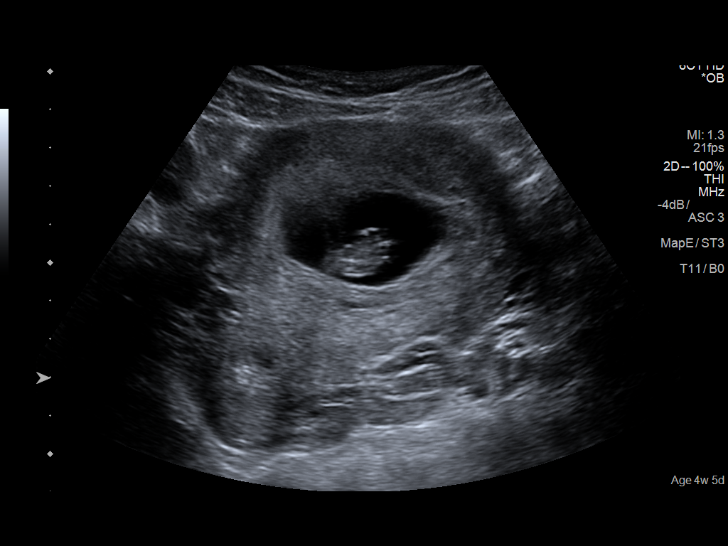
[im 26/34]
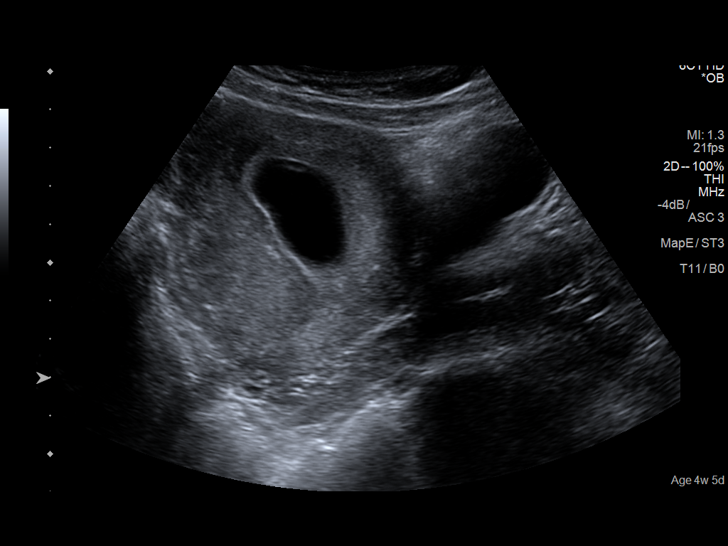
[im 29/34]
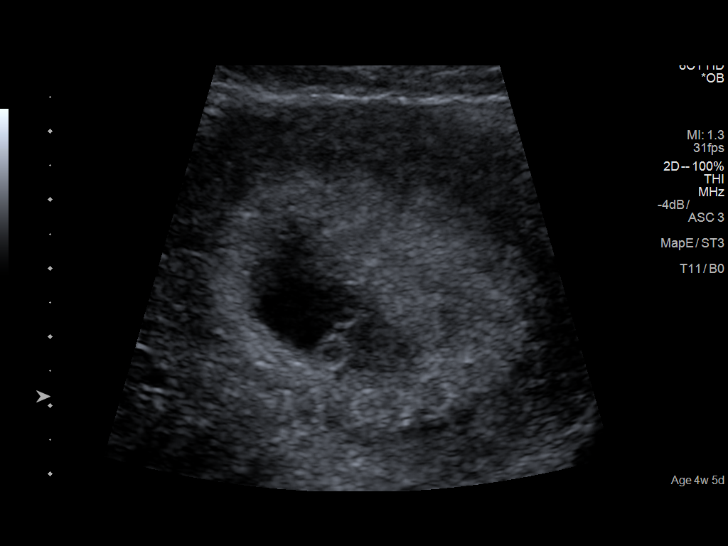
[im 31/34]
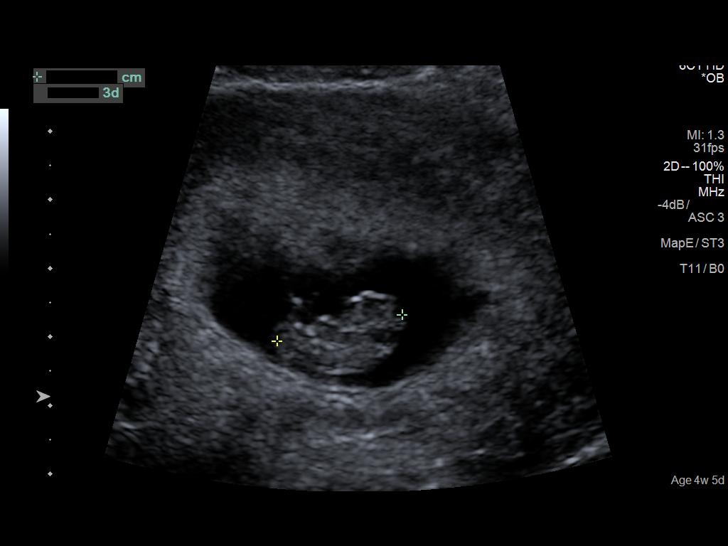
[im 34/34]
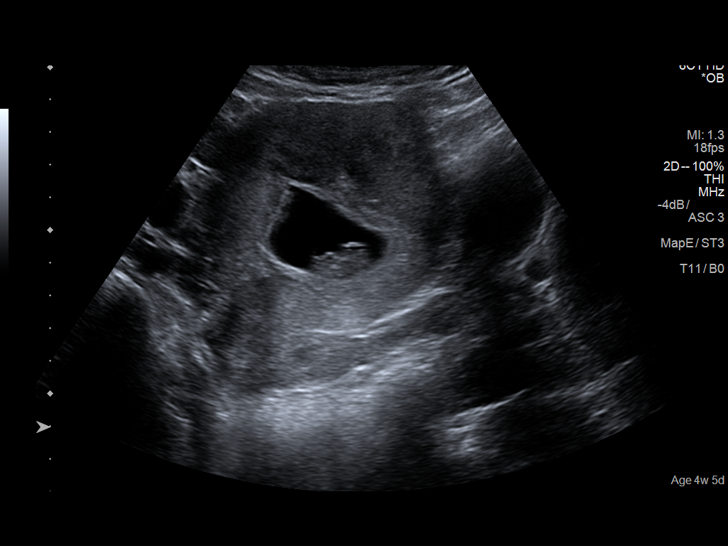

[14 of 28 positions shown; findings below may reference images not displayed]

FINDINGS: Intrauterine gestational sac: A single intrauterine pregnancy is
identified.

Yolk sac:  Yolk sac is present.

Embryo:  Fetal pole is present.

Cardiac Activity: Fetal cardiac activity is observed.

Heart Rate: 153 bpm

CRL:   18.6  mm   8 w 3 d                  US EDC: 02/27/2016

Subchorionic hemorrhage:  None visualized.

Maternal uterus/adnexae: Uterus is anteverted. No myometrial mass
lesions identified. Both ovaries are visualized and appear normal.
Corpus luteal cyst on the right ovary. No abnormal adnexal mass
lesions. No free fluid in the pelvis.
IMPRESSION: Single intrauterine pregnancy is identified. Estimated gestational
age by crown-rump length is 8 weeks 3 days. No acute complication
demonstrated.

## 2018-06-07 ENCOUNTER — Other Ambulatory Visit: Payer: Self-pay | Admitting: Obstetrics & Gynecology

## 2018-06-07 DIAGNOSIS — Z3041 Encounter for surveillance of contraceptive pills: Secondary | ICD-10-CM

## 2018-06-08 ENCOUNTER — Other Ambulatory Visit: Payer: Self-pay

## 2018-06-08 DIAGNOSIS — Z3041 Encounter for surveillance of contraceptive pills: Secondary | ICD-10-CM

## 2018-06-08 MED ORDER — NORGESTIMATE-ETH ESTRADIOL 0.25-35 MG-MCG PO TABS: 1.0000 | ORAL_TABLET | Freq: Every day | ORAL | 5 refills | Status: DC

## 2018-11-13 ENCOUNTER — Ambulatory Visit: Payer: Medicaid Other | Admitting: Obstetrics & Gynecology

## 2018-12-10 ENCOUNTER — Telehealth: Payer: Self-pay | Admitting: Obstetrics & Gynecology

## 2018-12-10 NOTE — Telephone Encounter (Signed)
Spoke to patient about her appointment on 9/25 @ 8:55. Patient instructed to wear a face mask for the entire appointment and no visitors are allowed with her during the visit. Patient screened for covid symptoms and denied having any °

## 2018-12-11 ENCOUNTER — Ambulatory Visit (INDEPENDENT_AMBULATORY_CARE_PROVIDER_SITE_OTHER): Payer: Medicaid Other | Admitting: Obstetrics & Gynecology

## 2018-12-11 ENCOUNTER — Encounter: Payer: Self-pay | Admitting: Obstetrics & Gynecology

## 2018-12-11 ENCOUNTER — Other Ambulatory Visit (HOSPITAL_COMMUNITY)
Admission: RE | Admit: 2018-12-11 | Discharge: 2018-12-11 | Disposition: A | Discharge status: Home / Self Care | Payer: Medicaid Other | Source: Ambulatory Visit | Attending: Obstetrics & Gynecology | Admitting: Obstetrics & Gynecology

## 2018-12-11 ENCOUNTER — Other Ambulatory Visit: Payer: Self-pay

## 2018-12-11 VITALS — BP 136/89 | HR 84 | Wt 184.0 lb

## 2018-12-11 DIAGNOSIS — Z0000 Encounter for general adult medical examination without abnormal findings: Secondary | ICD-10-CM | POA: Diagnosis not present

## 2018-12-11 DIAGNOSIS — Z01419 Encounter for gynecological examination (general) (routine) without abnormal findings: Secondary | ICD-10-CM | POA: Insufficient documentation

## 2018-12-11 NOTE — Progress Notes (Signed)
Subjective:    [name] is a 28 y.o. divorced P3 (2, 68, and 22 yo kids) who presents for an annual exam. She thinks that her Bartholin's has returned on the right side. I did an I&D last year on the right.  The patient is not currently sexually active. GYN screening history: last pap: was normal. The patient wears seatbelts: yes. The patient participates in regular exercise: yes. Has the patient ever been transfused or tattooed?: yes. The patient reports that there is not domestic violence in her life.   Menstrual History: OB History    Gravida  6   Para  5   Term  5   Preterm  0   AB  0   Living  3     SAB  0   TAB  0   Ectopic  0   Multiple      Live Births  3           Menarche age: 65 No LMP recorded. Patient has had an implant.    The following portions of the patient's history were reviewed and updated as appropriate: allergies, current medications, past family history, past medical history, past social history, past surgical history and problem list.  Review of Systems Pertinent items are noted in HPI.   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 She has irregular periods with Nexplanon (expires in December 2020) Objective:    BP 136/89   Pulse 84   Wt 184 lb (83.5 kg)   BMI 28.82 kg/m   General Appearance:    Alert, cooperative, no distress, appears stated age  Head:    Normocephalic, without obvious abnormality, atraumatic  Eyes:    PERRL, conjunctiva/corneas clear, EOM's intact, fundi    benign, both eyes  Ears:    Normal TM's and external ear canals, both ears  Nose:   Nares normal, septum midline, mucosa normal, no drainage    or sinus tenderness  Throat:   Lips, mucosa, and tongue normal; teeth and gums normal  Neck:   Supple, symmetrical, trachea midline, no adenopathy;    thyroid:  no enlargement/tenderness/nodules; no carotid   bruit or JVD  Back:      Symmetric, no curvature, ROM normal, no CVA tenderness  Lungs:     Clear to auscultation bilaterally, respirations unlabored  Chest Wall:    No tenderness or deformity   Heart:    Regular rate and rhythm, S1 and S2 normal, no murmur, rub   or gallop  Breast Exam:    No tenderness, masses, or nipple abnormality  Abdomen:     Soft, non-tender, bowel sounds active all four quadrants,    no masses, no organomegaly  Genitalia:    Normal female without lesion, discharge or tenderness, normal size and shape, anteverted, mobile, non-tender, normal adnexal exam      Extremities:   Extremities normal, atraumatic, no cyanosis or edema  Pulses:   2+ and symmetric all extremities  Skin:   Skin color, texture, turgor normal, no rashes or lesions  Lymph nodes:   Cervical, supraclavicular, and axillary nodes normal  Neurologic:   CNII-XII intact, normal strength, sensation and reflexes    throughout  .    Assessment:    Healthy female exam.    Plan:     Thin prep Pap smear. with STI testing per patient request She declines flu vaccine

## 2018-12-11 NOTE — Progress Notes (Signed)
Thinks her barth cyst is coming back.

## 2018-12-14 LAB — CERVICOVAGINAL ANCILLARY ONLY
Bacterial Vaginitis (gardnerella): POSITIVE — AB
Candida Glabrata: NEGATIVE
Candida Vaginitis: NEGATIVE
Molecular Disclaimer: NEGATIVE
Molecular Disclaimer: NEGATIVE
Molecular Disclaimer: NEGATIVE
Molecular Disclaimer: NORMAL
Trichomonas: POSITIVE — AB

## 2018-12-15 ENCOUNTER — Encounter: Payer: Self-pay | Admitting: Obstetrics & Gynecology

## 2018-12-15 ENCOUNTER — Other Ambulatory Visit: Payer: Self-pay | Admitting: Obstetrics & Gynecology

## 2018-12-15 DIAGNOSIS — A599 Trichomoniasis, unspecified: Secondary | ICD-10-CM | POA: Insufficient documentation

## 2018-12-15 LAB — CYTOLOGY - PAP
Adequacy: ABSENT
Diagnosis: NEGATIVE
High risk HPV: NEGATIVE
Molecular Disclaimer: 56
Molecular Disclaimer: NORMAL

## 2018-12-15 MED ORDER — METRONIDAZOLE 500 MG PO TABS: ORAL_TABLET | ORAL | 0 refills | Status: DC

## 2018-12-15 NOTE — Progress Notes (Signed)
Flagyl prescribed to treat trich and bv Pt will be notified.

## 2019-03-22 ENCOUNTER — Ambulatory Visit: Payer: Medicaid Other | Admitting: Obstetrics & Gynecology

## 2019-04-15 ENCOUNTER — Encounter: Payer: Self-pay | Admitting: Obstetrics & Gynecology

## 2019-04-15 ENCOUNTER — Ambulatory Visit (INDEPENDENT_AMBULATORY_CARE_PROVIDER_SITE_OTHER): Payer: Medicaid Other | Admitting: Obstetrics & Gynecology

## 2019-04-15 ENCOUNTER — Other Ambulatory Visit: Payer: Self-pay

## 2019-04-15 VITALS — BP 138/89 | HR 76 | Ht 67.0 in | Wt 161.0 lb

## 2019-04-15 DIAGNOSIS — Z3042 Encounter for surveillance of injectable contraceptive: Secondary | ICD-10-CM

## 2019-04-15 DIAGNOSIS — Z3041 Encounter for surveillance of contraceptive pills: Secondary | ICD-10-CM

## 2019-04-15 DIAGNOSIS — Z3046 Encounter for surveillance of implantable subdermal contraceptive: Secondary | ICD-10-CM

## 2019-04-15 DIAGNOSIS — Z3202 Encounter for pregnancy test, result negative: Secondary | ICD-10-CM

## 2019-04-15 LAB — POCT PREGNANCY, URINE: Preg Test, Ur: NEGATIVE

## 2019-04-15 MED ORDER — NORGESTIMATE-ETH ESTRADIOL 0.25-35 MG-MCG PO TABS: 1.0000 | ORAL_TABLET | Freq: Every day | ORAL | 5 refills | Status: DC

## 2019-04-15 MED ORDER — MEDROXYPROGESTERONE ACETATE 150 MG/ML IM SUSP
150.0000 mg | Freq: Once | INTRAMUSCULAR | Status: AC
  Administered 2019-04-15: 150 mg via INTRAMUSCULAR

## 2019-04-15 NOTE — Addendum Note (Signed)
Addended by: Kathee Delton on: 04/15/2019 08:49 AM   Modules accepted: Orders

## 2019-04-15 NOTE — Progress Notes (Signed)
   Subjective:    Patient ID: [name], female    DOB: [birth year], 29 y.o.   MRN: [id number]  HPI 29 yo P3 here to have her Nexplanon removed. She wants to change her method to depo provera. She currently takes OCPs prn for irregular bleeding. At one time she wanted a BTL and signed the medicaid forms but didn't get it done. She is not sure that she wants that method at this point.   Review of Systems Pap normal 9/20    Objective:   Physical Exam Breathing, conversing, and ambulating normally. Well nourished, well hydrated Black female, no apparent distress Consent was signed and time out was done. Her left arm was prepped with betadine after establishing the position of the Nexplanon. The area was infiltrated with 2 cc of 1% lidocaine. A small incision was made and the intact rod was easily removed and noted to be intact.  A steristrip was placed and her arm was noted to be hemostatic. It was bandaged.  She tolerated the procedure well.      Assessment & Plan:  Contraception- depo provera today and q 12 weeks OCPs prn bleeding

## 2019-05-11 ENCOUNTER — Ambulatory Visit (INDEPENDENT_AMBULATORY_CARE_PROVIDER_SITE_OTHER): Payer: Medicaid Other

## 2019-05-11 ENCOUNTER — Other Ambulatory Visit: Payer: Self-pay

## 2019-05-11 DIAGNOSIS — Z111 Encounter for screening for respiratory tuberculosis: Secondary | ICD-10-CM | POA: Diagnosis not present

## 2019-05-11 NOTE — Progress Notes (Signed)
Patient presents in nurse clinic for PPD skin test. PPD applied to left forearm. Patient tolerated well. Patient scheduled to return to nurse clinic for skin read on 02/25, reminder card given.

## 2019-05-13 ENCOUNTER — Ambulatory Visit: Payer: Medicaid Other

## 2019-05-13 ENCOUNTER — Other Ambulatory Visit: Payer: Self-pay

## 2019-05-13 DIAGNOSIS — Z111 Encounter for screening for respiratory tuberculosis: Secondary | ICD-10-CM

## 2019-05-13 NOTE — Patient Instructions (Signed)
Patient here today (05/13/2019) to have PPD site read.   PPD read and results entered in EpicCare. Result: 0 mm induration. Interpretation: Negative Sunday Spillers, CMA

## 2019-05-13 NOTE — Progress Notes (Signed)
Patient here today to have PPD site read.   PPD read and results entered in EpicCare. Result: 0 mm induration. Interpretation: Negative  Letter faxed to employer, [employer] and [facility] [employer] [facility] Fax [phone].  Sunday Spillers, CMA

## 2019-05-27 ENCOUNTER — Encounter: Payer: Self-pay | Admitting: General Practice

## 2019-07-01 ENCOUNTER — Ambulatory Visit (INDEPENDENT_AMBULATORY_CARE_PROVIDER_SITE_OTHER): Payer: Medicaid Other | Admitting: *Deleted

## 2019-07-01 ENCOUNTER — Encounter: Payer: Self-pay | Admitting: *Deleted

## 2019-07-01 ENCOUNTER — Other Ambulatory Visit: Payer: Self-pay

## 2019-07-01 VITALS — BP 141/83 | HR 60 | Temp 98.6°F | Ht 67.0 in | Wt 165.2 lb

## 2019-07-01 DIAGNOSIS — Z3042 Encounter for surveillance of injectable contraceptive: Secondary | ICD-10-CM | POA: Diagnosis present

## 2019-07-01 DIAGNOSIS — R030 Elevated blood-pressure reading, without diagnosis of hypertension: Secondary | ICD-10-CM

## 2019-07-01 MED ORDER — MEDROXYPROGESTERONE ACETATE 150 MG/ML IM SUSP
150.0000 mg | Freq: Once | INTRAMUSCULAR | Status: AC
  Administered 2019-07-01: 09:00:00 150 mg via INTRAMUSCULAR

## 2019-07-01 NOTE — Progress Notes (Signed)
Chart reviewed for nurse visit. Agree with plan of care.   Duane Lope, NP 07/01/2019 5:24 PM

## 2019-07-01 NOTE — Progress Notes (Signed)
Depo Provera 150 mg IM administered as scheduled. Pt tolerated well. Next dose due 7/1 - 7/15.  Discussed pt's elevated Bp with her and she stated that she has high BP but does not take medication. Pt also has edema of lower extremities today which she stated has just started. She stands all [employer] @ work. Pt was advised to use compression socks and schedule appt w/PCP for follow up of BP. Pt was warned that high BP can become worse without her realizing due to she may not notice symptoms.  Pt voiced understanding.

## 2019-09-16 ENCOUNTER — Other Ambulatory Visit: Payer: Self-pay

## 2019-09-16 ENCOUNTER — Encounter: Payer: Self-pay | Admitting: *Deleted

## 2019-09-16 ENCOUNTER — Ambulatory Visit (INDEPENDENT_AMBULATORY_CARE_PROVIDER_SITE_OTHER): Payer: Medicaid Other | Admitting: *Deleted

## 2019-09-16 VITALS — BP 130/78 | HR 72 | Ht 67.0 in | Wt 167.9 lb

## 2019-09-16 DIAGNOSIS — Z3042 Encounter for surveillance of injectable contraceptive: Secondary | ICD-10-CM | POA: Diagnosis not present

## 2019-09-16 MED ORDER — MEDROXYPROGESTERONE ACETATE 150 MG/ML IM SUSP
150.0000 mg | Freq: Once | INTRAMUSCULAR | Status: AC
  Administered 2019-09-16: 150 mg via INTRAMUSCULAR

## 2019-09-16 NOTE — Progress Notes (Signed)
Chart reviewed for nurse visit. Agree with plan of care.   Marny Lowenstein, PA-C 09/16/2019 1:29 PM

## 2019-09-16 NOTE — Progress Notes (Signed)
Depo provera 150 mg IM administered as scheduled. Pt tolerated well. Next injection due 9/16-9/30. Since last visit on 4/15, pt has been wearing compression socks as recommended and has noticed a decrease in edema of LLE. In regards to previous elevated BP, she has also been "watching what she eats" and did not schedule appt w/PCP as recommended. Pt was advised she will need Annual gyn exam after 12/11/19. Also per chart review, pt has not had Annual physical exam with PCP in more than 2 years. She was strongly advised to schedule that appt. Pt voiced understanding of all information and instructions given.

## 2019-10-06 ENCOUNTER — Other Ambulatory Visit: Payer: Self-pay | Admitting: Obstetrics & Gynecology

## 2019-10-06 DIAGNOSIS — Z3041 Encounter for surveillance of contraceptive pills: Secondary | ICD-10-CM

## 2019-10-07 ENCOUNTER — Other Ambulatory Visit: Payer: Self-pay | Admitting: Obstetrics & Gynecology

## 2019-10-07 DIAGNOSIS — Z3041 Encounter for surveillance of contraceptive pills: Secondary | ICD-10-CM

## 2019-10-07 MED ORDER — NORGESTIMATE-ETH ESTRADIOL 0.25-35 MG-MCG PO TABS: 1.0000 | ORAL_TABLET | Freq: Every day | ORAL | 5 refills | Status: DC

## 2019-10-11 ENCOUNTER — Encounter: Payer: Self-pay | Admitting: Family Medicine

## 2019-10-11 ENCOUNTER — Other Ambulatory Visit: Payer: Self-pay

## 2019-10-11 ENCOUNTER — Ambulatory Visit (INDEPENDENT_AMBULATORY_CARE_PROVIDER_SITE_OTHER): Payer: Self-pay | Admitting: Family Medicine

## 2019-10-11 VITALS — BP 122/70 | HR 87 | Ht 67.0 in | Wt 173.0 lb

## 2019-10-11 DIAGNOSIS — Z0000 Encounter for general adult medical examination without abnormal findings: Secondary | ICD-10-CM

## 2019-10-11 NOTE — Progress Notes (Signed)
    SUBJECTIVE:   CHIEF COMPLAINT / HPI: Work Physical  Patient has no concerns today.  Here for work Physical for Work for new job working at [employer]  Patient indicates she had injury of right big toe a month ago.  Is not tender at this time and is healing well.  Patient also indicates she has had scoliosis of spine since birth.  Indicates it does not limit her ability such as bending down and picking things up.  PERTINENT  PMH / PSH: Scoliosis  OBJECTIVE:   BP 122/70   Pulse 87   Ht 5\' 7"  (1.702 m)   Wt 173 lb (78.5 kg)   LMP 10/08/2019   SpO2 97%   BMI 27.10 kg/m    Physical Exam Constitutional:      General: She is not in acute distress.    Appearance: Normal appearance.  HENT:     Head: Normocephalic.     Mouth/Throat:     Mouth: Mucous membranes are moist.     Pharynx: Oropharynx is clear. No oropharyngeal exudate or posterior oropharyngeal erythema.  Eyes:     General: No scleral icterus.    Extraocular Movements: Extraocular movements intact.     Conjunctiva/sclera: Conjunctivae normal.     Pupils: Pupils are equal, round, and reactive to light.  Cardiovascular:     Rate and Rhythm: Normal rate and regular rhythm.  Pulmonary:     Effort: Pulmonary effort is normal. No respiratory distress.     Breath sounds: Normal breath sounds.  Abdominal:     General: Abdomen is flat. Bowel sounds are normal. There is no distension.     Palpations: Abdomen is soft.     Tenderness: There is no abdominal tenderness.  Musculoskeletal:        General: Normal range of motion.     Lumbar back: No swelling or tenderness. Scoliosis present.     Comments: Significant lumbar scoliosis with leftward deviation, no limit in ROM, tenderness to palpation or weakness  Skin:    General: Skin is warm.     Capillary Refill: Capillary refill takes less than 2 seconds.     Findings: Bruising present.     Nails: There is no clubbing.     Comments: Bruising of right big toe under nail,  non-tender to palpation  Neurological:     General: No focal deficit present.     Mental Status: She is alert and oriented to person, place, and time.     Cranial Nerves: No cranial nerve deficit.     Sensory: No sensory deficit.     Motor: No weakness.     Coordination: Coordination normal.     Gait: Gait normal.  Psychiatric:        Mood and Affect: Mood normal.        Behavior: Behavior normal.     ASSESSMENT/PLAN:   No problem-specific Assessment & Plan notes found for this encounter.   Health Maintenance No concerns at this time.  Patient cleared for work. - Filled out work form  - Forgot to print AVS out to give to patient - Indicated for patient to return in toe began bothering her or looked significantly worse or return for any other issue - Patient declined Hepatitis C screen at this time, will obtain at future visit  10/10/2019, MD [facility]

## 2019-10-11 NOTE — Patient Instructions (Addendum)
It was good to see you today.  Thank you for coming in.  I believe you are fine to be cleared for work.  It appears you have a bruised toe.  Please return if toe becomes painful, begins to swell or any other issues.  You are not interested in Hepatitis C screen at this time.  Can discuss Hep C screen at later date  Be Well, Jovita Kussmaul MD

## 2019-11-10 ENCOUNTER — Ambulatory Visit (HOSPITAL_COMMUNITY)
Admission: EM | Admit: 2019-11-10 | Discharge: 2019-11-10 | Disposition: A | Discharge status: Home / Self Care | Payer: Medicaid Other | Attending: Emergency Medicine | Admitting: Emergency Medicine

## 2019-11-10 ENCOUNTER — Other Ambulatory Visit: Payer: Self-pay

## 2019-11-10 ENCOUNTER — Encounter (HOSPITAL_COMMUNITY): Payer: Self-pay

## 2019-11-10 DIAGNOSIS — N390 Urinary tract infection, site not specified: Secondary | ICD-10-CM | POA: Insufficient documentation

## 2019-11-10 DIAGNOSIS — R300 Dysuria: Secondary | ICD-10-CM | POA: Diagnosis present

## 2019-11-10 DIAGNOSIS — I10 Essential (primary) hypertension: Secondary | ICD-10-CM | POA: Insufficient documentation

## 2019-11-10 DIAGNOSIS — Z3202 Encounter for pregnancy test, result negative: Secondary | ICD-10-CM

## 2019-11-10 DIAGNOSIS — M791 Myalgia, unspecified site: Secondary | ICD-10-CM

## 2019-11-10 DIAGNOSIS — Z113 Encounter for screening for infections with a predominantly sexual mode of transmission: Secondary | ICD-10-CM | POA: Diagnosis not present

## 2019-11-10 DIAGNOSIS — Z87440 Personal history of urinary (tract) infections: Secondary | ICD-10-CM | POA: Diagnosis not present

## 2019-11-10 LAB — POCT URINALYSIS DIPSTICK, ED / UC
Glucose, UA: 250 mg/dL — AB
Hgb urine dipstick: NEGATIVE
Ketones, ur: 15 mg/dL — AB
Nitrite: POSITIVE — AB
Protein, ur: >=300 mg/dL — AB
Specific Gravity, Urine: 1.015 (ref 1.005–1.030)
Urobilinogen, UA: >=8 mg/dL (ref 0.0–1.0)
pH: 5 (ref 5.0–8.0)

## 2019-11-10 LAB — POC URINE PREG, ED: Preg Test, Ur: NEGATIVE

## 2019-11-10 MED ORDER — NITROFURANTOIN MONOHYD MACRO 100 MG PO CAPS: 100.0000 mg | ORAL_CAPSULE | Freq: Two times a day (BID) | ORAL | 0 refills | Status: AC

## 2019-11-10 NOTE — ED Provider Notes (Signed)
[facility]    CSN: [id number] Arrival date & time: 11/10/19  0907      History   Chief Complaint Chief Complaint  Patient presents with   Generalized Body Aches   Dysuria    HPI [name] is a 29 y.o. female.   [name] presents with complaints of back pain, subjective fever as well as pain with urination. No urinary frequency. No current back pain. No visible blood to urine. Took OTC AZO, took it today, hasn't helped with pain. Has had similar in the past with UTI's, as well as history of kidney infection. This does not feel as severe as when she had kidney infection. No nausea or vomiting. No vaginal symptoms. Requests std screening, no known exposures. 1 partner, doesn't use condoms.    ROS per HPI, negative if not otherwise mentioned.      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 Authorizing Provider  nitrofurantoin, macrocrystal-monohydrate, (MACROBID) 100 MG capsule Take 1 capsule (100 mg total) by mouth 2 (two) times daily for 5 days. 11/10/19 11/15/19  Georgetta Haber, NP  norgestimate-ethinyl estradiol (ESTARYLLA) 0.25-35 MG-MCG tablet Take 1 tablet by mouth daily. 10/07/19   Tereso Newcomer, MD    Family History Family History  Problem Relation Age of Onset   Cancer Maternal Uncle    Anemia Mother    Migraines Mother    Mitral valve prolapse Sister    Heart murmur Brother    Heart attack Maternal Grandmother    Mitral valve prolapse Maternal Grandmother    Heart disease Maternal Grandmother    Diabetes Maternal Grandmother    Hyperlipidemia Maternal Grandmother    Hypertension Maternal Grandmother     Social History Social History   Tobacco Use   Smoking status: Never Smoker   Smokeless tobacco: Never Used  Building services engineer Use: Never used  Substance Use Topics   Alcohol use: No   Drug use: No     Allergies   Patient has no known allergies.   Review of Systems Review of  Systems   Physical Exam Triage Vital Signs ED Triage Vitals  Enc Vitals Group     BP 11/10/19 1020 128/84     Pulse Rate 11/10/19 1020 90     Resp 11/10/19 1020 16     Temp 11/10/19 1020 98.8 F (37.1 C)     Temp Source 11/10/19 1020 Oral     SpO2 11/10/19 1020 100 %     Weight --      Height --      Head Circumference --      Peak Flow --      Pain Score 11/10/19 1024 5     Pain Loc --      Pain Edu? --      Excl. in GC? --    No data found.  Updated Vital Signs BP 128/84 (BP Location: Right Arm)    Pulse 90    Temp 98.8 F (37.1 C) (Oral)    Resp 16    LMP  (LMP Unknown)    SpO2 100%    Physical Exam Constitutional:      General: She is not in acute distress.    Appearance: She is well-developed.  Cardiovascular:     Rate and Rhythm: Normal rate.  Pulmonary:     Effort: Pulmonary effort is normal.  Abdominal:      Tenderness: There is no abdominal tenderness. There is no right CVA tenderness or left CVA tenderness.  Skin:    General: Skin is warm and dry.  Neurological:     Mental Status: She is alert and oriented to person, place, and time.      UC Treatments / Results  Labs (all labs ordered are listed, but only abnormal results are displayed) Labs Reviewed  POCT URINALYSIS DIPSTICK, ED / UC - Abnormal; Notable for the following components:      Result Value   Glucose, UA 250 (*)    Bilirubin Urine MODERATE (*)    Ketones, ur 15 (*)    Protein, ur >=300 (*)    Nitrite POSITIVE (*)    Leukocytes,Ua LARGE (*)    All other components within normal limits  URINE CULTURE  POC URINE PREG, ED  CERVICOVAGINAL ANCILLARY ONLY    EKG   Radiology No results found.  Procedures Procedures (including critical care time)  Medications Ordered in UC Medications - No data to display  Initial Impression / Assessment and Plan / UC Course  I have reviewed the triage vital signs and the nursing notes.  Pertinent labs & imaging results that were available during my care of the patient were reviewed by me and considered in my medical decision making (see chart for details).     UA consistent with UTI with macrobid initiated. Culture pending. Return precautions provided. Patient verbalized understanding and agreeable to plan.   Final Clinical Impressions(s) / UC Diagnoses   Final diagnoses:  Lower urinary tract infectious disease     Discharge Instructions     Drink plenty of water to empty bladder regularly. Avoid alcohol and caffeine as these may irritate the bladder.   Complete course of antibiotics.  We will notify of you any positive findings or if any changes to treatment are needed. If normal or otherwise without concern to your results, we will not call you. Please log on to your MyChart to review your results if interested in so.      ED Prescriptions    Medication Sig  Dispense Auth. Provider  nitrofurantoin, macrocrystal-monohydrate, (MACROBID) 100 MG capsule Take 1 capsule (100 mg total) by mouth 2 (two) times daily for 5 days. 10 capsule Georgetta Haber, NP     PDMP not reviewed this encounter.   Georgetta Haber, NP 11/10/19 1055

## 2019-11-10 NOTE — ED Triage Notes (Addendum)
Pt c/o painful urination, body aches and feeling "warm" since yesterday. Pt requesting STD testing.

## 2019-11-10 NOTE — Discharge Instructions (Signed)
Drink plenty of water to empty bladder regularly. Avoid alcohol and caffeine as these may irritate the bladder.   Complete course of antibiotics.  We will notify of you any positive findings or if any changes to treatment are needed. If normal or otherwise without concern to your results, we will not call you. Please log on to your MyChart to review your results if interested in so.

## 2019-11-11 ENCOUNTER — Telehealth: Payer: Self-pay | Admitting: Family Medicine

## 2019-11-11 LAB — URINE CULTURE: Culture: 20000 — AB

## 2019-11-11 LAB — CERVICOVAGINAL ANCILLARY ONLY
Bacterial Vaginitis (gardnerella): POSITIVE — AB
Candida Glabrata: NEGATIVE
Candida Vaginitis: NEGATIVE
Chlamydia: NEGATIVE
Comment: NEGATIVE
Comment: NEGATIVE
Comment: NEGATIVE
Comment: NEGATIVE
Comment: NEGATIVE
Comment: NORMAL
Neisseria Gonorrhea: POSITIVE — AB
Trichomonas: POSITIVE — AB

## 2019-11-11 NOTE — Telephone Encounter (Signed)
She should receive a call and receive treatment through urgent care where she was evaluated at yesterday.  Are we full in nurses visits this week if she needs to receive treatment through our clinic?   Allayne Stack, DO

## 2019-11-11 NOTE — Telephone Encounter (Signed)
Patient seen at [facility] yesterday and got positive results back today. We are completely booked this week, so what should we do next?

## 2019-11-12 ENCOUNTER — Encounter (HOSPITAL_COMMUNITY): Payer: Self-pay

## 2019-11-12 ENCOUNTER — Telehealth (HOSPITAL_COMMUNITY): Payer: Self-pay | Admitting: Emergency Medicine

## 2019-11-12 ENCOUNTER — Ambulatory Visit (HOSPITAL_COMMUNITY)
Admission: EM | Admit: 2019-11-12 | Discharge: 2019-11-12 | Disposition: A | Discharge status: Home / Self Care | Payer: Medicaid Other | Attending: Internal Medicine | Admitting: Internal Medicine

## 2019-11-12 ENCOUNTER — Other Ambulatory Visit: Payer: Self-pay

## 2019-11-12 DIAGNOSIS — A549 Gonococcal infection, unspecified: Secondary | ICD-10-CM

## 2019-11-12 MED ORDER — CEFTRIAXONE SODIUM 500 MG IJ SOLR
INTRAMUSCULAR | Status: AC
  Filled 2019-11-12: qty 500

## 2019-11-12 MED ORDER — CEFTRIAXONE SODIUM 500 MG IJ SOLR
500.0000 mg | Freq: Once | INTRAMUSCULAR | Status: AC
  Administered 2019-11-12: 500 mg via INTRAMUSCULAR

## 2019-11-12 MED ORDER — METRONIDAZOLE 500 MG PO TABS: 500.0000 mg | ORAL_TABLET | Freq: Two times a day (BID) | ORAL | 0 refills | Status: DC

## 2019-11-12 NOTE — Telephone Encounter (Signed)
Pt positive for Gonorrhea, Trichomonas, and BV.  Will need to return for 500mg  IM Rocephin.  Flagyl 500mg  BID x 7 days sent to pharmacy for BV and Trichomonas.  Patient called and informed.  Safe sex practices, wait period after treatment, and notifying partners reviewed.  Refraining from alcohol on Flagyl reviewed.  Patient verbalized understanding.

## 2019-11-12 NOTE — ED Triage Notes (Signed)
Pt is here to be treated for Gonorrhea, pt is getting a shot of Rocephin 500mg .

## 2019-11-15 NOTE — Telephone Encounter (Signed)
Patient treated through Urgent Care 11/12/2019.  Glennie Hawk, CMA

## 2019-12-02 ENCOUNTER — Ambulatory Visit: Payer: Medicaid Other

## 2019-12-03 ENCOUNTER — Ambulatory Visit (INDEPENDENT_AMBULATORY_CARE_PROVIDER_SITE_OTHER): Payer: Medicaid Other | Admitting: *Deleted

## 2019-12-03 ENCOUNTER — Encounter: Payer: Self-pay | Admitting: *Deleted

## 2019-12-03 ENCOUNTER — Other Ambulatory Visit: Payer: Self-pay

## 2019-12-03 VITALS — BP 134/78 | HR 72 | Ht 67.0 in | Wt 176.4 lb

## 2019-12-03 DIAGNOSIS — Z3042 Encounter for surveillance of injectable contraceptive: Secondary | ICD-10-CM | POA: Diagnosis not present

## 2019-12-03 MED ORDER — MEDROXYPROGESTERONE ACETATE 150 MG/ML IM SUSP
150.0000 mg | Freq: Once | INTRAMUSCULAR | Status: AC
  Administered 2019-12-03: 150 mg via INTRAMUSCULAR

## 2019-12-03 NOTE — Progress Notes (Signed)
Depo Provera 150 mg IM administered as scheduled. Pt tolerated well. Next injection due 12/3-12/17. Pt needs Annual Gyn exam after 9/25 and will schedule appt today.

## 2019-12-06 NOTE — Progress Notes (Signed)
Chart reviewed for nurse visit. Agree with plan of care.   Marny Lowenstein, PA-C 12/06/2019 9:31 AM

## 2020-01-07 ENCOUNTER — Other Ambulatory Visit: Payer: Self-pay

## 2020-01-07 ENCOUNTER — Ambulatory Visit (INDEPENDENT_AMBULATORY_CARE_PROVIDER_SITE_OTHER): Payer: Medicaid Other | Admitting: Advanced Practice Midwife

## 2020-01-07 ENCOUNTER — Encounter: Payer: Self-pay | Admitting: Advanced Practice Midwife

## 2020-01-07 ENCOUNTER — Other Ambulatory Visit (HOSPITAL_COMMUNITY)
Admission: RE | Admit: 2020-01-07 | Discharge: 2020-01-07 | Disposition: A | Discharge status: Home / Self Care | Payer: Medicaid Other | Source: Ambulatory Visit | Attending: Advanced Practice Midwife | Admitting: Advanced Practice Midwife

## 2020-01-07 VITALS — BP 141/79 | HR 73 | Ht 67.0 in | Wt 180.2 lb

## 2020-01-07 DIAGNOSIS — Z01419 Encounter for gynecological examination (general) (routine) without abnormal findings: Secondary | ICD-10-CM

## 2020-01-07 DIAGNOSIS — Z113 Encounter for screening for infections with a predominantly sexual mode of transmission: Secondary | ICD-10-CM

## 2020-01-07 DIAGNOSIS — Z0000 Encounter for general adult medical examination without abnormal findings: Secondary | ICD-10-CM

## 2020-01-07 LAB — POCT URINALYSIS DIP (DEVICE)
Bilirubin Urine: NEGATIVE
Glucose, UA: NEGATIVE mg/dL
Ketones, ur: NEGATIVE mg/dL
Nitrite: NEGATIVE
Protein, ur: NEGATIVE mg/dL
Specific Gravity, Urine: 1.02 (ref 1.005–1.030)
Urobilinogen, UA: 0.2 mg/dL (ref 0.0–1.0)
pH: 6.5 (ref 5.0–8.0)

## 2020-01-07 NOTE — Patient Instructions (Signed)

## 2020-01-07 NOTE — Progress Notes (Signed)
GYNECOLOGY ANNUAL PREVENTATIVE CARE ENCOUNTER NOTE  Subjective:   [name] is a 29 y.o. G6P5003 female here for a routine annual gynecologic exam.  Current complaints: difficulty sleeping. Wakes up at 1:00am almost every night recently. Denies abnormal vaginal bleeding, discharge, pelvic pain, problems with intercourse or other gynecologic concerns.    Gynecologic History No LMP recorded. Patient has had an injection. Contraception: Depo-Provera injections patient is happy with this birth control and is not looking to switch at this time.  Last Pap: 2020. Results were: normal Last mammogram: NA, age. Results were: normal  Obstetric History OB History  Gravida Para Term Preterm AB Living  6 5 5  0 0 3  SAB TAB Ectopic Multiple Live Births  0 0 0   3    # Outcome Date GA Lbr Len/2nd Weight Sex Delivery Anes PTL Lv  6 Gravida           5 Term 02/09/16 [redacted]w[redacted]d 02:59 / 00:11 6 lb 9.6 oz (2.994 kg) M Vag-Spont EPI  LIV  4 Term 08/06/12 [redacted]w[redacted]d 01:53 / 00:04 6 lb 14.4 oz (3.13 kg) M Vag-Spont None  LIV     Birth Comments: No complications at birth per mom.   3 Term 03/20/11 [redacted]w[redacted]d 25:30 / 00:20 6 lb 4 oz (2.835 kg) F Vag-Spont Local  LIV     Birth Comments: none  2 Term           1 Term             Past Medical History:  Diagnosis Date   Anemia    CIN II (cervical intraepithelial neoplasia II)    Heart murmur    History of gestational hypertension    Hypertension    Scoliosis    Vaginal Pap smear, abnormal     Past Surgical History:  Procedure Laterality Date   LEEP  07/14/14   CIN II with negative margins   LEEP     cervical intraepithelial neoplasm    Current Outpatient Medications on File Prior to Visit  Medication Sig Dispense Refill   norgestimate-ethinyl estradiol (ESTARYLLA) 0.25-35 MG-MCG tablet Take 1 tablet by mouth daily. 84 tablet 5   metroNIDAZOLE (FLAGYL) 500 MG tablet Take 1 tablet (500 mg total) by mouth 2 (two) times daily. (Patient not taking:  Reported on 01/07/2020) 14 tablet 0   No current facility-administered medications on file prior to visit.    No Known Allergies  Social History   Socioeconomic History   Marital status: Single    Spouse name: Not on file   Number of children: Not on file   Years of education: Not on file   Highest education level: Not on file  Occupational History   Not on file  Tobacco Use   Smoking status: Never Smoker   Smokeless tobacco: Never Used  Vaping Use   Vaping Use: Never used  Substance and Sexual Activity   Alcohol use: No   Drug use: No   Sexual activity: Yes    Birth control/protection: None  Other Topics Concern   Not on file  Social History Narrative   ** Merged History Encounter **       Lives with husband Mallissa Lorenzen and daughter Jerilynn Birkenhead (2013). Is unemployed, completed some college.   Social Determinants of Health   Financial Resource Strain:    Difficulty of Paying Living Expenses: Not on file  Food Insecurity: No Food Insecurity   Worried About Running Out of Food in the Last  Year: Never true   Ran Out of Food in the Last Year: Never true  Transportation Needs: No Transportation Needs   Lack of Transportation (Medical): No   Lack of Transportation (Non-Medical): No  Physical Activity:    Days of Exercise per Week: Not on file   Minutes of Exercise per Session: Not on file  Stress:    Feeling of Stress : Not on file  Social Connections:    Frequency of Communication with Friends and Family: Not on file   Frequency of Social Gatherings with Friends and Family: Not on file   Attends Religious Services: Not on file   Active Member of Clubs or Organizations: Not on file   Attends Banker Meetings: Not on file   Marital Status: Not on file  Intimate Partner Violence:    Fear of Current or Ex-Partner: Not on file   Emotionally Abused: Not on file   Physically Abused: Not on file   Sexually Abused: Not on file     Family History  Problem Relation Age of Onset   Cancer Maternal Uncle    Anemia Mother    Migraines Mother    Mitral valve prolapse Sister    Heart murmur Brother    Heart attack Maternal Grandmother    Mitral valve prolapse Maternal Grandmother    Heart disease Maternal Grandmother    Diabetes Maternal Grandmother    Hyperlipidemia Maternal Grandmother    Hypertension Maternal Grandmother     The following portions of the patient's history were reviewed and updated as appropriate: allergies, current medications, past family history, past medical history, past social history, past surgical history and problem list.  Review of Systems Pertinent items noted in HPI and remainder of comprehensive ROS otherwise negative.   Objective:  BP (!) 141/79    Pulse 73    Ht 5\' 7"  (1.702 m)    Wt 180 lb 3.2 oz (81.7 kg)    BMI 28.22 kg/m  CONSTITUTIONAL: Well-developed, well-nourished female in no acute distress.  HENT:  Normocephalic, atraumatic, External right and left ear normal. Oropharynx is clear and moist EYES: Conjunctivae and EOM are normal. Pupils are equal, round, and reactive to light. No scleral icterus.  NECK: Normal range of motion, supple, no masses.  Normal thyroid.  SKIN: Skin is warm and dry. No rash noted. Not diaphoretic. No erythema. No pallor. NEUROLOGIC: Alert and oriented to person, place, and time. Normal reflexes, muscle tone coordination. No cranial nerve deficit noted. PSYCHIATRIC: Normal mood and affect. Normal behavior. Normal judgment and thought content. CARDIOVASCULAR: Normal heart rate noted, regular rhythm RESPIRATORY: Clear to auscultation bilaterally. Effort and breath sounds normal, no problems with respiration noted. BREASTS: Symmetric in size. No masses, skin changes, nipple drainage, or lymphadenopathy. ABDOMEN: Soft, normal bowel sounds, no distention noted.  No tenderness, rebound or guarding.  PELVIC: Normal appearing external  genitalia; normal appearing vaginal mucosa and cervix.  No abnormal discharge noted.  Pap smear obtained.  Normal uterine size, no other palpable masses, no uterine or adnexal tenderness. MUSCULOSKELETAL: Normal range of motion. No tenderness.  No cyanosis, clubbing, or edema.  2+ distal pulses.   Assessment and Plan:   1. Women's annual routine gynecological examination   2. Screen for STD (sexually transmitted disease)    - Pap - GC/CT - HIV/RPR/Hep B/Hep C   Will follow up results of pap smear and manage accordingly. Routine preventative health maintenance measures emphasized. Please refer to After Visit Summary for other counseling  recommendations.    Thressa Sheller DNP, CNM  01/07/20  9:18 AM

## 2020-01-07 NOTE — Progress Notes (Signed)
Inability to sleep  Pain in left leg numb and tingly  ([facility])  Here for pap and breast exam and STI Testing

## 2020-01-08 LAB — HEPATITIS C ANTIBODY: Hep C Virus Ab: <0.1 s/co ratio (ref 0.0–0.9)

## 2020-01-08 LAB — HEPATITIS B SURFACE ANTIGEN: Hepatitis B Surface Ag: NEGATIVE

## 2020-01-08 LAB — RPR: RPR Ser Ql: NONREACTIVE

## 2020-01-08 LAB — HIV ANTIBODY (ROUTINE TESTING W REFLEX): HIV Screen 4th Generation wRfx: NONREACTIVE

## 2020-01-10 LAB — CERVICOVAGINAL ANCILLARY ONLY
Bacterial Vaginitis (gardnerella): POSITIVE — AB
Candida Glabrata: NEGATIVE
Candida Vaginitis: NEGATIVE
Comment: NEGATIVE
Comment: NEGATIVE
Comment: NEGATIVE
Comment: NEGATIVE
Trichomonas: POSITIVE — AB

## 2020-01-10 LAB — CYTOLOGY - PAP
Chlamydia: NEGATIVE
Comment: NEGATIVE
Comment: NEGATIVE
Comment: NORMAL
Diagnosis: NEGATIVE
High risk HPV: NEGATIVE
Neisseria Gonorrhea: NEGATIVE

## 2020-01-11 MED ORDER — TINIDAZOLE 500 MG PO TABS
2.0000 g | ORAL_TABLET | Freq: Every day | ORAL | 0 refills | Status: AC
Start: 2020-01-11 — End: 2020-01-13

## 2020-02-18 ENCOUNTER — Ambulatory Visit (INDEPENDENT_AMBULATORY_CARE_PROVIDER_SITE_OTHER): Payer: Medicaid Other | Admitting: *Deleted

## 2020-02-18 ENCOUNTER — Other Ambulatory Visit: Payer: Self-pay

## 2020-02-18 VITALS — BP 134/86 | HR 75 | Ht 67.0 in | Wt 179.6 lb

## 2020-02-18 DIAGNOSIS — Z3042 Encounter for surveillance of injectable contraceptive: Secondary | ICD-10-CM | POA: Diagnosis not present

## 2020-02-18 MED ORDER — MEDROXYPROGESTERONE ACETATE 150 MG/ML IM SUSP
150.0000 mg | Freq: Once | INTRAMUSCULAR | Status: AC
  Administered 2020-02-18: 150 mg via INTRAMUSCULAR

## 2020-02-18 NOTE — Progress Notes (Signed)
Patient was assessed and managed by nursing staff during this encounter. I have reviewed the chart and agree with the documentation and plan. I have also made any necessary editorial changes.  Lanayah Gartley, MD 02/18/2020 11:24 AM 

## 2020-02-18 NOTE — Progress Notes (Signed)
[name] here for Depo-Provera Injection. Injection administered without complication. Patient will return in 3 months for next injection between 05/05/2020 and 05/19/2020. Next annual visit due 01/06/21.   Berkley Wrightsman,RN 02/18/2020  9:50 AM

## 2020-05-05 ENCOUNTER — Other Ambulatory Visit: Payer: Self-pay

## 2020-05-05 ENCOUNTER — Ambulatory Visit (INDEPENDENT_AMBULATORY_CARE_PROVIDER_SITE_OTHER): Payer: Medicaid Other

## 2020-05-05 VITALS — BP 144/93 | HR 90 | Wt 180.0 lb

## 2020-05-05 DIAGNOSIS — Z3042 Encounter for surveillance of injectable contraceptive: Secondary | ICD-10-CM

## 2020-05-05 MED ORDER — MEDROXYPROGESTERONE ACETATE 150 MG/ML IM SUSP
150.0000 mg | Freq: Once | INTRAMUSCULAR | Status: AC
  Administered 2020-05-05: 150 mg via INTRAMUSCULAR

## 2020-05-05 NOTE — Progress Notes (Signed)
[name] here for Depo-Provera Injection. Injection administered without complication. Patient will return in 3 months for next injection between 07/21/20 and 08/04/20. Next annual visit due after 01/07/20.   Pt reports 1 recent episode of night sweats; this has never occurred before. Encouraged pt to follow up with PCP if this happens again. BP is elevated today; pt has hx of elevated BP while in office, no hypertension diagnosis.  Marjo Bicker, RN 05/05/2020  8:46 AM

## 2020-05-08 NOTE — Progress Notes (Signed)
I have reviewed this chart and agree with the RN/CMA assessment and management.    K. Meryl Teja Judice, MD, FACOG [facility]  

## 2020-06-14 ENCOUNTER — Telehealth: Payer: Self-pay

## 2020-06-14 ENCOUNTER — Ambulatory Visit (INDEPENDENT_AMBULATORY_CARE_PROVIDER_SITE_OTHER): Payer: Medicaid Other

## 2020-06-14 ENCOUNTER — Other Ambulatory Visit: Payer: Self-pay

## 2020-06-14 DIAGNOSIS — Z111 Encounter for screening for respiratory tuberculosis: Secondary | ICD-10-CM | POA: Diagnosis not present

## 2020-06-14 NOTE — Telephone Encounter (Signed)
Staff Health Assessment/Medical Report form dropped off for at front desk for completion.  Verified that patient section of form has been completed.  Last DOS/WCC with PCP was 10/11/19.  Placed form in team folder to be completed by clinical staff.  [employer]

## 2020-06-14 NOTE — Progress Notes (Signed)
Patient presents in nurse clinic for PPD placement.  PPD placed left ventral forearm without complication.  Patient to return on 4/1 after 3:10pm to have site read. Patient to bring PPD screening paperwork with her for her employer.

## 2020-06-14 NOTE — Telephone Encounter (Signed)
Clinical info completed on work form.  Place form in Dr. Chauncey Mann box for completion.  Jarquez Mestre, CMA

## 2020-06-15 NOTE — Telephone Encounter (Signed)
Form completed and signed by PCP. Patient has a nurse visit scheduled for tomorrow for a TB read. Will plan to give form to her then.

## 2020-06-16 ENCOUNTER — Ambulatory Visit (INDEPENDENT_AMBULATORY_CARE_PROVIDER_SITE_OTHER): Payer: Medicaid Other

## 2020-06-16 ENCOUNTER — Other Ambulatory Visit: Payer: Self-pay

## 2020-06-16 DIAGNOSIS — Z111 Encounter for screening for respiratory tuberculosis: Secondary | ICD-10-CM

## 2020-06-16 LAB — TB SKIN TEST
Induration: 0 mm
TB Skin Test: NEGATIVE

## 2020-06-16 NOTE — Telephone Encounter (Signed)
Patient presents to nurse clinic for PPD read. Provided with forms.   Veronda Prude, RN

## 2020-06-16 NOTE — Progress Notes (Signed)
Patient is here for a PPD read.  It was placed on 06/14/2020 in the left forearm @ 3:10 pm.    PPD RESULTS:  Result: negative Induration: 0 mm  Letter created and given to patient for documentation purposes. Veronda Prude, RN

## 2020-07-18 ENCOUNTER — Ambulatory Visit (INDEPENDENT_AMBULATORY_CARE_PROVIDER_SITE_OTHER): Payer: BLUE CROSS/BLUE SHIELD

## 2020-07-18 ENCOUNTER — Other Ambulatory Visit: Payer: Self-pay

## 2020-07-18 DIAGNOSIS — Z23 Encounter for immunization: Secondary | ICD-10-CM | POA: Diagnosis not present

## 2020-07-25 ENCOUNTER — Other Ambulatory Visit: Payer: Self-pay

## 2020-07-25 ENCOUNTER — Ambulatory Visit (INDEPENDENT_AMBULATORY_CARE_PROVIDER_SITE_OTHER): Payer: BLUE CROSS/BLUE SHIELD

## 2020-07-25 DIAGNOSIS — Z30019 Encounter for initial prescription of contraceptives, unspecified: Secondary | ICD-10-CM | POA: Diagnosis not present

## 2020-07-25 MED ORDER — MEDROXYPROGESTERONE ACETATE 150 MG/ML IM SUSP
150.0000 mg | Freq: Once | INTRAMUSCULAR | Status: AC
  Administered 2020-07-25: 150 mg via INTRAMUSCULAR

## 2020-07-25 NOTE — Progress Notes (Signed)
Patient presents for depo provera injection and is within her dates.  Injection given RD per patient request.   Next depo due 10/10/2020-10/24/2020.  Reminder card given.

## 2020-08-04 ENCOUNTER — Ambulatory Visit: Payer: Medicaid Other

## 2020-10-10 ENCOUNTER — Ambulatory Visit (INDEPENDENT_AMBULATORY_CARE_PROVIDER_SITE_OTHER): Payer: BLUE CROSS/BLUE SHIELD

## 2020-10-10 ENCOUNTER — Other Ambulatory Visit: Payer: Self-pay

## 2020-10-10 DIAGNOSIS — Z30019 Encounter for initial prescription of contraceptives, unspecified: Secondary | ICD-10-CM

## 2020-10-10 MED ORDER — MEDROXYPROGESTERONE ACETATE 150 MG/ML IM SUSP
150.0000 mg | Freq: Once | INTRAMUSCULAR | Status: AC
  Administered 2020-10-10: 150 mg via INTRAMUSCULAR

## 2020-10-10 NOTE — Progress Notes (Signed)
Patient here today for Depo Provera injection and is within her dates.    Last contraceptive appt was 09/2019  Depo given in [facility] today.  Site unremarkable & patient tolerated injection.    Next injection due 10/11-10/25.  Reminder card given. *Advised patient that she needs to schedule yearly appointment with PCP prior to next depo injection. Patient verbalizes understanding.     Veronda Prude, RN

## 2021-01-01 ENCOUNTER — Encounter: Payer: Self-pay | Admitting: Family Medicine

## 2021-01-01 ENCOUNTER — Ambulatory Visit (INDEPENDENT_AMBULATORY_CARE_PROVIDER_SITE_OTHER): Payer: BLUE CROSS/BLUE SHIELD | Admitting: Family Medicine

## 2021-01-01 ENCOUNTER — Inpatient Hospital Stay (HOSPITAL_COMMUNITY): Admit: 2021-01-01 | Payer: BLUE CROSS/BLUE SHIELD

## 2021-01-01 ENCOUNTER — Other Ambulatory Visit: Payer: Self-pay

## 2021-01-01 VITALS — BP 130/62 | HR 96 | Ht 67.0 in | Wt 184.0 lb

## 2021-01-01 DIAGNOSIS — Z30019 Encounter for initial prescription of contraceptives, unspecified: Secondary | ICD-10-CM | POA: Diagnosis not present

## 2021-01-01 DIAGNOSIS — Z113 Encounter for screening for infections with a predominantly sexual mode of transmission: Secondary | ICD-10-CM

## 2021-01-01 DIAGNOSIS — Z0000 Encounter for general adult medical examination without abnormal findings: Secondary | ICD-10-CM | POA: Diagnosis not present

## 2021-01-01 DIAGNOSIS — N898 Other specified noninflammatory disorders of vagina: Secondary | ICD-10-CM | POA: Diagnosis not present

## 2021-01-01 DIAGNOSIS — G43909 Migraine, unspecified, not intractable, without status migrainosus: Secondary | ICD-10-CM

## 2021-01-01 LAB — POCT WET PREP (WET MOUNT)
Clue Cells Wet Prep Whiff POC: POSITIVE
Trichomonas Wet Prep HPF POC: ABSENT

## 2021-01-01 MED ORDER — MAGNESIUM OXIDE 400 MG PO CAPS: 400.0000 mg | ORAL_CAPSULE | Freq: Every day | ORAL | 0 refills | Status: DC

## 2021-01-01 MED ORDER — MEDROXYPROGESTERONE ACETATE 150 MG/ML IM SUSY
150.0000 mg | PREFILLED_SYRINGE | INTRAMUSCULAR | Status: AC
  Administered 2021-01-01 – 2021-07-16 (×2): 150 mg via INTRAMUSCULAR

## 2021-01-01 NOTE — Patient Instructions (Addendum)
It was great seeing you today!  Today you came in for your physical and pap smear. We also did STI testing. I will call you with any abnormal results and will send a mychart message if normal.  For migraines I recommend Excedrin and I also prescribed Magnesium to use daily until migraines ease up and you can use it as needed.    Schedule to see your PCP if migraines not improved with medication above.  Visit Reminders: - Stop by the pharmacy to pick up your prescriptions  - Continue to work on your healthy eating habits and incorporating exercise into your daily life.   Feel free to call with any questions or concerns at any time, at [phone].   Take care,  Dr. Cora Collum  [facility]   Psychiatry Resource List (Adults and Children) Most of these providers will take Medicaid. please consult your insurance for a complete and updated list of available providers. When calling to make an appointment have your insurance information available to confirm you are covered.   [facility]:Psychiatry and Counseling [address] [place] [place], [place] [address] [phone]  [facility]  [address] [place], [place] [phone] Crisis [phone]   [facility]:   [facility]: [address]     [phone]   [facility]: [address] [place]. [place],        [phone] [place]: [phone],    [phone] [place]: [phone] [address],                   [phone] Children: [facility] [address]         [phone]  [facility] (virtual only) [phone]    [facility]  (Psychiatry only; Adults /children 12 and over, will take Medicaid)  [address] Laurell Josephs [address], [place], [place] [address]       [phone]   [facility] (Psychiatry & counseling ; adults & children ; will take  Medicaid [address]  [place] [address]  Go on-line to complete referral ( [email or web address]://[email or web address] [phone]    (Spanish speaking therapists)  [facility]  Psychiatry & counseling; Adults and children;  Call Registration prior to scheduling an appointment [phone] [address]    [place], [place] [address]    [phone]  [facility] (Psychiatry & counseling; adults & children; Medicare no Medicaid)  [address]   [place], [place]  [address]      [phone]    [facility] (up to age 31)  Psychiatry & counseling ,will take Medicaid, must do counseling to receive psychiatry services  [address] [place] [address]        [phone]  [facility] (Psychiatry & counseling; adults & children; will take Medicaid) Will need a referral from provider [address],  [place], [place]  [phone]   [facility] --- Walk-In Mon-Friday 8am-3pm ( will take Medicaid, Psychiatry, Adults & children,  [address] [place], [place]   [phone]   [facility], Walk-in M-F 8am-12pm and 1pm -3pm   (Counseling, Psychiatry, will take Medicaid, adults & children)  [address] [place], [place]  [phone]
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
## 2021-01-03 ENCOUNTER — Telehealth: Payer: Self-pay | Admitting: Family Medicine

## 2021-01-03 ENCOUNTER — Encounter: Payer: Self-pay | Admitting: Family Medicine

## 2021-01-03 NOTE — Telephone Encounter (Signed)
Called patient to discuss results. Positive for BV but not wanting treatment as it is not bothersome for her. She endorses recurrent BV infections after cervical cyst removal. She denies douching or using any vaginal products. She is also requesting list of therapists which I will send to her mychart

## 2021-01-04 LAB — CYTOLOGY - PAP
Chlamydia: NEGATIVE
Comment: NEGATIVE
Comment: NEGATIVE
Comment: NEGATIVE
Comment: NORMAL
Diagnosis: NEGATIVE
HSV1: NEGATIVE
HSV2: NEGATIVE
Neisseria Gonorrhea: NEGATIVE
Trichomonas: NEGATIVE

## 2021-03-08 ENCOUNTER — Encounter (HOSPITAL_BASED_OUTPATIENT_CLINIC_OR_DEPARTMENT_OTHER): Payer: Self-pay | Admitting: Emergency Medicine

## 2021-03-08 ENCOUNTER — Other Ambulatory Visit: Payer: Self-pay

## 2021-03-08 ENCOUNTER — Emergency Department (HOSPITAL_BASED_OUTPATIENT_CLINIC_OR_DEPARTMENT_OTHER)
Admission: EM | Admit: 2021-03-08 | Discharge: 2021-03-08 | Disposition: A | Discharge status: Home / Self Care | Payer: BLUE CROSS/BLUE SHIELD | Attending: Emergency Medicine | Admitting: Emergency Medicine

## 2021-03-08 DIAGNOSIS — I10 Essential (primary) hypertension: Secondary | ICD-10-CM | POA: Diagnosis not present

## 2021-03-08 DIAGNOSIS — R110 Nausea: Secondary | ICD-10-CM | POA: Insufficient documentation

## 2021-03-08 DIAGNOSIS — R1013 Epigastric pain: Secondary | ICD-10-CM | POA: Insufficient documentation

## 2021-03-08 LAB — PREGNANCY, URINE: Preg Test, Ur: NEGATIVE

## 2021-03-08 MED ORDER — PROMETHAZINE HCL 25 MG PO TABS: 25.0000 mg | ORAL_TABLET | Freq: Three times a day (TID) | ORAL | 0 refills | Status: DC | PRN

## 2021-03-08 MED ORDER — ONDANSETRON 4 MG PO TBDP: 4.0000 mg | ORAL_TABLET | Freq: Three times a day (TID) | ORAL | 0 refills | Status: DC | PRN

## 2021-03-08 MED ORDER — ONDANSETRON 4 MG PO TBDP: 4.0000 mg | ORAL_TABLET | Freq: Once | ORAL | Status: DC

## 2021-03-08 NOTE — ED Triage Notes (Signed)
Pt c/o "heartburn" and nausea x 4 days. Also c/o abd pain.

## 2021-03-08 NOTE — Discharge Instructions (Addendum)
If you develop worsening, continued, or recurrent abdominal pain, uncontrolled vomiting, fever, chest or back pain, or any other new/concerning symptoms then return to the ER for evaluation.  

## 2021-03-08 NOTE — ED Provider Notes (Signed)
[facility] EMERGENCY DEPT Provider Note   CSN: [id number] Arrival date & time: 03/08/21  2409     History Chief Complaint  Patient presents with   Chest Pain    [name] is a 30 y.o. female.  HPI Presents with a chief complaint of nausea.  Starting about 5 days ago she developed chest and epigastric burning that felt like reflux.  This lasted for couple days.  After that she had some bad nausea to the point she thought she might throw up.  That has also improved and now she is still little nauseated but overall better.  No current chest pain.  No recent shortness of breath or cough.  With the persistent nausea she wants to be checked out to make sure she is not pregnant.  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   Low grade squamous intraepithelial lesion (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 09/09/2012    Past Surgical History:  Procedure Laterality Date   LEEP  07/14/14   CIN II with negative margins   LEEP     cervical intraepithelial neoplasm     OB History     Gravida  6   Para  5   Term  5   Preterm  0   AB  0   Living  3      SAB  0   IAB  0   Ectopic  0   Multiple      Live Births  3           Family History  Problem Relation Age of Onset   Cancer Maternal Uncle    Anemia Mother    Migraines Mother    Mitral valve prolapse Sister    Heart murmur Brother    Heart attack Maternal  Grandmother    Mitral valve prolapse Maternal Grandmother    Heart disease Maternal Grandmother    Diabetes Maternal Grandmother    Hyperlipidemia Maternal Grandmother    Hypertension Maternal Grandmother     Social History   Tobacco Use   Smoking status: Never   Smokeless tobacco: Never  Vaping Use   Vaping Use: Never used  Substance Use Topics   Alcohol use: No   Drug use: No    Home Medications Prior to Admission medications   Medication Sig Start Date End Date Taking? Authorizing Provider  ondansetron (ZOFRAN-ODT) 4 MG disintegrating tablet Take 1 tablet (4 mg total) by mouth every 8 (eight) hours as needed for nausea or vomiting. 03/08/21  Yes Pricilla Loveless, MD  promethazine (PHENERGAN) 25 MG tablet Take 1 tablet (25 mg total) by mouth every 8 (eight) hours as needed for nausea or vomiting. 03/08/21  Yes Pricilla Loveless, MD  Magnesium Oxide 400 MG CAPS Take 1 capsule (400 mg total) by mouth daily. 01/01/21   Cora Collum, DO    Allergies  Patient has no known allergies.  Review of Systems   Review of Systems  Constitutional:  Negative for fever.  Respiratory:  Negative for cough and shortness of breath.   Cardiovascular:  Positive for chest pain.  Gastrointestinal:  Positive for abdominal pain and nausea. Negative for vomiting.  All other systems reviewed and are negative.  Physical Exam Updated Vital Signs BP (!) 148/91    Pulse 75    Temp 98.6 F (37 C) (Oral)    Resp (!) 21    Ht 5\' 7"  (1.702 m)    Wt 81.9 kg    SpO2 100%    BMI 28.28 kg/m   Physical Exam Vitals and nursing note reviewed.  Constitutional:      General: She is not in acute distress.    Appearance: She is well-developed. She is not ill-appearing or diaphoretic.  HENT:     Head: Normocephalic and atraumatic.     Right Ear: External ear normal.     Left Ear: External ear normal.     Nose: Nose normal.  Eyes:     General:        Right eye: No discharge.        Left eye: No  discharge.  Cardiovascular:     Rate and Rhythm: Normal rate and regular rhythm.     Heart sounds: Normal heart sounds.  Pulmonary:     Effort: Pulmonary effort is normal.     Breath sounds: Normal breath sounds.  Abdominal:     Palpations: Abdomen is soft.     Tenderness: There is no abdominal tenderness.  Skin:    General: Skin is warm and dry.  Neurological:     Mental Status: She is alert.  Psychiatric:        Mood and Affect: Mood is not anxious.    ED Results / Procedures / Treatments   Labs (all labs ordered are listed, but only abnormal results are displayed) Labs Reviewed  PREGNANCY, URINE    EKG EKG Interpretation  Date/Time:  Thursday March 08 2021 10:23:57 EST Ventricular Rate:  64 PR Interval:  187 QRS Duration: 62 QT Interval:  382 QTC Calculation: 395 R Axis:   51 Text Interpretation: Sinus rhythm Left atrial enlargement RSR' in V1 or V2, probably normal variant no acute ST/T changes Confirmed by 05-23-1999 [phone]) on 03/08/2021 10:25:47 AM  Radiology No results found.  Procedures Procedures   Medications Ordered in ED Medications  ondansetron (ZOFRAN-ODT) disintegrating tablet 4 mg (has no administration in time range)    ED Course  I have reviewed the triage vital signs and the nursing notes.  Pertinent labs & imaging results that were available during my care of the patient were reviewed by me and considered in my medical decision making (see chart for details).    MDM Rules/Calculators/A&P                         Patient reports nausea but no other significant current symptoms.  She declines blood work and imaging which I was recommending due to the epigastric nature of her discomfort and chest pain.  She is mildly hypertensive.  Otherwise, she has a current benign exam.  She is most concerned about whether or not she is pregnant.  This is negative.  I again offered labs and imaging but she declines and would like to go home with  antinausea medicine.  My suspicion is pretty low for ACS  or acute intra-abdominal emergency.  I think she is stable for discharge.    Final Clinical Impression(s) / ED Diagnoses Final diagnoses:  Nausea in adult    Rx / DC Orders ED Discharge Orders          Ordered    ondansetron (ZOFRAN-ODT) 4 MG disintegrating tablet  Every 8 hours PRN        03/08/21 1048    promethazine (PHENERGAN) 25 MG tablet  Every 8 hours PRN        03/08/21 1048             Pricilla Loveless, MD 03/08/21 1050

## 2021-03-08 NOTE — ED Notes (Signed)
Repeat EKG obtained and given to Dr. Goldston.  °

## 2021-04-03 ENCOUNTER — Ambulatory Visit (INDEPENDENT_AMBULATORY_CARE_PROVIDER_SITE_OTHER): Payer: BLUE CROSS/BLUE SHIELD

## 2021-04-03 ENCOUNTER — Other Ambulatory Visit: Payer: Self-pay

## 2021-04-03 DIAGNOSIS — Z30019 Encounter for initial prescription of contraceptives, unspecified: Secondary | ICD-10-CM

## 2021-04-03 LAB — POCT URINE PREGNANCY: Preg Test, Ur: NEGATIVE

## 2021-04-03 MED ORDER — MEDROXYPROGESTERONE ACETATE 150 MG/ML IM SUSP
150.0000 mg | Freq: Once | INTRAMUSCULAR | Status: AC
  Administered 2021-04-03: 150 mg via INTRAMUSCULAR

## 2021-04-03 NOTE — Progress Notes (Signed)
Patient here today for Depo Provera injection and is not within her dates.    Urine pregnancy test obtained and negative.   Last contraceptive appt was 01/01/2021.  Depo given in RUOQ today. Site unremarkable & patient tolerated injection.    Next injection due 06/19/2021-07/03/2021. Reminder card given.

## 2021-05-08 ENCOUNTER — Encounter (HOSPITAL_BASED_OUTPATIENT_CLINIC_OR_DEPARTMENT_OTHER): Payer: Self-pay

## 2021-05-08 ENCOUNTER — Other Ambulatory Visit: Payer: Self-pay

## 2021-05-08 ENCOUNTER — Emergency Department (HOSPITAL_BASED_OUTPATIENT_CLINIC_OR_DEPARTMENT_OTHER)
Admission: EM | Admit: 2021-05-08 | Discharge: 2021-05-08 | Disposition: A | Discharge status: Home / Self Care | Payer: BLUE CROSS/BLUE SHIELD | Attending: Emergency Medicine | Admitting: Emergency Medicine

## 2021-05-08 DIAGNOSIS — K047 Periapical abscess without sinus: Secondary | ICD-10-CM | POA: Diagnosis not present

## 2021-05-08 DIAGNOSIS — I889 Nonspecific lymphadenitis, unspecified: Secondary | ICD-10-CM | POA: Diagnosis not present

## 2021-05-08 DIAGNOSIS — R591 Generalized enlarged lymph nodes: Secondary | ICD-10-CM

## 2021-05-08 DIAGNOSIS — K0889 Other specified disorders of teeth and supporting structures: Secondary | ICD-10-CM | POA: Diagnosis present

## 2021-05-08 MED ORDER — AMOXICILLIN-POT CLAVULANATE 875-125 MG PO TABS: 1.0000 | ORAL_TABLET | Freq: Two times a day (BID) | ORAL | 0 refills | Status: DC

## 2021-05-08 MED ORDER — OXYCODONE-ACETAMINOPHEN 5-325 MG PO TABS: 1.0000 | ORAL_TABLET | Freq: Four times a day (QID) | ORAL | 0 refills | Status: DC | PRN

## 2021-05-08 MED ORDER — LIDOCAINE-EPINEPHRINE (PF) 2 %-1:200000 IJ SOLN
10.0000 mL | Freq: Once | INTRAMUSCULAR | Status: AC
  Administered 2021-05-08: 10 mL
  Filled 2021-05-08: qty 20

## 2021-05-08 MED ORDER — LIDOCAINE VISCOUS HCL 2 % MT SOLN
15.0000 mL | Freq: Once | OROMUCOSAL | Status: AC
  Administered 2021-05-08: 15 mL via OROMUCOSAL
  Filled 2021-05-08: qty 15

## 2021-05-08 NOTE — ED Triage Notes (Signed)
Onset one day swelling at gum left back lower molar

## 2021-05-08 NOTE — Discharge Instructions (Addendum)
You were evaluated in the Emergency Department and after careful evaluation, we did not find any emergent condition requiring admission or further testing in the hospital.  Your exam/testing today was overall reassuring. Please follow-up with a dentist for a repeat assessment in the next few days. Return to the ED if facial swelling worsens despite antibiotic therapy.   Please return to the Emergency Department if you experience any worsening of your condition.  Thank you for allowing [place] to be a part of your care.

## 2021-05-08 NOTE — ED Provider Notes (Signed)
[facility] EMERGENCY DEPT Provider Note   CSN: [id number] Arrival date & time: 05/08/21  3154     History  Chief Complaint  Patient presents with   Dental Pain    [name] is a 31 y.o. female.   Dental Pain  31 year old female presenting to the emergency department with dental pain and swelling.  The patient states that she has had roughly 1 day of swelling at the gum line along the left back lower molar.  She denies any fevers or chills.  She states that she her swelling is increasing.  She endorses sharp pain in the vicinity as well.  Home Medications Prior to Admission medications   Medication Sig Start Date End Date Taking? Authorizing Provider  amoxicillin-clavulanate (AUGMENTIN) 875-125 MG tablet Take 1 tablet by mouth every 12 (twelve) hours. 05/08/21  Yes Ernie Avena, MD  oxyCODONE-acetaminophen (PERCOCET/ROXICET) 5-325 MG tablet Take 1 tablet by mouth every 6 (six) hours as needed for severe pain. 05/08/21  Yes Ernie Avena, MD  Magnesium Oxide 400 MG CAPS Take 1 capsule (400 mg total) by mouth daily. 01/01/21   Cora Collum, DO      Allergies    Patient has no known allergies.    Review of Systems   Review of Systems  HENT:  Positive for dental problem.   All other systems reviewed and are negative.  Physical Exam Updated Vital Signs BP (!) 139/95 (BP Location: Right Arm)    Pulse 85    Temp 98.7 F (37.1 C) (Oral)    Resp 16    Ht 5\' 7"  (1.702 m)    Wt 82.6 kg    SpO2 99%    BMI 28.51 kg/m  Physical Exam Vitals and nursing note reviewed.  Constitutional:      General: She is not in acute distress. HENT:     Head: Normocephalic and atraumatic.     Jaw: Tenderness and swelling present. No trismus.     Comments: Lymphadenopathy noted along the patient's left jaw with some tenderness and swelling, no warmth or erythema noted    Mouth/Throat:     Dentition: Dental abscesses present.      Comments: Tenderness to palpation of the  gum with small fluctuance palpated along the back left lower molar Eyes:     Conjunctiva/sclera: Conjunctivae normal.     Pupils: Pupils are equal, round, and reactive to light.  Cardiovascular:     Rate and Rhythm: Normal rate and regular rhythm.  Pulmonary:     Effort: Pulmonary effort is normal. No respiratory distress.  Abdominal:     General: There is no distension.     Tenderness: There is no guarding.  Musculoskeletal:        General: No deformity or signs of injury.     Cervical back: Neck supple.  Skin:    Findings: No lesion or rash.  Neurological:     General: No focal deficit present.     Mental Status: She is alert. Mental status is at baseline.    ED Results / Procedures / Treatments   Labs (all labs ordered are listed, but only abnormal results are displayed) Labs Reviewed - No data to display  EKG None  Radiology No results found.  Procedures Dental Block  Date/Time: 05/08/2021 9:37 AM Performed by: 05/10/2021, MD Authorized by: Ernie Avena, MD   Consent:    Consent obtained:  Verbal   Consent given by:  Patient   Risks discussed:  Infection, nerve damage and swelling Universal protocol:    Patient identity confirmed:  Verbally with patient Indications:    Indications: dental abscess and dental pain   Location:    Block type:  Inferior alveolar   Laterality:  Left Procedure details:    Syringe type:  Luer lock syringe   Needle gauge: 22G.   Anesthetic injected:  Lidocaine 1% WITH epi   Injection procedure:  Anatomic landmarks identified, introduced needle, incremental injection, negative aspiration for blood and anatomic landmarks palpated Post-procedure details:    Outcome:  Pain improved   Procedure completion:  Tolerated .Marland KitchenIncision and Drainage  Date/Time: 05/08/2021 9:38 AM Performed by: Ernie Avena, MD Authorized by: Ernie Avena, MD   Consent:    Consent obtained:  Verbal   Consent given by:  Patient   Risks discussed:   Bleeding, incomplete drainage, infection and pain Universal protocol:    Patient identity confirmed:  Verbally with patient Location:    Type:  Abscess   Size:  Dental   Location:  Mouth   Mouth location:  Alveolar process Sedation:    Sedation type:  None Anesthesia:    Anesthesia method:  Nerve block   Block location:  See separate note   Block technique:  See separate note   Block outcome:  Anesthesia achieved Procedure type:    Complexity:  Simple Procedure details:    Needle aspiration: yes     Needle size:  22 G   Incision depth:  Dermal   Drainage:  Bloody   Drainage amount:  Scant   Wound treatment:  Wound left open   Packing materials:  None Post-procedure details:    Procedure completion:  Tolerated    Medications Ordered in ED Medications  lidocaine-EPINEPHrine (XYLOCAINE W/EPI) 2 %-1:200000 (PF) injection 10 mL (10 mLs Other Given 05/08/21 0919)  lidocaine (XYLOCAINE) 2 % viscous mouth solution 15 mL (15 mLs Mouth/Throat Given 05/08/21 8295)    ED Course/ Medical Decision Making/ A&P                           Medical Decision Making Risk Prescription drug management.    31 year old female presenting to the emergency department with dental pain and swelling.  The patient states that she has had roughly 1 day of swelling at the gum line along the left back lower molar.  She denies any fevers or chills.  She states that she her swelling is increasing.  She endorses sharp pain in the vicinity as well.  Concern for developing abscess. Will attempt bedside I&D via needle drainage.   Inferior alveolar nerve block was performed with subsequent anesthesia.  Patient has evidence of lymphadenopathy along the left jaw with no clear evidence of abscess on the cheek or face.  Suspect likely reactive lymphadenopathy and lymphadenitis from the patient's dental pain and periapical abscess.  I&D was performed with scant bloody drainage.  Patient was prescribed Augmentin and  Percocet for pain control and advised to follow-up with her dentist for repeat assessment.  Provided return precautions the event of worsening left-sided facial pain and swelling by antibiotic therapy.  Final Clinical Impression(s) / ED Diagnoses Final diagnoses:  Dental abscess  Lymphadenitis  Lymphadenopathy    Rx / DC Orders ED Discharge Orders          Ordered    amoxicillin-clavulanate (AUGMENTIN) 875-125 MG tablet  Every 12 hours        05/08/21 0934  oxyCODONE-acetaminophen (PERCOCET/ROXICET) 5-325 MG tablet  Every 6 hours PRN        05/08/21 0935              Ernie Avena, MD 05/08/21 [phone]

## 2021-05-16 ENCOUNTER — Encounter (HOSPITAL_BASED_OUTPATIENT_CLINIC_OR_DEPARTMENT_OTHER): Payer: Self-pay

## 2021-05-16 ENCOUNTER — Other Ambulatory Visit: Payer: Self-pay

## 2021-05-16 ENCOUNTER — Emergency Department (HOSPITAL_BASED_OUTPATIENT_CLINIC_OR_DEPARTMENT_OTHER)
Admission: EM | Admit: 2021-05-16 | Discharge: 2021-05-16 | Disposition: A | Discharge status: Home / Self Care | Payer: BLUE CROSS/BLUE SHIELD | Attending: Emergency Medicine | Admitting: Emergency Medicine

## 2021-05-16 DIAGNOSIS — M545 Low back pain, unspecified: Secondary | ICD-10-CM | POA: Diagnosis not present

## 2021-05-16 LAB — URINALYSIS, ROUTINE W REFLEX MICROSCOPIC
Bilirubin Urine: NEGATIVE
Glucose, UA: NEGATIVE mg/dL
Hgb urine dipstick: NEGATIVE
Ketones, ur: NEGATIVE mg/dL
Nitrite: NEGATIVE
Protein, ur: 30 mg/dL — AB
Specific Gravity, Urine: 1.033 — ABNORMAL HIGH (ref 1.005–1.030)
pH: 6 (ref 5.0–8.0)

## 2021-05-16 LAB — PREGNANCY, URINE: Preg Test, Ur: NEGATIVE

## 2021-05-16 MED ORDER — CYCLOBENZAPRINE HCL 10 MG PO TABS
10.0000 mg | ORAL_TABLET | Freq: Once | ORAL | Status: AC
  Administered 2021-05-16: 10 mg via ORAL
  Filled 2021-05-16: qty 1

## 2021-05-16 MED ORDER — IBUPROFEN 800 MG PO TABS
800.0000 mg | ORAL_TABLET | Freq: Once | ORAL | Status: AC
  Administered 2021-05-16: 800 mg via ORAL
  Filled 2021-05-16: qty 1

## 2021-05-16 MED ORDER — CYCLOBENZAPRINE HCL 10 MG PO TABS: 10.0000 mg | ORAL_TABLET | Freq: Two times a day (BID) | ORAL | 0 refills | Status: DC | PRN

## 2021-05-16 NOTE — ED Triage Notes (Signed)
Onset yesterday of lower back pain.  Ambulatory to room.  Denies injury ?

## 2021-05-16 NOTE — ED Provider Notes (Signed)
?[facility] EMERGENCY DEPT ?Provider Note ? ? ?CSN: [id number] ?Arrival date & time: 05/16/21  9935 ? ?  ? ?History ? ?Chief Complaint  ?Patient presents with  ? Back Pain  ? ? ?[name] is a 31 y.o. female. ? ?HPI ?31 year old female presents today complaining of back pain.  She states it began yesterday.  It is uncomfortable to twist her torso, flex laterally either way and has some pain with laying on her back.  She has had some similar symptoms with UTIs in the past but is not having any UTI symptoms today.  She also has a history of scoliosis and has had some back pain in the past with this.  She denies any trauma, or other injury.  Patient has had no fevers, weakness, radiation of pain, numbness, tingling, perineal sensation changes, loss of bowel or bladder control. ?  ? ?Home Medications ?Prior to Admission medications   ?Medication Sig Start Date End Date Taking? Authorizing Provider  ?cyclobenzaprine (FLEXERIL) 10 MG tablet Take 1 tablet (10 mg total) by mouth 2 (two) times daily as needed for muscle spasms. 05/16/21  Yes Margarita Grizzle, MD  ?amoxicillin-clavulanate (AUGMENTIN) 875-125 MG tablet Take 1 tablet by mouth every 12 (twelve) hours. 05/08/21   Ernie Avena, MD  ?Magnesium Oxide 400 MG CAPS Take 1 capsule (400 mg total) by mouth daily. 01/01/21   Cora Collum, DO  ?oxyCODONE-acetaminophen (PERCOCET/ROXICET) 5-325 MG tablet Take 1 tablet by mouth every 6 (six) hours as needed for severe pain. 05/08/21   Ernie Avena, MD  ?   ? ?Allergies    ?Patient has no known allergies.   ? ?Review of Systems   ?Review of Systems  ?Constitutional: Negative.   ?HENT: Negative.    ?Musculoskeletal:  Positive for back pain. Negative for gait problem, joint swelling and neck pain.  ?Skin:  Negative for rash and wound.  ? ?Physical Exam ?Updated Vital Signs ?BP (!) 139/95   Pulse 78   Temp 98.2 ?F (36.8 ?C) (Oral)   Resp 15   Ht 1.702 m (5\' 7" )   Wt 83.5 kg   SpO2 100%   BMI 28.82 kg/m?   ?Physical Exam ?Vitals and nursing note reviewed.  ?Constitutional:   ?   General: She is not in acute distress. ?   Appearance: Normal appearance.  ?HENT:  ?   Head: Normocephalic.  ?   Right Ear: External ear normal.  ?   Left Ear: External ear normal.  ?Eyes:  ?   Extraocular Movements: Extraocular movements intact.  ?   Pupils: Pupils are equal, round, and reactive to light.  ?Cardiovascular:  ?   Rate and Rhythm: Normal rate and regular rhythm.  ?   Pulses: Normal pulses.  ?Pulmonary:  ?   Effort: Pulmonary effort is normal.  ?   Breath sounds: Normal breath sounds.  ?Abdominal:  ?   General: Bowel sounds are normal. There is no distension.  ?   Palpations: Abdomen is soft.  ?   Tenderness: There is no abdominal tenderness.  ?Musculoskeletal:     ?   General: Tenderness present. Normal range of motion.  ?   Cervical back: Normal range of motion.  ?   Comments: Patient with some low thoracic to upper lumbar tenderness on right ?Scoliosis noted ?No external signs of trauma, warmth, or crepitus ?  ?Skin: ?   General: Skin is warm and dry.  ?   Capillary Refill: Capillary refill takes less than 2  seconds.  ?   Findings: No rash.  ?Neurological:  ?   General: No focal deficit present.  ?   Mental Status: She is alert.  ?   Sensory: No sensory deficit.  ?   Motor: No weakness.  ?   Coordination: Coordination normal.  ?   Deep Tendon Reflexes: Reflexes normal.  ?Psychiatric:     ?   Mood and Affect: Mood normal.     ?   Behavior: Behavior normal.  ? ? ?ED Results / Procedures / Treatments   ?Labs ?(all labs ordered are listed, but only abnormal results are displayed) ?Labs Reviewed  ?URINALYSIS, ROUTINE W REFLEX MICROSCOPIC - Abnormal; Notable for the following components:  ?    Result Value  ? Specific Gravity, Urine 1.033 (*)   ? Protein, ur 30 (*)   ? Leukocytes,Ua SMALL (*)   ? Bacteria, UA MANY (*)   ? All other components within normal limits  ?PREGNANCY, URINE  ? ? ?EKG ?None ? ?Radiology ?No results  found. ? ?Procedures ?Procedures  ? ? ?Medications Ordered in ED ?Medications  ?ibuprofen (ADVIL) tablet 800 mg (800 mg Oral Given 05/16/21 0823)  ?cyclobenzaprine (FLEXERIL) tablet 10 mg (10 mg Oral Given 05/16/21 0823)  ? ? ?ED Course/ Medical Decision Making/ A&P ?Clinical Course as of 05/17/21 1540  ?Wed May 16, 2021  ?0908 Urinalysis reviewed and appears contaminated with squamous epithelial cells nitrite negative [DR]  ?  ?Clinical Course User Index ?[DR] Margarita Grizzle, MD  ? ?                        ?Medical Decision Making ?Patient with low back pain with differential including musculoskeletal especially given patient's history of scoliosis, UTI with pyelo-, disc etiology, other retroperitoneal, or other low back etiologies considered ?Plan to obtain urinalysis ?Based on exam pain appears mostly musculoskeletal.  We will plan ibuprofen and Flexeril. ?I have discussed return precautions and need for follow-up with patient ? ?Amount and/or Complexity of Data Reviewed ?Labs: ordered. ? ?Risk ?Prescription drug management. ? ? ? ? ? ? ? ? ? ? ?Final Clinical Impression(s) / ED Diagnoses ?Final diagnoses:  ?Acute right-sided low back pain without sciatica  ? ? ?Rx / DC Orders ?ED Discharge Orders   ? ?      Ordered  ?  cyclobenzaprine (FLEXERIL) 10 MG tablet  2 times daily PRN       ? 05/16/21 0910  ? ?  ?  ? ?  ? ? ?  ?Margarita Grizzle, MD ?05/17/21 1540 ? ?

## 2021-05-16 NOTE — Discharge Instructions (Signed)
No definite cause of your pain was found on exam or urinalysis. ?Please use ibuprofen and flexeril as prescribed. ?Return if any new symptoms such as weakness, numbness, or urinary tract infection. ?

## 2021-05-29 ENCOUNTER — Other Ambulatory Visit: Payer: Self-pay | Admitting: Family Medicine

## 2021-06-08 ENCOUNTER — Telehealth: Payer: Self-pay | Admitting: *Deleted

## 2021-06-08 NOTE — Telephone Encounter (Signed)
Patient called stating that she would like to be referred back to general surgery for hernia repair.  She wasn't able to go before and would like to be seen.  I explained to her that we would have to send that referral through [facility] because [facility] no longer accepts medicaid [employer].  Patient voiced understanding and will wait to hear from their office. ? ?Will forward to MD to place referral.  Kaelyn Nauta,CMA ?

## 2021-06-11 ENCOUNTER — Other Ambulatory Visit: Payer: Self-pay | Admitting: Family Medicine

## 2021-06-11 DIAGNOSIS — K439 Ventral hernia without obstruction or gangrene: Secondary | ICD-10-CM

## 2021-06-25 ENCOUNTER — Ambulatory Visit: Payer: BLUE CROSS/BLUE SHIELD

## 2021-07-05 ENCOUNTER — Observation Stay (HOSPITAL_COMMUNITY): Payer: BLUE CROSS/BLUE SHIELD | Admitting: Anesthesiology

## 2021-07-05 ENCOUNTER — Encounter (HOSPITAL_COMMUNITY): Payer: Self-pay | Admitting: Pharmacy Technician

## 2021-07-05 ENCOUNTER — Emergency Department (HOSPITAL_COMMUNITY): Payer: BLUE CROSS/BLUE SHIELD

## 2021-07-05 ENCOUNTER — Other Ambulatory Visit: Payer: Self-pay

## 2021-07-05 ENCOUNTER — Encounter (HOSPITAL_COMMUNITY)
Admission: EM | Disposition: A | Discharge status: Home / Self Care | Payer: Self-pay | Source: Home / Self Care | Attending: Emergency Medicine

## 2021-07-05 ENCOUNTER — Observation Stay (HOSPITAL_COMMUNITY)
Admission: EM | Admit: 2021-07-05 | Discharge: 2021-07-06 | Disposition: A | Discharge status: Home / Self Care | Payer: BLUE CROSS/BLUE SHIELD | Attending: General Surgery | Admitting: General Surgery

## 2021-07-05 DIAGNOSIS — K3580 Unspecified acute appendicitis: Principal | ICD-10-CM | POA: Diagnosis present

## 2021-07-05 DIAGNOSIS — K429 Umbilical hernia without obstruction or gangrene: Secondary | ICD-10-CM | POA: Diagnosis not present

## 2021-07-05 DIAGNOSIS — Z79899 Other long term (current) drug therapy: Secondary | ICD-10-CM | POA: Insufficient documentation

## 2021-07-05 DIAGNOSIS — I10 Essential (primary) hypertension: Secondary | ICD-10-CM | POA: Diagnosis not present

## 2021-07-05 DIAGNOSIS — R1030 Lower abdominal pain, unspecified: Secondary | ICD-10-CM | POA: Diagnosis present

## 2021-07-05 DIAGNOSIS — Z8541 Personal history of malignant neoplasm of cervix uteri: Secondary | ICD-10-CM | POA: Insufficient documentation

## 2021-07-05 HISTORY — PX: LAPAROSCOPIC APPENDECTOMY: SHX408

## 2021-07-05 HISTORY — PX: UMBILICAL HERNIA REPAIR: SHX196

## 2021-07-05 LAB — URINALYSIS, ROUTINE W REFLEX MICROSCOPIC
Bilirubin Urine: NEGATIVE
Glucose, UA: NEGATIVE mg/dL
Hgb urine dipstick: NEGATIVE
Ketones, ur: NEGATIVE mg/dL
Leukocytes,Ua: NEGATIVE
Nitrite: NEGATIVE
Protein, ur: NEGATIVE mg/dL
Specific Gravity, Urine: 1.013 (ref 1.005–1.030)
pH: 7 (ref 5.0–8.0)

## 2021-07-05 LAB — COMPREHENSIVE METABOLIC PANEL
ALT: 20 U/L (ref 0–44)
AST: 20 U/L (ref 15–41)
Albumin: 3.9 g/dL (ref 3.5–5.0)
Alkaline Phosphatase: 61 U/L (ref 38–126)
Anion gap: 8 (ref 5–15)
BUN: 7 mg/dL (ref 6–20)
CO2: 23 mmol/L (ref 22–32)
Calcium: 9 mg/dL (ref 8.9–10.3)
Chloride: 100 mmol/L (ref 98–111)
Creatinine, Ser: 0.83 mg/dL (ref 0.44–1.00)
GFR, Estimated: >60 mL/min (ref >60)
Glucose, Bld: 122 mg/dL — ABNORMAL HIGH (ref 70–99)
Potassium: 3.5 mmol/L (ref 3.5–5.1)
Sodium: 131 mmol/L — ABNORMAL LOW (ref 135–145)
Total Bilirubin: 0.8 mg/dL (ref 0.3–1.2)
Total Protein: 7.8 g/dL (ref 6.5–8.1)

## 2021-07-05 LAB — WET PREP, GENITAL
Trich, Wet Prep: NONE SEEN
WBC, Wet Prep HPF POC: >=10 — AB (ref <10)
Yeast Wet Prep HPF POC: NONE SEEN

## 2021-07-05 LAB — I-STAT BETA HCG BLOOD, ED (MC, WL, AP ONLY): I-stat hCG, quantitative: <5 m[IU]/mL (ref <5)

## 2021-07-05 LAB — CBC
HCT: 37.3 % (ref 36.0–46.0)
Hemoglobin: 12.2 g/dL (ref 12.0–15.0)
MCH: 27.8 pg (ref 26.0–34.0)
MCHC: 32.7 g/dL (ref 30.0–36.0)
MCV: 85 fL (ref 80.0–100.0)
Platelets: 251 10*3/uL (ref 150–400)
RBC: 4.39 MIL/uL (ref 3.87–5.11)
RDW: 13.7 % (ref 11.5–15.5)
WBC: 10.3 10*3/uL (ref 4.0–10.5)
nRBC: 0 % (ref 0.0–0.2)

## 2021-07-05 LAB — LIPASE, BLOOD: Lipase: 26 U/L (ref 11–51)

## 2021-07-05 SURGERY — APPENDECTOMY, LAPAROSCOPIC
Anesthesia: General | Site: Abdomen

## 2021-07-05 MED ORDER — ORAL CARE MOUTH RINSE: 15.0000 mL | Freq: Once | OROMUCOSAL | Status: AC

## 2021-07-05 MED ORDER — ACETAMINOPHEN 500 MG PO TABS: 1000.0000 mg | ORAL_TABLET | Freq: Once | ORAL | Status: AC

## 2021-07-05 MED ORDER — FENTANYL CITRATE (PF) 250 MCG/5ML IJ SOLN
INTRAMUSCULAR | Status: AC
  Filled 2021-07-05: qty 5

## 2021-07-05 MED ORDER — MIDAZOLAM HCL 2 MG/2ML IJ SOLN
INTRAMUSCULAR | Status: DC | PRN
  Administered 2021-07-05: 2 mg via INTRAVENOUS

## 2021-07-05 MED ORDER — MORPHINE SULFATE (PF) 4 MG/ML IV SOLN
4.0000 mg | Freq: Once | INTRAVENOUS | Status: AC
  Administered 2021-07-05: 4 mg via INTRAVENOUS
  Filled 2021-07-05: qty 1

## 2021-07-05 MED ORDER — SCOPOLAMINE 1 MG/3DAYS TD PT72: 1.0000 | MEDICATED_PATCH | TRANSDERMAL | Status: DC

## 2021-07-05 MED ORDER — POLYETHYLENE GLYCOL 3350 17 G PO PACK: 17.0000 g | PACK | Freq: Every day | ORAL | Status: DC | PRN

## 2021-07-05 MED ORDER — IOHEXOL 300 MG/ML  SOLN
100.0000 mL | Freq: Once | INTRAMUSCULAR | Status: AC | PRN
  Administered 2021-07-05: 100 mL via INTRAVENOUS

## 2021-07-05 MED ORDER — ONDANSETRON HCL 4 MG/2ML IJ SOLN
4.0000 mg | Freq: Once | INTRAMUSCULAR | Status: AC
  Administered 2021-07-05: 4 mg via INTRAVENOUS
  Filled 2021-07-05: qty 2

## 2021-07-05 MED ORDER — ACETAMINOPHEN 500 MG PO TABS
1000.0000 mg | ORAL_TABLET | Freq: Four times a day (QID) | ORAL | Status: DC
  Administered 2021-07-05 – 2021-07-06 (×2): 1000 mg via ORAL
  Filled 2021-07-05 (×2): qty 2

## 2021-07-05 MED ORDER — DOCUSATE SODIUM 100 MG PO CAPS
100.0000 mg | ORAL_CAPSULE | Freq: Two times a day (BID) | ORAL | Status: DC
  Administered 2021-07-05: 100 mg via ORAL
  Filled 2021-07-05: qty 1

## 2021-07-05 MED ORDER — AMISULPRIDE (ANTIEMETIC) 5 MG/2ML IV SOLN: 10.0000 mg | Freq: Once | INTRAVENOUS | Status: DC | PRN

## 2021-07-05 MED ORDER — FENTANYL CITRATE (PF) 250 MCG/5ML IJ SOLN
INTRAMUSCULAR | Status: DC | PRN
  Administered 2021-07-05: 100 ug via INTRAVENOUS
  Administered 2021-07-05 (×2): 50 ug via INTRAVENOUS

## 2021-07-05 MED ORDER — SODIUM CHLORIDE 0.9 % IR SOLN
Status: DC | PRN
  Administered 2021-07-05: 1000 mL

## 2021-07-05 MED ORDER — OXYCODONE HCL 5 MG PO TABS: 5.0000 mg | ORAL_TABLET | Freq: Once | ORAL | Status: DC | PRN

## 2021-07-05 MED ORDER — ACETAMINOPHEN 650 MG RE SUPP: 650.0000 mg | Freq: Four times a day (QID) | RECTAL | Status: DC | PRN

## 2021-07-05 MED ORDER — DIPHENHYDRAMINE HCL 50 MG/ML IJ SOLN: 25.0000 mg | Freq: Four times a day (QID) | INTRAMUSCULAR | Status: DC | PRN

## 2021-07-05 MED ORDER — 0.9 % SODIUM CHLORIDE (POUR BTL) OPTIME
TOPICAL | Status: DC | PRN
  Administered 2021-07-05: 1000 mL

## 2021-07-05 MED ORDER — LACTATED RINGERS IV SOLN: INTRAVENOUS | Status: DC

## 2021-07-05 MED ORDER — ROCURONIUM BROMIDE 10 MG/ML (PF) SYRINGE
PREFILLED_SYRINGE | INTRAVENOUS | Status: DC | PRN
  Administered 2021-07-05: 60 mg via INTRAVENOUS

## 2021-07-05 MED ORDER — MORPHINE SULFATE (PF) 2 MG/ML IV SOLN
2.0000 mg | INTRAVENOUS | Status: DC | PRN
  Administered 2021-07-05: 2 mg via INTRAVENOUS
  Filled 2021-07-05: qty 1

## 2021-07-05 MED ORDER — ONDANSETRON HCL 4 MG/2ML IJ SOLN
4.0000 mg | Freq: Four times a day (QID) | INTRAMUSCULAR | Status: DC | PRN
  Administered 2021-07-05: 4 mg via INTRAVENOUS
  Filled 2021-07-05: qty 2

## 2021-07-05 MED ORDER — SODIUM CHLORIDE 0.9 % IV SOLN
2.0000 g | Freq: Once | INTRAVENOUS | Status: AC
  Administered 2021-07-05: 2 g via INTRAVENOUS
  Filled 2021-07-05: qty 20

## 2021-07-05 MED ORDER — PROPOFOL 10 MG/ML IV BOLUS
INTRAVENOUS | Status: DC | PRN
  Administered 2021-07-05: 20 mg via INTRAVENOUS
  Administered 2021-07-05: 150 mg via INTRAVENOUS

## 2021-07-05 MED ORDER — SUGAMMADEX SODIUM 200 MG/2ML IV SOLN
INTRAVENOUS | Status: DC | PRN
  Administered 2021-07-05: 100 mg via INTRAVENOUS
  Administered 2021-07-05: 200 mg via INTRAVENOUS

## 2021-07-05 MED ORDER — METHOCARBAMOL 1000 MG/10ML IJ SOLN
500.0000 mg | Freq: Three times a day (TID) | INTRAVENOUS | Status: DC | PRN
  Filled 2021-07-05: qty 5

## 2021-07-05 MED ORDER — ROCURONIUM BROMIDE 10 MG/ML (PF) SYRINGE
PREFILLED_SYRINGE | INTRAVENOUS | Status: AC
  Filled 2021-07-05: qty 10

## 2021-07-05 MED ORDER — DEXMEDETOMIDINE (PRECEDEX) IN NS 20 MCG/5ML (4 MCG/ML) IV SYRINGE
PREFILLED_SYRINGE | INTRAVENOUS | Status: DC | PRN
  Administered 2021-07-05: 8 ug via INTRAVENOUS

## 2021-07-05 MED ORDER — FENTANYL CITRATE (PF) 100 MCG/2ML IJ SOLN: 25.0000 ug | INTRAMUSCULAR | Status: DC | PRN

## 2021-07-05 MED ORDER — DIPHENHYDRAMINE HCL 25 MG PO CAPS: 25.0000 mg | ORAL_CAPSULE | Freq: Four times a day (QID) | ORAL | Status: DC | PRN

## 2021-07-05 MED ORDER — OXYCODONE HCL 5 MG/5ML PO SOLN: 5.0000 mg | Freq: Once | ORAL | Status: DC | PRN

## 2021-07-05 MED ORDER — PROPOFOL 10 MG/ML IV BOLUS
INTRAVENOUS | Status: AC
  Filled 2021-07-05: qty 20

## 2021-07-05 MED ORDER — CHLORHEXIDINE GLUCONATE 0.12 % MT SOLN: 15.0000 mL | Freq: Once | OROMUCOSAL | Status: AC

## 2021-07-05 MED ORDER — CHLORHEXIDINE GLUCONATE CLOTH 2 % EX PADS: 6.0000 | MEDICATED_PAD | Freq: Once | CUTANEOUS | Status: DC

## 2021-07-05 MED ORDER — BUPIVACAINE-EPINEPHRINE 0.25% -1:200000 IJ SOLN
INTRAMUSCULAR | Status: DC | PRN
Start: 2021-07-05 — End: 2021-07-05
  Administered 2021-07-05: 9 mL

## 2021-07-05 MED ORDER — ENOXAPARIN SODIUM 40 MG/0.4ML IJ SOSY: 40.0000 mg | PREFILLED_SYRINGE | INTRAMUSCULAR | Status: DC

## 2021-07-05 MED ORDER — KETOROLAC TROMETHAMINE 15 MG/ML IJ SOLN
INTRAMUSCULAR | Status: AC
  Filled 2021-07-05: qty 1

## 2021-07-05 MED ORDER — BUPIVACAINE-EPINEPHRINE (PF) 0.25% -1:200000 IJ SOLN
INTRAMUSCULAR | Status: AC
  Filled 2021-07-05: qty 30

## 2021-07-05 MED ORDER — MIDAZOLAM HCL 2 MG/2ML IJ SOLN
INTRAMUSCULAR | Status: AC
  Filled 2021-07-05: qty 2

## 2021-07-05 MED ORDER — SCOPOLAMINE 1 MG/3DAYS TD PT72
MEDICATED_PATCH | TRANSDERMAL | Status: AC
  Administered 2021-07-05: 1.5 mg via TRANSDERMAL
  Filled 2021-07-05: qty 1

## 2021-07-05 MED ORDER — CHLORHEXIDINE GLUCONATE 0.12 % MT SOLN: 15.0000 mL | Freq: Once | OROMUCOSAL | Status: DC

## 2021-07-05 MED ORDER — LIDOCAINE 2% (20 MG/ML) 5 ML SYRINGE
INTRAMUSCULAR | Status: AC
  Filled 2021-07-05: qty 5

## 2021-07-05 MED ORDER — LIDOCAINE 2% (20 MG/ML) 5 ML SYRINGE
INTRAMUSCULAR | Status: DC | PRN
  Administered 2021-07-05: 60 mg via INTRAVENOUS

## 2021-07-05 MED ORDER — ACETAMINOPHEN 325 MG PO TABS: 650.0000 mg | ORAL_TABLET | Freq: Four times a day (QID) | ORAL | Status: DC | PRN

## 2021-07-05 MED ORDER — KETOROLAC TROMETHAMINE 15 MG/ML IJ SOLN
15.0000 mg | Freq: Three times a day (TID) | INTRAMUSCULAR | Status: DC | PRN
  Administered 2021-07-05: 15 mg via INTRAVENOUS

## 2021-07-05 MED ORDER — CHLORHEXIDINE GLUCONATE 0.12 % MT SOLN
OROMUCOSAL | Status: AC
  Administered 2021-07-05: 15 mL via OROMUCOSAL
  Filled 2021-07-05: qty 15

## 2021-07-05 MED ORDER — METOPROLOL TARTRATE 5 MG/5ML IV SOLN: 5.0000 mg | Freq: Four times a day (QID) | INTRAVENOUS | Status: DC | PRN

## 2021-07-05 MED ORDER — SIMETHICONE 80 MG PO CHEW: 40.0000 mg | CHEWABLE_TABLET | Freq: Four times a day (QID) | ORAL | Status: DC | PRN

## 2021-07-05 MED ORDER — ONDANSETRON HCL 4 MG/2ML IJ SOLN
INTRAMUSCULAR | Status: DC | PRN
  Administered 2021-07-05: 4 mg via INTRAVENOUS

## 2021-07-05 MED ORDER — SODIUM CHLORIDE 0.9 % IV SOLN: INTRAVENOUS | Status: DC

## 2021-07-05 MED ORDER — DEXAMETHASONE SODIUM PHOSPHATE 10 MG/ML IJ SOLN
INTRAMUSCULAR | Status: DC | PRN
  Administered 2021-07-05: 10 mg via INTRAVENOUS

## 2021-07-05 MED ORDER — METRONIDAZOLE 500 MG/100ML IV SOLN
500.0000 mg | Freq: Once | INTRAVENOUS | Status: AC
  Administered 2021-07-05: 500 mg via INTRAVENOUS
  Filled 2021-07-05: qty 100

## 2021-07-05 MED ORDER — HYDROMORPHONE HCL 1 MG/ML IJ SOLN: 0.5000 mg | INTRAMUSCULAR | Status: DC | PRN

## 2021-07-05 MED ORDER — ORAL CARE MOUTH RINSE: 15.0000 mL | Freq: Once | OROMUCOSAL | Status: DC

## 2021-07-05 MED ORDER — METHOCARBAMOL 500 MG PO TABS
500.0000 mg | ORAL_TABLET | Freq: Three times a day (TID) | ORAL | Status: DC | PRN
Start: 2021-07-05 — End: 2021-07-06

## 2021-07-05 MED ORDER — MELATONIN 3 MG PO TABS
3.0000 mg | ORAL_TABLET | Freq: Every evening | ORAL | Status: DC | PRN
  Filled 2021-07-05: qty 1

## 2021-07-05 MED ORDER — ONDANSETRON HCL 4 MG/2ML IJ SOLN: 4.0000 mg | Freq: Once | INTRAMUSCULAR | Status: DC | PRN

## 2021-07-05 MED ORDER — ACETAMINOPHEN 500 MG PO TABS
ORAL_TABLET | ORAL | Status: AC
  Administered 2021-07-05: 1000 mg via ORAL
  Filled 2021-07-05: qty 2

## 2021-07-05 SURGICAL SUPPLY — 46 items
APPLIER CLIP 5 13 M/L LIGAMAX5 (MISCELLANEOUS)
BAG COUNTER SPONGE SURGICOUNT (BAG) ×2 IMPLANT
CANISTER SUCT 3000ML PPV (MISCELLANEOUS) ×2 IMPLANT
CHLORAPREP W/TINT 26 (MISCELLANEOUS) ×2 IMPLANT
CLIP APPLIE 5 13 M/L LIGAMAX5 (MISCELLANEOUS) IMPLANT
COVER SURGICAL LIGHT HANDLE (MISCELLANEOUS) ×2 IMPLANT
CUTTER FLEX LINEAR 45M (STAPLE) ×2 IMPLANT
DERMABOND ADVANCED (GAUZE/BANDAGES/DRESSINGS) ×1
DERMABOND ADVANCED .7 DNX12 (GAUZE/BANDAGES/DRESSINGS) ×1 IMPLANT
ELECT REM PT RETURN 9FT ADLT (ELECTROSURGICAL) ×2
ELECTRODE REM PT RTRN 9FT ADLT (ELECTROSURGICAL) ×1 IMPLANT
GLOVE BIO SURGEON STRL SZ7 (GLOVE) ×2 IMPLANT
GLOVE BIOGEL PI IND STRL 7.5 (GLOVE) ×1 IMPLANT
GLOVE BIOGEL PI INDICATOR 7.5 (GLOVE) ×1
GOWN STRL REUS W/ TWL LRG LVL3 (GOWN DISPOSABLE) ×3 IMPLANT
GOWN STRL REUS W/TWL LRG LVL3 (GOWN DISPOSABLE) ×3
GRASPER SUT TROCAR 14GX15 (MISCELLANEOUS) ×2 IMPLANT
KIT BASIN OR (CUSTOM PROCEDURE TRAY) ×2 IMPLANT
KIT TURNOVER KIT B (KITS) ×2 IMPLANT
NS IRRIG 1000ML POUR BTL (IV SOLUTION) ×2 IMPLANT
PAD ARMBOARD 7.5X6 YLW CONV (MISCELLANEOUS) ×4 IMPLANT
POUCH RETRIEVAL ECOSAC 10 (ENDOMECHANICALS) ×1 IMPLANT
POUCH RETRIEVAL ECOSAC 10MM (ENDOMECHANICALS) ×1
RELOAD 45 VASCULAR/THIN (ENDOMECHANICALS) IMPLANT
RELOAD STAPLE 45 2.5 WHT GRN (ENDOMECHANICALS) IMPLANT
RELOAD STAPLE 45 3.5 BLU ETS (ENDOMECHANICALS) IMPLANT
RELOAD STAPLE TA45 3.5 REG BLU (ENDOMECHANICALS) ×2 IMPLANT
SCISSORS LAP 5X35 DISP (ENDOMECHANICALS) IMPLANT
SET IRRIG TUBING LAPAROSCOPIC (IRRIGATION / IRRIGATOR) ×2 IMPLANT
SET TUBE SMOKE EVAC HIGH FLOW (TUBING) ×2 IMPLANT
SHEARS HARMONIC ACE PLUS 36CM (ENDOMECHANICALS) ×2 IMPLANT
SLEEVE ENDOPATH XCEL 5M (ENDOMECHANICALS) ×2 IMPLANT
SPECIMEN JAR SMALL (MISCELLANEOUS) ×2 IMPLANT
STRIP CLOSURE SKIN 1/2X4 (GAUZE/BANDAGES/DRESSINGS) ×2 IMPLANT
SUT MNCRL AB 4-0 PS2 18 (SUTURE) ×2 IMPLANT
SUT NOVA NAB GS-21 0 18 T12 DT (SUTURE) ×1 IMPLANT
SUT VIC AB 3-0 SH 27 (SUTURE) ×1
SUT VIC AB 3-0 SH 27X BRD (SUTURE) IMPLANT
SUT VICRYL 0 UR6 27IN ABS (SUTURE) ×2 IMPLANT
TOWEL GREEN STERILE (TOWEL DISPOSABLE) ×2 IMPLANT
TOWEL GREEN STERILE FF (TOWEL DISPOSABLE) ×2 IMPLANT
TRAY FOLEY MTR SLVR 16FR STAT (SET/KITS/TRAYS/PACK) IMPLANT
TRAY LAPAROSCOPIC MC (CUSTOM PROCEDURE TRAY) ×2 IMPLANT
TROCAR XCEL BLUNT TIP 100MML (ENDOMECHANICALS) ×2 IMPLANT
TROCAR XCEL NON-BLD 5MMX100MML (ENDOMECHANICALS) ×2 IMPLANT
WATER STERILE IRR 1000ML POUR (IV SOLUTION) ×2 IMPLANT

## 2021-07-05 NOTE — Transfer of Care (Signed)
Immediate Anesthesia Transfer of Care Note ? ?Patient: [name] ? ?Procedure(s) Performed: APPENDECTOMY LAPAROSCOPIC (Abdomen) ?HERNIA REPAIR UMBILICAL ADULT (Abdomen) ? ?Patient Location: PACU ? ?Anesthesia Type:General ? ?Level of Consciousness: awake and drowsy ? ?Airway & Oxygen Therapy: Patient Spontanous Breathing ? ?Post-op Assessment: Report given to RN and Post -op Vital signs reviewed and stable ? ?Post vital signs: Reviewed and stable ? ?Last Vitals:  ?Vitals Value Taken Time  ?BP 134/84 07/05/21 1612  ?Temp    ?Pulse 90 07/05/21 1613  ?Resp 14 07/05/21 1613  ?SpO2 93 % 07/05/21 1613  ?Vitals shown include unvalidated device data. ? ?Last Pain:  ?Vitals:  ? 07/05/21 1432  ?TempSrc: Oral  ?PainSc: 6   ?   ? ?Patients Stated Pain Goal: 2 (07/05/21 1432) ? ?Complications: No notable events documented. ?

## 2021-07-05 NOTE — Op Note (Signed)
Preoperative diagnosis: Acute appendicitis ?Postoperative diagnosis: Same as above ?Procedure: Laparoscopic appendectomy, primary umbilical hernia repair ?Surgeon: Dr. Harden Mo ?Anesthesia: General ?Estimated blood loss: Minimal ?Specimens: Appendix to pathology ?Drains: None ?Complications: None ?Sponge needle count was correct completion ?Disposition to recovery in stable condition ? ?Indications: This is a 31 year old female who presented with abdominal pain.  She was found on CT scan to have acute appendicitis. She was tender in rlq.  She was begun on antibiotics. We elected to proceed with a laparoscopic appendectomy today ? ?Procedure: After informed consent was obtained she was taken to the OR. She had already received antibiotics. SCDs were in place.  She was then placed under general anesthesia without complication.  She was prepped and draped in the standard sterile surgical fashion.  A surgical timeout was then performed. ? ?I infiltrated Marcaine below her umbilicus.  II lifted up the umbilicus and entered into the umbilical hernia. She had a significant diastasis as well. I then placed a 0 Vicryl pursestring suture through the fascia.  I then placed a Hassan trocar.  I insufflated the abdomen to 15 mmHg pressure. I then placed two additional 5 mm trocars in the left lower quadrant and the suprapubic region.  I was able to identify the appendix and she had acute nonperforated appendicitis. I was able to identify the terminal ileum and cecum.  I then divided the appendiceal mesentery with the harmonic scalpel.  I then applied the GIA staper to the base and divided the appendix at the cecum. This was placed in a retrieval bag and removed from abdomen. Hemostasis was observed. I removed the hasson trocar and tied the pursestring down.  I closed the hernia with multiple 0 novafil sutures. I then tacked the umbilicus down with 3-0 vicryl suture.   I then desufflated the abdomen and removed the trocars.  I closed the dermis of the umbilical incision with 3-0 vicyrl. Skin was closed with 4-0 monocryl and glue. She was extubated, tolerated this well and transferred to pacu.  ?

## 2021-07-05 NOTE — ED Notes (Addendum)
Called report to Elana RN ?

## 2021-07-05 NOTE — ED Provider Notes (Signed)
?[facility] ?Provider Note ? ? ?CSN: [id number] ?Arrival date & time: 07/05/21  0746 ? ?  ? ?History ? ?Chief Complaint  ?Patient presents with  ? Abdominal Pain  ? ? ?[name] MangoBrittany [name] is a 31 y.o. female. ? ?The history is provided by the patient and medical records. No language interpreter was used.  ?Abdominal Pain ? ?31 year old female significant history of hypertension, anemia, presenting for evaluation of abdominal pain.  Patient reported cute onset of lower abdominal pain that started approximate 10 PM last night.  She described pain as a cramping sensation to her lower abdomen, persistent, worsening with movement.  Pain kept her up at night, she felt nauseous, vomited several times and also noticed little specks of blood in the vomit.  She report on her drive here even applying the seatbelt and hitting any bumps seem to make the pain worse.  She does not endorse any fever chills no runny nose sneezing or coughing no chest pain or shortness of breath no dysuria hematuria no vaginal bleeding or vaginal discharge.  She still has an intact appendix.  Her last menstrual period was approximate 2 weeks ago.  She did try taking some muscle relaxant at home with minimal improvement.  She rates the pain as moderate to severe. ? ?Home Medications ?Prior to Admission medications   ?Medication Sig Start Date End Date Taking? Authorizing Provider  ?amoxicillin-clavulanate (AUGMENTIN) 875-125 MG tablet Take 1 tablet by mouth every 12 (twelve) hours. 05/08/21   Ernie AvenaLawsing, James, MD  ?cyclobenzaprine (FLEXERIL) 10 MG tablet Take 1 tablet (10 mg total) by mouth 2 (two) times daily as needed for muscle spasms. 05/16/21   Margarita Grizzleay, Danielle, MD  ?MAGNESIUM-OXIDE 400 (240 Mg) MG tablet TAKE 1 TABLET BY MOUTH DAILY 05/29/21   Cora CollumPaige, Victoria J, DO  ?oxyCODONE-acetaminophen (PERCOCET/ROXICET) 5-325 MG tablet Take 1 tablet by mouth every 6 (six) hours as needed for severe pain. 05/08/21   Ernie AvenaLawsing, James, MD   ?   ? ?Allergies    ?Patient has no known allergies.   ? ?Review of Systems   ?Review of Systems  ?Gastrointestinal:  Positive for abdominal pain.  ?All other systems reviewed and are negative. ? ?Physical Exam ?Updated Vital Signs ?BP 134/86 (BP Location: Right Arm)   Pulse 83   Temp 98.6 ?F (37 ?C) (Oral)   Resp 16   SpO2 99%  ?Physical Exam ?Vitals and nursing note reviewed.  ?Constitutional:   ?   General: She is not in acute distress. ?   Appearance: She is well-developed. She is obese.  ?HENT:  ?   Head: Atraumatic.  ?Eyes:  ?   Conjunctiva/sclera: Conjunctivae normal.  ?Cardiovascular:  ?   Rate and Rhythm: Normal rate and regular rhythm.  ?Pulmonary:  ?   Effort: Pulmonary effort is normal.  ?Abdominal:  ?   Palpations: Abdomen is soft.  ?   Tenderness: There is abdominal tenderness (Diffuse abdominal tenderness most significant to the suprapubic region without guarding or rebound tenderness.). There is no right CVA tenderness or left CVA tenderness.  ?Musculoskeletal:  ?   Cervical back: Neck supple.  ?Skin: ?   Findings: No rash.  ?Neurological:  ?   Mental Status: She is alert.  ?Psychiatric:     ?   Mood and Affect: Mood normal.  ? ? ?ED Results / Procedures / Treatments   ?Labs ?(all labs ordered are listed, but only abnormal results are displayed) ?Labs Reviewed  ?WET PREP, GENITAL -  Abnormal; Notable for the following components:  ?    Result Value  ? Clue Cells Wet Prep HPF POC PRESENT (*)   ? WBC, Wet Prep HPF POC >=10 (*)   ? All other components within normal limits  ?COMPREHENSIVE METABOLIC PANEL - Abnormal; Notable for the following components:  ? Sodium 131 (*)   ? Glucose, Bld 122 (*)   ? All other components within normal limits  ?URINALYSIS, ROUTINE W REFLEX MICROSCOPIC - Abnormal; Notable for the following components:  ? APPearance HAZY (*)   ? All other components within normal limits  ?RESP PANEL BY RT-PCR (FLU A&B, COVID) ARPGX2  ?LIPASE, BLOOD  ?CBC  ?I-STAT BETA HCG BLOOD, ED ([facility],  [facility], [facility] ONLY)  ?GC/CHLAMYDIA PROBE AMP ([place]) NOT AT [facility]  ? ? ?EKG ?None ? ?Radiology ?CT ABDOMEN PELVIS W CONTRAST ? ?Result Date: 07/05/2021 ?CLINICAL DATA:  Right lower quadrant abdominal pain EXAM: CT ABDOMEN AND PELVIS WITH CONTRAST TECHNIQUE: Multidetector CT imaging of the abdomen and pelvis was performed using the standard protocol following bolus administration of intravenous contrast. RADIATION DOSE REDUCTION: This exam was performed according to the departmental dose-optimization program which includes automated exposure control, adjustment of the mA and/or kV according to patient size and/or use of iterative reconstruction technique. CONTRAST:  OMNIPAQUE IOHEXOL 300 MG/ML  SOLN COMPARISON:  None. FINDINGS: Lower chest: No acute abnormality. Hepatobiliary: No focal liver abnormality is seen. No gallstones, gallbladder wall thickening, or biliary dilatation. Pancreas: Unremarkable. No pancreatic ductal dilatation or surrounding inflammatory changes. Spleen: Normal in size without focal abnormality. Adrenals/Urinary Tract: Adrenal glands are unremarkable. No evidence of hydronephrosis or nephrolithiasis. Linear area of hypoattenuation involving the upper pole the left kidney, likely scarring. Low-attenuation lesion of the lower pole of the right kidney which is too small to accurately characterize, likely a simple cyst, no further follow-up imaging is recommended bladder is unremarkable. Stomach/Bowel: Dilated appendix with mucosal hyperenhancement and surrounding inflammatory change. A small amount of fluid is seen in the right lower quadrant adjacent to the appendix which is likely reactive. No organized fluid collections. Stomach is within normal limits. No evidence of bowel wall thickening, distention, or inflammatory changes. Vascular/Lymphatic: No significant vascular findings are present. 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 No enlarged abdominal or pelvic lymph  nodes. Reproductive: Uterus is unremarkable. Simple appearing cyst of the right adnexa measuring 3.3 cm, likely physiologic with no further follow-up imaging recommended. Other: Rectus diastasis.  No intra-abdominal free air. Musculoskeletal: No acute or significant osseous findings. IMPRESSION: Dilated appendix with mucosal hyperenhancement and surrounding inflammatory change, findings are compatible with acute appendicitis. A small amount of fluid is seen in the right lower quadrant adjacent to the appendix which is likely reactive. No organized fluid collections. Electronically Signed   By: Allegra Lai M.D.   On: 07/05/2021 11:32   ? ?Procedures ?Pelvic exam ? ?Date/Time: 07/05/2021 9:33 AM ?Performed by: Fayrene Helper, PA-C ?Authorized by: Fayrene Helper, PA-C  ?Comments: Chaperone present during exam.  No inguinal lymphadenopathy or inguinal hernia noted.  Normal external genitalia.  No significant discomfort with speculum insertion.  Normal vaginal vault.  Trace mucus noted at closed cervical os.  On bimanual exam no adnexal tenderness no cervical motion tenderness. ? ?  ? ? ?Medications Ordered in ED ?Medications  ?cefTRIAXone (ROCEPHIN) 2 g in sodium chloride 0.9 % 100 mL IVPB (has no administration in time range)  ?  And  ?metroNIDAZOLE (FLAGYL) IVPB 500 mg (has  no administration in time range)  ?morphine (PF) 4 MG/ML injection 4 mg (4 mg Intravenous Given 07/05/21 1016)  ?ondansetron (ZOFRAN) injection 4 mg (4 mg Intravenous Given 07/05/21 1016)  ?iohexol (OMNIPAQUE) 300 MG/ML solution 100 mL (100 mLs Intravenous Contrast Given 07/05/21 1123)  ? ? ?ED Course/ Medical Decision Making/ A&P ?  ?                        ?Medical Decision Making ?Amount and/or Complexity of Data Reviewed ?Labs: ordered. ?Radiology: ordered. ? ?Risk ?Prescription drug management. ? ? ?BP 134/86 (BP Location: Right Arm)   Pulse 83   Temp 98.6 ?F (37 ?C) (Oral)   Resp 16   SpO2 99%  ? ?8:53 AM ?This is a 31 year old female here  with lower abdominal discomfort that started since last night.  She also endorses nausea and vomiting associate with abdominal pain.  She has diffuse abdominal tenderness on exam most significant to her suprapubic re

## 2021-07-05 NOTE — ED Triage Notes (Signed)
Pt here with sudden onset of abdominal cramping and emesis today. Pt reports little specks of blood in her vomit. Denies fevers or diarrhea. Denies sick contacts.  ?

## 2021-07-05 NOTE — Discharge Instructions (Signed)
[facility] ______[facility] LAPAROSCOPIC SURGERY: POST OP INSTRUCTIONS Always review your discharge instruction sheet given to you by the facility where your surgery was performed. IF YOU HAVE DISABILITY OR FAMILY LEAVE FORMS, YOU MUST BRING THEM TO THE OFFICE FOR PROCESSING.   DO NOT GIVE THEM TO YOUR DOCTOR.  A prescription for pain medication may be given to you upon discharge.  Take your pain medication as prescribed, if needed.  If narcotic pain medicine is not needed, then you may take acetaminophen (Tylenol) or ibuprofen (Advil) as needed. Take your usually prescribed medications unless otherwise directed. If you need a refill on your pain medication, please contact your pharmacy.  They will contact our office to request authorization. Prescriptions will not be filled after 5pm or on week-ends. You should follow a light diet the first few days after arrival home, such as soup and crackers, etc.  Be sure to include lots of fluids daily. Most patients will experience some swelling and bruising in the area of the incisions.  Ice packs will help.  Swelling and bruising can take several days to resolve.  It is common to experience some constipation if taking pain medication after surgery.  Increasing fluid intake and taking a stool softener (such as Colace) will usually help or prevent this problem from occurring.  A mild laxative (Milk of Magnesia or Miralax) should be taken according to package instructions if there are no bowel movements after 48 hours. Unless discharge instructions indicate otherwise, you may remove your bandages 24-48 hours after surgery, and you may shower at that time.  You may have steri-strips (small skin tapes) in place directly over the incision.  These strips should be left on the skin for 7-10 days.  If your surgeon used skin glue on the incision, you may shower in 24 hours.  The glue will flake off over the next 2-3 weeks.  Any sutures or staples will be  removed at the office during your follow-up visit. ACTIVITIES:  You may resume regular (light) daily activities beginning the next day--such as daily self-care, walking, climbing stairs--gradually increasing activities as tolerated.  You may have sexual intercourse when it is comfortable.  Refrain from any heavy lifting or straining until approved by your doctor. You may drive when you are no longer taking prescription pain medication, you can comfortably wear a seatbelt, and you can safely maneuver your car and apply brakes. RETURN TO WORK:  __________________________________________________________ You should see your doctor in the office for a follow-up appointment approximately 2-3 weeks after your surgery.  Make sure that you call for this appointment within a day or two after you arrive home to insure a convenient appointment time. OTHER INSTRUCTIONS: __________________________________________________________________________________________________________________________ __________________________________________________________________________________________________________________________ WHEN TO CALL YOUR DOCTOR: Fever over 101.0 Inability to urinate Continued bleeding from incision. Increased pain, redness, or drainage from the incision. Increasing abdominal pain  The clinic staff is available to answer your questions during regular business hours.  Please don't hesitate to call and ask to speak to one of the nurses for clinical concerns.  If you have a medical emergency, go to the nearest emergency room or call 911.  A surgeon from [facility] is always on call at the hospital. [address], [address], [place], [place]  [address] ? [address] [address], [place], [place]   [address] [phone] ? [phone] ? FAX [phone] Web site: [email or web address]  

## 2021-07-05 NOTE — ED Notes (Signed)
Surgery PA at bedside.  
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
## 2021-07-05 NOTE — Anesthesia Procedure Notes (Signed)
Procedure Name: Intubation ?Date/Time: 07/05/2021 3:03 PM ?Performed by: Dorann Lodge, CRNA ?Pre-anesthesia Checklist: Patient identified, Emergency Drugs available, Suction available and Patient being monitored ?Patient Re-evaluated:Patient Re-evaluated prior to induction ?Oxygen Delivery Method: Circle System Utilized ?Preoxygenation: Pre-oxygenation with 100% oxygen ?Induction Type: IV induction ?Ventilation: Mask ventilation without difficulty ?Laryngoscope Size: Mac and 3 ?Grade View: Grade I ?Tube type: Oral ?Tube size: 7.0 mm ?Number of attempts: 1 ?Airway Equipment and Method: Stylet ?Placement Confirmation: ETT inserted through vocal cords under direct vision, positive ETCO2 and breath sounds checked- equal and bilateral ?Secured at: 22 cm ?Tube secured with: Tape ?Dental Injury: Teeth and Oropharynx as per pre-operative assessment  ? ? ? ? ?

## 2021-07-05 NOTE — H&P (Signed)
[facility] ?Admission Note ? ?[name] ?Oct 20, 1990  ?[id number].   ? ?Requesting MD: Noemi Chapel ?Chief Complaint/Reason for Consult: appendicitis  ? ?HPI:  ?[name] is a 31yo female who presented to [facility] earlier this morning complaining of abdominal pain. States that she developed lower abdominal pain around 2200 last night. Pain has gotten worse and is constant. Worse with movement or palpation. Associated with nausea and one episode of vomiting. Denies fever, chills, diarrhea, constipation, dysuria. Last bowel movement was normal today without melena or hematochezia. ?In the ED her BP was found to be mildly elevated, otherwise VSS. WBC 10.3. CT scan shows dilated appendix with mucosal hyperenhancement and surrounding inflammatory change compatible with acute appendicitis; a small amount of fluid is seen in the right lower quadrant adjacent to the appendix which is likely reactive, no organized fluid collections. ?General surgery asked to see. ? ?Last PO intake was some juice around 0600. ? ?Abdominal surgical history: none ?Anticoagulants: none ?Nonsmoker ?Denies alcohol or illicit drug use ? ?Review of Systems  ?Constitutional:  Negative for chills and fever.  ?Respiratory:  Negative for cough and shortness of breath.   ?Cardiovascular:  Negative for chest pain and leg swelling.  ?Gastrointestinal:  Positive for abdominal pain, nausea and vomiting. Negative for constipation and diarrhea.  ?Genitourinary: Negative.   ? ? ?Family History  ?Problem Relation Age of Onset  ? Cancer Maternal Uncle   ? Anemia Mother   ? Migraines Mother   ? Mitral valve prolapse Sister   ? Heart murmur Brother   ? Heart attack Maternal Grandmother   ? Mitral valve prolapse Maternal Grandmother   ? Heart disease Maternal Grandmother   ? Diabetes Maternal Grandmother   ? Hyperlipidemia Maternal Grandmother   ? Hypertension Maternal Grandmother   ? ? ?Past Medical History:  ?Diagnosis Date  ? Anemia   ? CIN II  (cervical intraepithelial neoplasia II)   ? Heart murmur   ? History of gestational hypertension   ? Hypertension   ? Scoliosis   ? Vaginal Pap smear, abnormal   ? ? ?Past Surgical History:  ?Procedure Laterality Date  ? LEEP  07/14/14  ? CIN II with negative margins  ? LEEP    ? cervical intraepithelial neoplasm  ? ? ?Social History:  reports that she has never smoked. She has never used smokeless tobacco. She reports that she does not drink alcohol and does not use drugs. ? ?Allergies: No Known Allergies ? ?(Not in a hospital admission) ? ? ?Prior to Admission medications   ?Medication Sig Start Date End Date Taking? Authorizing Provider  ?cyclobenzaprine (FLEXERIL) 10 MG tablet Take 1 tablet (10 mg total) by mouth 2 (two) times daily as needed for muscle spasms. 05/16/21  Yes Pattricia Boss, MD  ?amoxicillin-clavulanate (AUGMENTIN) 875-125 MG tablet Take 1 tablet by mouth every 12 (twelve) hours. ?Patient not taking: Reported on 07/05/2021 05/08/21   Regan Lemming, MD  ?MAGNESIUM-OXIDE 400 (240 Mg) MG tablet TAKE 1 TABLET BY MOUTH DAILY ?Patient not taking: Reported on 07/05/2021 05/29/21   Shary Key, DO  ?oxyCODONE-acetaminophen (PERCOCET/ROXICET) 5-325 MG tablet Take 1 tablet by mouth every 6 (six) hours as needed for severe pain. ?Patient not taking: Reported on 07/05/2021 05/08/21   Regan Lemming, MD  ? ? ?Blood pressure (!) 132/93, pulse 89, temperature 98.6 ?F (37 ?C), temperature source Oral, resp. rate 16, height 5\' 7"  (1.702 m), weight 87.1 kg, SpO2 100 %. ?Physical Exam: ?General: pleasant, WD/WN female who is  laying in bed in NAD ?HEENT: head is normocephalic, atraumatic.  Sclera are noninjected.  Pupils equal and round.  Ears and nose without any masses or lesions.  Mouth is pink and moist.  ?Heart: regular, rate, and rhythm. Palpable pedal pulses bilaterally  ?Lungs: CTAB, no wheezes, rhonchi, or rales noted.  Respiratory effort nonlabored ?Abd: no prior surgical scars, soft, ND, +BS, no masses,  hernias, or organomegaly. Diffusely TTP but greatest in RLQ without rebound or guarding ?MS: no BUE/BLE edema, calves soft and nontender ?Skin: warm and dry with no masses, lesions, or rashes ?Psych: A&Ox4 with an appropriate affect ?Neuro: cranial nerves grossly intact, equal strength in BUE/BLE bilaterally, normal speech, thought process intact ? ?Results for orders placed or performed during the hospital encounter of 07/05/21 (from the past 48 hour(s))  ?Lipase, blood     Status: None  ? Collection Time: 07/05/21  8:11 AM  ?Result Value Ref Range  ? Lipase 26 11 - 51 U/L  ?  Comment: Performed at [facility], [place] [address] [place], [place] [address]  ?Comprehensive metabolic panel     Status: Abnormal  ? Collection Time: 07/05/21  8:11 AM  ?Result Value Ref Range  ? Sodium 131 (L) 135 - 145 mmol/L  ? Potassium 3.5 3.5 - 5.1 mmol/L  ? Chloride 100 98 - 111 mmol/L  ? CO2 23 22 - 32 mmol/L  ? Glucose, Bld 122 (H) 70 - 99 mg/dL  ?  Comment: Glucose reference range applies only to samples taken after fasting for at least 8 hours.  ? BUN 7 6 - 20 mg/dL  ? Creatinine, Ser 0.83 0.44 - 1.00 mg/dL  ? Calcium 9.0 8.9 - 10.3 mg/dL  ? Total Protein 7.8 6.5 - 8.1 g/dL  ? Albumin 3.9 3.5 - 5.0 g/dL  ? AST 20 15 - 41 U/L  ? ALT 20 0 - 44 U/L  ? Alkaline Phosphatase 61 38 - 126 U/L  ? Total Bilirubin 0.8 0.3 - 1.2 mg/dL  ? GFR, Estimated >60 >60 mL/min  ?  Comment: (NOTE) ?Calculated using the CKD-EPI Creatinine Equation (2021) ?  ? Anion gap 8 5 - 15  ?  Comment: Performed at [facility], [place] [address] [place], [place] [address]  ?CBC     Status: None  ? Collection Time: 07/05/21  8:11 AM  ?Result Value Ref Range  ? WBC 10.3 4.0 - 10.5 K/uL  ? RBC 4.39 3.87 - 5.11 MIL/uL  ? Hemoglobin 12.2 12.0 - 15.0 g/dL  ? HCT 37.3 36.0 - 46.0 %  ? MCV 85.0 80.0 - 100.0 fL  ? MCH 27.8 26.0 - 34.0 pg  ? MCHC 32.7 30.0 - 36.0 g/dL  ? RDW 13.7 11.5 - 15.5 %  ? Platelets 251 150 - 400 K/uL  ? nRBC 0.0 0.0 - 0.2 %  ?  Comment:  Performed at [facility], [place] [address] [place], [place] [address]  ?I-Stat beta hCG blood, ED     Status: None  ? Collection Time: 07/05/21  8:20 AM  ?Result Value Ref Range  ? I-stat hCG, quantitative <5.0 <5 mIU/mL  ? Comment 3          ?  Comment:   GEST. AGE      CONC.  (mIU/mL) ?  <=1 WEEK        5 - 50 ?    2 WEEKS       50 - 500 ?  3 WEEKS       100 - 10,000 ?    4 WEEKS     1,000 - 30,000 ?       ?FEMALE AND NON-PREGNANT FEMALE: ?    LESS THAN 5 mIU/mL ?  ?Urinalysis, Routine w reflex microscopic     Status: Abnormal  ? Collection Time: 07/05/21  8:30 AM  ?Result Value Ref Range  ? Color, Urine YELLOW YELLOW  ? APPearance HAZY (A) CLEAR  ? Specific Gravity, Urine 1.013 1.005 - 1.030  ? pH 7.0 5.0 - 8.0  ? Glucose, UA NEGATIVE NEGATIVE mg/dL  ? Hgb urine dipstick NEGATIVE NEGATIVE  ? Bilirubin Urine NEGATIVE NEGATIVE  ? Ketones, ur NEGATIVE NEGATIVE mg/dL  ? Protein, ur NEGATIVE NEGATIVE mg/dL  ? Nitrite NEGATIVE NEGATIVE  ? Leukocytes,Ua NEGATIVE NEGATIVE  ?  Comment: Performed at [facility], [place] [address], [place], [place] [address]  ?Wet prep, genital     Status: Abnormal  ? Collection Time: 07/05/21  9:32 AM  ? Specimen: PATH Cytology Cervicovaginal Ancillary Only  ?Result Value Ref Range  ? Yeast Wet Prep HPF POC NONE SEEN NONE SEEN  ? Trich, Wet Prep NONE SEEN NONE SEEN  ? Clue Cells Wet Prep HPF POC PRESENT (A) NONE SEEN  ? WBC, Wet Prep HPF POC >=10 (A) <10  ? Sperm PRESENT   ?  Comment: Performed at [facility], [place] [address], [place], [place] [address]  ? ?CT ABDOMEN PELVIS W CONTRAST ? ?Result Date: 07/05/2021 ?CLINICAL DATA:  Right lower quadrant abdominal pain EXAM: CT ABDOMEN AND PELVIS WITH CONTRAST TECHNIQUE: Multidetector CT imaging of the abdomen and pelvis was performed using the standard protocol following bolus administration of intravenous contrast. RADIATION DOSE REDUCTION: This exam was performed according to the departmental dose-optimization program which  includes automated exposure control, adjustment of the mA and/or kV according to patient size and/or use of iterative reconstruction technique. CONTRAST:  195mL OMNIPAQUE IOHEXOL 300 MG/ML  SOLN COMPARISON:  None.

## 2021-07-06 ENCOUNTER — Encounter (HOSPITAL_COMMUNITY): Payer: Self-pay | Admitting: General Surgery

## 2021-07-06 DIAGNOSIS — K3580 Unspecified acute appendicitis: Secondary | ICD-10-CM | POA: Diagnosis not present

## 2021-07-06 LAB — GC/CHLAMYDIA PROBE AMP (~~LOC~~) NOT AT ARMC
Chlamydia: NEGATIVE
Comment: NEGATIVE
Comment: NORMAL
Neisseria Gonorrhea: NEGATIVE

## 2021-07-06 MED ORDER — OXYCODONE HCL 5 MG PO TABS: 5.0000 mg | ORAL_TABLET | Freq: Four times a day (QID) | ORAL | 0 refills | Status: DC | PRN

## 2021-07-06 MED ORDER — METHOCARBAMOL 500 MG PO TABS
500.0000 mg | ORAL_TABLET | Freq: Three times a day (TID) | ORAL | 0 refills | Status: AC | PRN
Start: 2021-07-06 — End: ?

## 2021-07-06 NOTE — Discharge Summary (Signed)
Physician Discharge Summary  ?Patient ID: ?[name] ?MRN: [id number] ?DOB/AGE: 11-22-1990 31 y.o. ? ?Admit date: 07/05/2021 ?Discharge date: 07/06/2021 ? ?Admission Diagnoses: ?Appendicitis ? ?Discharge Diagnoses:  ?Principal Problem: ?  Acute appendicitis ? ? ?Discharged Condition: good ? ?Hospital Course: 69 yof with acute appendicitis. She also has primary umbilical hernia and diastasis recti.  I took to OR for lap appy for acute suppurative appendicitis. I repaired hernia primarily.  We discussed there certainly is risk of recurrence.  She did well and will be discharged voiding and tol diet ? ?Consults: None ? ?Significant Diagnostic Studies: ct with appendicitis ? ?Treatments: surgery: lap appy ? ?Discharge Exam: ?Blood pressure 128/78, pulse 64, temperature 98.1 ?F (36.7 ?C), temperature source Oral, resp. rate 17, height 5\' 7"  (1.702 m), weight 87.1 kg, SpO2 96 %. ?GI: soft approp tender incisions clean ? ?Disposition: Discharge disposition: 01-Home or Self Care ? ? ? ? ? ? ? ?Allergies as of 07/06/2021   ?No Known Allergies ?  ? ?  ?Medication List  ?  ? ?TAKE these medications   ? ?amoxicillin-clavulanate 875-125 MG tablet ?Commonly known as: AUGMENTIN ?Take 1 tablet by mouth every 12 (twelve) hours. ?  ?cyclobenzaprine 10 MG tablet ?Commonly known as: FLEXERIL ?Take 1 tablet (10 mg total) by mouth 2 (two) times daily as needed for muscle spasms. ?  ?MAGnesium-Oxide 400 (240 Mg) MG tablet ?Generic drug: magnesium oxide ?TAKE 1 TABLET BY MOUTH DAILY ?  ?methocarbamol 500 MG tablet ?Commonly known as: ROBAXIN ?Take 1 tablet (500 mg total) by mouth every 8 (eight) hours as needed for muscle spasms. ?  ?oxyCODONE 5 MG immediate release tablet ?Commonly known as: Oxy IR/ROXICODONE ?Take 1 tablet (5 mg total) by mouth every 6 (six) hours as needed for moderate pain, severe pain or breakthrough pain. ?  ?oxyCODONE-acetaminophen 5-325 MG tablet ?Commonly known as: PERCOCET/ROXICET ?Take 1 tablet by mouth every 6  (six) hours as needed for severe pain. ?  ? ?  ? ? ? ?Signed: ?07/08/2021 ?07/06/2021, 7:37 AM ? ? ?

## 2021-07-08 NOTE — Anesthesia Postprocedure Evaluation (Signed)
Anesthesia Post Note ? ?Patient: [name] ? ?Procedure(s) Performed: APPENDECTOMY LAPAROSCOPIC (Abdomen) ?HERNIA REPAIR UMBILICAL ADULT (Abdomen) ? ?  ? ?Patient location during evaluation: PACU ?Anesthesia Type: General ?Level of consciousness: awake and alert ?Pain management: pain level controlled ?Vital Signs Assessment: post-procedure vital signs reviewed and stable ?Respiratory status: spontaneous breathing, nonlabored ventilation, respiratory function stable and patient connected to nasal cannula oxygen ?Cardiovascular status: blood pressure returned to baseline and stable ?Postop Assessment: no apparent nausea or vomiting ?Anesthetic complications: no ? ? ?No notable events documented. ? ?Last Vitals:  ?Vitals:  ? 07/06/21 0430 07/06/21 0730  ?BP: 114/69 128/78  ?Pulse: 85 64  ?Resp: 17 17  ?Temp: 36.8 ?C 36.7 ?C  ?SpO2: 97% 96%  ?  ?Last Pain:  ?Vitals:  ? 07/06/21 0900  ?TempSrc:   ?PainSc: 3   ? ? ?  ?  ?  ?  ?  ?  ? ?[name] ? ? ? ? ?

## 2021-07-09 LAB — SURGICAL PATHOLOGY

## 2021-07-16 ENCOUNTER — Ambulatory Visit (INDEPENDENT_AMBULATORY_CARE_PROVIDER_SITE_OTHER): Payer: BLUE CROSS/BLUE SHIELD

## 2021-07-16 DIAGNOSIS — Z30019 Encounter for initial prescription of contraceptives, unspecified: Secondary | ICD-10-CM | POA: Diagnosis not present

## 2021-07-16 LAB — POCT URINE PREGNANCY: Preg Test, Ur: NEGATIVE

## 2021-07-16 NOTE — Progress Notes (Signed)
Patient here today for Depo Provera injection and is within her dates.   ? ?Last contraceptive appt was 01/01/2021. ? ?Depo given in LUOQ today. Site unremarkable & patient tolerated injection.   ? ?Next injection due 10/01/2021-10/15/2021. Reminder card given.   ? ? ?

## 2021-08-21 ENCOUNTER — Encounter: Payer: Self-pay | Admitting: *Deleted

## 2021-10-01 ENCOUNTER — Ambulatory Visit (INDEPENDENT_AMBULATORY_CARE_PROVIDER_SITE_OTHER): Payer: BLUE CROSS/BLUE SHIELD

## 2021-10-01 DIAGNOSIS — Z30019 Encounter for initial prescription of contraceptives, unspecified: Secondary | ICD-10-CM

## 2021-10-01 MED ORDER — MEDROXYPROGESTERONE ACETATE 150 MG/ML IM SUSP
150.0000 mg | Freq: Once | INTRAMUSCULAR | Status: AC
  Administered 2021-10-01: 150 mg via INTRAMUSCULAR

## 2021-10-01 NOTE — Progress Notes (Signed)
Patient here today for Depo Provera injection and is within her dates.     Last contraceptive appt was 01/01/2021.   Depo given in RUOQ today. Site unremarkable & patient tolerated injection.     Next injection due 12/17/2021-12/31/2021. Reminder card given.    Patient advised to make next injection with PCP to discuss continuing depo provera as her script runs out in October.

## 2021-12-17 ENCOUNTER — Ambulatory Visit (INDEPENDENT_AMBULATORY_CARE_PROVIDER_SITE_OTHER): Payer: Medicaid Other

## 2021-12-17 ENCOUNTER — Ambulatory Visit (HOSPITAL_COMMUNITY)
Admission: EM | Admit: 2021-12-17 | Discharge: 2021-12-17 | Disposition: A | Discharge status: Home / Self Care | Payer: Medicaid Other | Attending: Emergency Medicine | Admitting: Emergency Medicine

## 2021-12-17 ENCOUNTER — Encounter (HOSPITAL_COMMUNITY): Payer: Self-pay | Admitting: *Deleted

## 2021-12-17 ENCOUNTER — Other Ambulatory Visit: Payer: Self-pay

## 2021-12-17 DIAGNOSIS — Z3202 Encounter for pregnancy test, result negative: Secondary | ICD-10-CM

## 2021-12-17 DIAGNOSIS — M546 Pain in thoracic spine: Secondary | ICD-10-CM | POA: Diagnosis not present

## 2021-12-17 DIAGNOSIS — M25512 Pain in left shoulder: Secondary | ICD-10-CM

## 2021-12-17 DIAGNOSIS — M791 Myalgia, unspecified site: Secondary | ICD-10-CM

## 2021-12-17 LAB — POC URINE PREG, ED: Preg Test, Ur: NEGATIVE

## 2021-12-17 MED ORDER — IBUPROFEN 800 MG PO TABS
ORAL_TABLET | ORAL | Status: AC
  Filled 2021-12-17: qty 1

## 2021-12-17 MED ORDER — IBUPROFEN 800 MG PO TABS
800.0000 mg | ORAL_TABLET | Freq: Once | ORAL | Status: AC
  Administered 2021-12-17: 800 mg via ORAL

## 2021-12-17 NOTE — Discharge Instructions (Addendum)
Your xray was negative. Your pain is likely muscular. I recommend using up to 800 mg ibuprofen as needed every 6 hours. You can use muscle relaxer if this helps. Apply hot pad to the area to loosen muscles. You can also try lidocaine patch in the area.  Follow up with orthopedics if symptoms persist despite symptomatic care.

## 2021-12-17 NOTE — ED Triage Notes (Signed)
PT reports lt shoulder pain that started Sunday night . Pt denies any injury.

## 2021-12-17 NOTE — ED Provider Notes (Signed)
[facility]    CSN: [id number] Arrival date & time: 12/17/21  0998      History   Chief Complaint Chief Complaint  Patient presents with   Shoulder Pain    HPI [name] is a 31 y.o. female.  Presents with left scapula pain that began last night. Reports 10/10 pain worse with turning to the left Does not radiate to the shoulder or the arm.  No chest pain or shortness of breath. Denies any injury or trauma, no recent heavy lifting  Tried muscle relaxer last night without relief  Requesting x-ray today  Past Medical History:  Diagnosis Date   Anemia    CIN II (cervical intraepithelial neoplasia II)    Heart murmur    History of gestational hypertension    Hypertension    Scoliosis    Vaginal Pap smear, abnormal     Patient Active Problem List   Diagnosis Date Noted   Acute appendicitis 07/05/2021   Trichomoniasis 12/15/2018   Cyst of left Bartholin's gland 09/05/2017   History of abnormal cervical Pap smear 12/20/2016   Idiopathic scoliosis 12/20/2016   Rash and nonspecific skin eruption 12/20/2016   Fatigue 12/20/2016   Normal postpartum course 03/12/2016   Moderate dysplasia of cervix (CIN II) 06/09/2014   Low grade squamous intraepithelial lesion (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 09/09/2012    Past Surgical History:  Procedure Laterality Date   LAPAROSCOPIC APPENDECTOMY N/A 07/05/2021   Procedure: APPENDECTOMY LAPAROSCOPIC;  Surgeon: Emelia Loron, MD;  Location: [facility] OR;  Service: General;  Laterality: N/A;   LEEP  07/14/14   CIN II with negative margins   LEEP     cervical intraepithelial neoplasm   UMBILICAL HERNIA REPAIR N/A 07/05/2021   Procedure: HERNIA REPAIR UMBILICAL ADULT;  Surgeon: Emelia Loron, MD;  Location: [facility] OR;  Service: General;  Laterality: N/A;    OB History     Gravida  6   Para  5   Term  5   Preterm  0   AB  0   Living  3      SAB  0   IAB  0   Ectopic  0   Multiple      Live Births  3             Home Medications    Prior to Admission medications   Medication Sig Start Date End Date Taking? Authorizing Provider  cyclobenzaprine (FLEXERIL) 10 MG tablet Take 1 tablet (10 mg total) by mouth 2 (two) times daily as needed for muscle spasms. 05/16/21   Margarita Grizzle, MD  methocarbamol (ROBAXIN) 500 MG tablet Take 1 tablet (500 mg total) by mouth every 8 (eight) hours as needed for muscle spasms. 07/06/21   Emelia Loron, MD    Family History Family History  Problem Relation Age of Onset   Cancer Maternal Uncle    Anemia Mother    Migraines Mother    Mitral valve prolapse Sister    Heart murmur Brother    Heart attack Maternal Grandmother    Mitral valve prolapse Maternal Grandmother    Heart disease Maternal Grandmother    Diabetes Maternal Grandmother    Hyperlipidemia Maternal Grandmother    Hypertension Maternal Grandmother     Social History Social History   Tobacco Use   Smoking status: Never   Smokeless tobacco: Never  Vaping Use   Vaping Use: Never used  Substance Use Topics   Alcohol use: No  Drug use: No     Allergies   Patient has no known allergies.   Review of Systems Review of Systems  Per HPI  Physical Exam Triage Vital Signs ED Triage Vitals  Enc Vitals Group     BP 12/17/21 0935 134/86     Pulse Rate 12/17/21 0935 65     Resp 12/17/21 0935 18     Temp 12/17/21 0935 98 F (36.7 C)     Temp src --      SpO2 12/17/21 0935 98 %     Weight --      Height --      Head Circumference --      Peak Flow --      Pain Score 12/17/21 0933 10     Pain Loc --      Pain Edu? --      Excl. in [place]? --    No data found.  Updated Vital Signs BP 134/86   Pulse 65   Temp 98 F (36.7 C)   Resp 18   SpO2 98%    Physical Exam Vitals and nursing note reviewed.  Constitutional:      General: She is not in acute distress.    Appearance: Normal appearance.  Cardiovascular:     Rate and Rhythm: Normal rate and regular rhythm.      Pulses: Normal pulses.     Heart sounds: Normal heart sounds.  Pulmonary:     Effort: Pulmonary effort is normal.     Breath sounds: Normal breath sounds.  Musculoskeletal:        General: Normal range of motion.       Back:     Comments: Isolated area of tenderness left thoracic paraspinals. Full ROM of left shoulder. No spinal tenderness. 5/5 strength upper extremities. Sensation intact.  Skin:    Capillary Refill: Capillary refill takes less than 2 seconds.  Neurological:     Mental Status: She is alert and oriented to person, place, and time.     UC Treatments / Results  Labs (all labs ordered are listed, but only abnormal results are displayed) Labs Reviewed  POC URINE PREG, ED    EKG   Radiology DG Scapula Left  Result Date: 12/17/2021 CLINICAL DATA:  Left shoulder pain that started Saturday night. No history of injury. EXAM: LEFT SCAPULA - 2+ VIEWS COMPARISON:  None Available. FINDINGS: There is no evidence of fracture or other focal bone lesions. No arthropathy. Soft tissues are unremarkable. IMPRESSION: Negative. Electronically Signed   By: Ileana Roup M.D.   On: 12/17/2021 10:44    Procedures Procedures (including critical care time)  Medications Ordered in UC Medications  ibuprofen (ADVIL) tablet 800 mg (800 mg Oral Given 12/17/21 1040)    Initial Impression / Assessment and Plan / UC Course  I have reviewed the triage vital signs and the nursing notes.  Pertinent labs & imaging results that were available during my care of the patient were reviewed by me and considered in my medical decision making (see chart for details).  Likely muscular Patient requesting x-ray today, urine pregnancy negative, negative x-ray. Ibuprofen dose given.  Discussed using ibuprofen, muscle relaxer, hot pad, lidocaine patch, other symptomatic care  Patient requesting a "brace" for the shoulder.  Discussed this likely won't help muscular pain, however patient states she would  like to try it. Provided sling. Discussed continue to use the arm, only use sling temporarily for next day or so. Reiterated sling  won't do much for muscular pain and puts at risk for worsening symptoms. Patient verbalized understanding.  Work note provided. Return precautions discussed. Patient agrees to plan  Final Clinical Impressions(s) / UC Diagnoses   Final diagnoses:  Muscular pain  Acute left-sided thoracic back pain     Discharge Instructions      Your xray was negative. Your pain is likely muscular. I recommend using up to 800 mg ibuprofen as needed every 6 hours. You can use muscle relaxer if this helps. Apply hot pad to the area to loosen muscles. You can also try lidocaine patch in the area.  Follow up with orthopedics if symptoms persist despite symptomatic care.    ED Prescriptions   None    PDMP not reviewed this encounter.   Kaylany Tesoriero, Lurena Joiner, PA-C 12/17/21 1059

## 2022-01-02 ENCOUNTER — Ambulatory Visit (INDEPENDENT_AMBULATORY_CARE_PROVIDER_SITE_OTHER): Payer: Commercial Managed Care - HMO | Admitting: Family Medicine

## 2022-01-02 ENCOUNTER — Encounter: Payer: Self-pay | Admitting: Family Medicine

## 2022-01-02 ENCOUNTER — Other Ambulatory Visit: Payer: Self-pay

## 2022-01-02 VITALS — BP 147/91 | HR 80 | Ht 67.0 in | Wt 193.8 lb

## 2022-01-02 DIAGNOSIS — Z0000 Encounter for general adult medical examination without abnormal findings: Secondary | ICD-10-CM | POA: Diagnosis not present

## 2022-01-02 DIAGNOSIS — R030 Elevated blood-pressure reading, without diagnosis of hypertension: Secondary | ICD-10-CM

## 2022-01-02 MED ORDER — NORGESTIMATE-ETH ESTRADIOL 0.25-35 MG-MCG PO TABS: 1.0000 | ORAL_TABLET | Freq: Every day | ORAL | 11 refills | Status: DC

## 2022-01-02 NOTE — Patient Instructions (Signed)
It was great seeing you today!  You came in for your physical, and I am glad to see you are doing well!  Your blood pressure was elevated today so like we discussed it you can take a couple measurements in the morning and at night for about 3 days, and make sure you are sitting in a chair feet flat on the ground and back against a chair.  Please send me these numbers on MyChart.  We will likely need to start you on a blood pressure medication.  WE recommend 150 minutes of moderate intensity exercise a week. Weight loss will also be help with your blood pressure.   For your pH balance we discussed healthy eating, yogurts, boric acid over-the-counter and hygiene techniques.    Visit Reminders: - Stop by the pharmacy to pick up your prescriptions  - Continue to work on your healthy eating habits and incorporating exercise into your daily life.    Feel free to call with any questions or concerns at any time, at [phone].   Take care,  Dr. Shary Key [facility]

## 2022-01-02 NOTE — Progress Notes (Signed)
    SUBJECTIVE:   Chief compliant/HPI: annual examination  Samreen Seltzer is a 31 y.o. who presents today for an annual exam.   Wants to know how to help vaginal pH balance   No tobacco use, alcohol use, recreational drug use.  Sexually active, no new parter. Feels safe in relat[place]hop  Was on depo but would like to stop this. Had intermittent spotting   Exercise by walking 30 min a week  Feels safe in relationship   OBJECTIVE:   BP (!) 147/91   Pulse 80   Wt 193 lb 12.8 oz (87.9 kg)   SpO2 98%   BMI 30.35 kg/m    Physical exam General: well appearing, NAD Cardiovascular: RRR, no murmurs Lungs: CTAB. Normal WOB Abdomen: soft, non-distended, non-tender Skin: warm, dry. No edema  ASSESSMENT/PLAN:   No problem-specific Assessment & Plan notes found for this encounter.    Annual Examination  See AVS for age appropriate recommendations  PHQ score 2, reviewed and discussed.  Blood pressure reviewed 147/91 and even after repeat was not at goal.  Patient has a blood pressure cuff at home, recommended taking a couple measurements in the morning and at night and send me those numbers via MyChart.  Discussed that we will likely need to start medication which she was agreeable to.  Once we do start we will have her come back to the clinic for follow-up and labs.  Feels safe and is in a monogamous relationship   Discussed healthy eating and increasing physical activity and incorporating resistance training.   Considered the following items based upon USPSTF recommendations: HIV testing: declined Syphilis if at high risk: {declined GC/CTdeclined Lipid panel (nonfasting or fasting) discussed based upon AHA recommendations and will order at follow up appointment when we get labs after starting blood pressure medication.  Reviewed risk factors for latent tuberculosis and not indicated    Shary Key, [place]

## 2022-01-08 ENCOUNTER — Ambulatory Visit (INDEPENDENT_AMBULATORY_CARE_PROVIDER_SITE_OTHER): Payer: Commercial Managed Care - HMO

## 2022-01-08 DIAGNOSIS — Z30019 Encounter for initial prescription of contraceptives, unspecified: Secondary | ICD-10-CM

## 2022-01-08 LAB — POCT URINE PREGNANCY: Preg Test, Ur: NEGATIVE

## 2022-01-08 MED ORDER — MEDROXYPROGESTERONE ACETATE 150 MG/ML IM SUSP
150.0000 mg | Freq: Once | INTRAMUSCULAR | Status: AC
  Administered 2022-06-17: 150 mg via INTRAMUSCULAR

## 2022-01-08 NOTE — Progress Notes (Signed)
Patient here today for Depo Provera injection and is not within her dates. Pregnancy test obtained and negative.    Last contraceptive appt was 01/02/2022.   Depo given in [place] today. Site unremarkable & patient tolerated injection.     Next injection due 03/26/2022-04/09/2022. Reminder card given.

## 2022-03-29 ENCOUNTER — Ambulatory Visit (INDEPENDENT_AMBULATORY_CARE_PROVIDER_SITE_OTHER): Payer: Commercial Managed Care - HMO

## 2022-03-29 DIAGNOSIS — Z30019 Encounter for initial prescription of contraceptives, unspecified: Secondary | ICD-10-CM

## 2022-03-29 MED ORDER — MEDROXYPROGESTERONE ACETATE 150 MG/ML IM SUSP
150.0000 mg | Freq: Once | INTRAMUSCULAR | Status: AC
  Administered 2022-03-29: 150 mg via INTRAMUSCULAR

## 2022-03-29 NOTE — Progress Notes (Addendum)
Patient here today for Depo Provera injection and is within her dates.    Last contraceptive appt was 01/02/2022  Depo given in [place] today.  Site unremarkable & patient tolerated injection.    Next injection due 06/15/22-06/29/22.  Reminder card given.    Talbot Grumbling, RN

## 2022-05-27 ENCOUNTER — Telehealth: Payer: Self-pay | Admitting: Family Medicine

## 2022-05-27 ENCOUNTER — Ambulatory Visit (INDEPENDENT_AMBULATORY_CARE_PROVIDER_SITE_OTHER): Payer: Medicaid Other | Admitting: Family Medicine

## 2022-05-27 ENCOUNTER — Encounter: Payer: Self-pay | Admitting: Family Medicine

## 2022-05-27 VITALS — BP 124/82 | HR 70 | Ht 67.0 in | Wt 199.0 lb

## 2022-05-27 DIAGNOSIS — N926 Irregular menstruation, unspecified: Secondary | ICD-10-CM | POA: Diagnosis not present

## 2022-05-27 DIAGNOSIS — R102 Pelvic and perineal pain: Secondary | ICD-10-CM | POA: Diagnosis not present

## 2022-05-27 DIAGNOSIS — A599 Trichomoniasis, unspecified: Secondary | ICD-10-CM

## 2022-05-27 LAB — POCT WET PREP (WET MOUNT): Clue Cells Wet Prep Whiff POC: NEGATIVE

## 2022-05-27 LAB — POCT GLYCOSYLATED HEMOGLOBIN (HGB A1C): Hemoglobin A1C: 5.7 % — AB (ref 4.0–5.6)

## 2022-05-27 LAB — POCT URINE PREGNANCY: Preg Test, Ur: NEGATIVE

## 2022-05-27 MED ORDER — NORETHINDRONE 0.35 MG PO TABS: 1.0000 | ORAL_TABLET | Freq: Every day | ORAL | 11 refills | Status: DC

## 2022-05-27 MED ORDER — NORGESTIM-ETH ESTRAD TRIPHASIC 0.18/0.215/0.25 MG-25 MCG PO TABS: 1.0000 | ORAL_TABLET | Freq: Every day | ORAL | 11 refills | Status: DC

## 2022-05-27 MED ORDER — METRONIDAZOLE 500 MG PO TABS: 500.0000 mg | ORAL_TABLET | Freq: Two times a day (BID) | ORAL | 0 refills | Status: DC

## 2022-05-27 NOTE — Assessment & Plan Note (Signed)
Patient's pelvic pain most likely due to PCOS or endometriosis. CT on 07/05/2021 did not show any fibroids, did show one small adnexal cyst seeming physiologic. She has had irregular periods; however, has also been on depo shot for past 6 years. No history of menorrhagia or pelvic pain in the past and has had successful pregnancies. She has had normal pap smear in 2022.  - Pelvic ultrasound  - TSH, A1c to risk stratify for PCOS  - Wet prep  - f/u in 4 months

## 2022-05-27 NOTE — Assessment & Plan Note (Addendum)
Irregular bleeding most likely due to PCOS or endometriosis, though patient does not have known previous history. Has been on depo shot for past 6 years. No recent unintentional weight loss or gain. Has not had sexual intercourse in the last month and not due for next depo shot until end of this month or beginning of April - unlikely SAB.  With other problem of pelvic pain, endometriosis could be more likely. Normal pap smear, given premenopausal low likelihood of endometrial malignancy. Patient would like additional hormonal control to assist with bleeding. She does have history of migraines with aura and hypertension.  - Urine Preg  - Advised to discontinue OCPs, and ordered progestin only pills -  CBC, ferritin   - A1c, TSH - pelvic ultrasound

## 2022-05-27 NOTE — Patient Instructions (Signed)
It was wonderful to see you today.  Please bring ALL of your medications with you to every visit.   Today we talked about:  Vaginal bleeding and pelvic pain - You most likely have either PCOS or endometriosis which can cause pelvic pain and irregular bleeding. To help with the bleeding you can take the birth control pill I prescribed. For pain you can take 600-800 mg ibuprofen three times a day. For nausea you can drink ginger tea.   You will get a call with scheduling for the ultrasound.   I will follow up with you about your labs   Please follow up in 4 months   Thank you for choosing [facility].   Please call [phone] with any questions about today's appointment.  Please be sure to schedule follow up at the front desk before you leave today.   Lowry Ram, MD  Family Medicine

## 2022-05-27 NOTE — Progress Notes (Signed)
    SUBJECTIVE:   CHIEF COMPLAINT / HPI:   Patient reports that she started having pelvic pain and vaginal bleeding starting a week and half ago. She reports that she does not usually have heavy bleeding with her periods. She reports tablespoon sized clots and pelvic pain with pressure on her bladder. Also endorses nausea without vomiting. Patient also says that she feels lightheaded with chills. Denies dysuria, abnormal vaginal discharge, irritation, or smell,   Denies any history of PCOS or endometriosis that she knows of. She is mostly amenorrheic as she has been using depo shot consistently for the past 6 years.  Jan 12 got her depo shot, due for next for one March 30th - April 13th  She has had an abnormal pap in the past but had a normal pap in 2022.    She has had normal pregnancies in the past.   Denies excess hair growth. Denies previous pelvic pain in between periods.  Does have history of cervical polyps.   PERTINENT  PMH / PSH: On depo, h/o appendectomy, G4P4  OBJECTIVE:   BP 124/82   Pulse 70   Ht 5\' 7"  (1.702 m)   Wt 199 lb (90.3 kg)   LMP 05/17/2022 (Approximate)   BMI 31.17 kg/m   General: well appearing, in no acute distress CV: RRR, radial pulses equal and palpable Resp: Normal work of breathing on room air Abd: Soft, non tender, non distended  Neuro: Alert & Oriented x 4  GU:(Chaperoned by Jazmin CMA) scant bleeding in vaginal vault,  cervix midline, no cervical polyps visualized, no abnormal masses palpated via bimanual exam, some left sided pelvic tenderness with bimanual    ASSESSMENT/PLAN:   Pelvic pain Patient's pelvic pain most likely due to PCOS or endometriosis. CT on 07/05/2021 did not show any fibroids, did show one small adnexal cyst seeming physiologic. She has had irregular periods; however, has also been on depo shot for past 6 years. No history of menorrhagia or pelvic pain in the past and has had successful pregnancies. She has had normal pap  smear in 2022.  - Pelvic ultrasound  - TSH, A1c to risk stratify for PCOS  - Wet prep  - f/u in 4 months   Irregular menstrual bleeding Irregular bleeding most likely due to PCOS or endometriosis, though patient does not have known previous history. Has been on depo shot for past 6 years. No recent unintentional weight loss or gain. Has not had sexual intercourse in the last month and not due for next depo shot until end of this month or beginning of April - unlikely SAB.  With other problem of pelvic pain, endometriosis could be more likely. Normal pap smear, given premenopausal low likelihood of endometrial malignancy. Patient would like additional hormonal control to assist with bleeding. She does have history of migraines with aura and hypertension.  - Urine Preg  - Advised to discontinue OCPs, and ordered progestin only pills -  CBC, ferritin   - A1c, TSH - pelvic ultrasound      Lowry Ram, MD [employer]

## 2022-05-27 NOTE — Telephone Encounter (Signed)
Phone call to patient to inform of positive trichamonas. Patient has history of trichomoniasis. Educated patient regarding risk of passing back and forth with partner if one is not treated. Will send antibiotic treatment.   Also informed of very slightly elevated A1c at 5.7 and recommended lifestyle interventions.

## 2022-05-28 LAB — CBC WITH DIFFERENTIAL/PLATELET
Basophils Absolute: 0 10*3/uL (ref 0.0–0.2)
Basos: 0 %
EOS (ABSOLUTE): 0.1 10*3/uL (ref 0.0–0.4)
Eos: 2 %
Hematocrit: 25.7 % — ABNORMAL LOW (ref 34.0–46.6)
Hemoglobin: 8.4 g/dL — ABNORMAL LOW (ref 11.1–15.9)
Immature Grans (Abs): 0.1 10*3/uL (ref 0.0–0.1)
Immature Granulocytes: 1 %
Lymphocytes Absolute: 2.1 10*3/uL (ref 0.7–3.1)
Lymphs: 28 %
MCH: 26.9 pg (ref 26.6–33.0)
MCHC: 32.7 g/dL (ref 31.5–35.7)
MCV: 82 fL (ref 79–97)
Monocytes Absolute: 0.4 10*3/uL (ref 0.1–0.9)
Monocytes: 6 %
Neutrophils Absolute: 4.6 10*3/uL (ref 1.4–7.0)
Neutrophils: 63 %
Platelets: 241 10*3/uL (ref 150–450)
RBC: 3.12 x10E6/uL — ABNORMAL LOW (ref 3.77–5.28)
RDW: 13.1 % (ref 11.7–15.4)
WBC: 7.3 10*3/uL (ref 3.4–10.8)

## 2022-05-28 LAB — TSH RFX ON ABNORMAL TO FREE T4: TSH: 1.5 u[IU]/mL (ref 0.450–4.500)

## 2022-05-28 LAB — FERRITIN: Ferritin: 85 ng/mL (ref 15–150)

## 2022-05-29 ENCOUNTER — Telehealth: Payer: Self-pay | Admitting: Family Medicine

## 2022-05-29 ENCOUNTER — Other Ambulatory Visit: Payer: Self-pay | Admitting: Family Medicine

## 2022-05-29 DIAGNOSIS — D649 Anemia, unspecified: Secondary | ICD-10-CM

## 2022-05-29 MED ORDER — IRON 325 (65 FE) MG PO TABS: 1.0000 | ORAL_TABLET | ORAL | 0 refills | Status: DC

## 2022-05-29 NOTE — Telephone Encounter (Signed)
Called patient to discuss hgb. Prescribed po feso 325 mg every other day and made appointment for in one month to check CBC.

## 2022-05-30 ENCOUNTER — Ambulatory Visit (HOSPITAL_COMMUNITY)
Admission: RE | Admit: 2022-05-30 | Discharge: 2022-05-30 | Disposition: A | Discharge status: Home / Self Care | Payer: Medicaid Other | Source: Ambulatory Visit | Attending: Family Medicine | Admitting: Family Medicine

## 2022-05-30 DIAGNOSIS — N926 Irregular menstruation, unspecified: Secondary | ICD-10-CM | POA: Insufficient documentation

## 2022-05-30 DIAGNOSIS — R102 Pelvic and perineal pain: Secondary | ICD-10-CM | POA: Insufficient documentation

## 2022-06-07 ENCOUNTER — Ambulatory Visit (INDEPENDENT_AMBULATORY_CARE_PROVIDER_SITE_OTHER): Payer: Medicaid Other | Admitting: Family Medicine

## 2022-06-07 ENCOUNTER — Other Ambulatory Visit (HOSPITAL_COMMUNITY)
Admission: RE | Admit: 2022-06-07 | Discharge: 2022-06-07 | Disposition: A | Discharge status: Home / Self Care | Payer: Medicaid Other | Source: Ambulatory Visit | Attending: Family Medicine | Admitting: Family Medicine

## 2022-06-07 VITALS — BP 122/85 | HR 86 | Ht 67.0 in | Wt 198.4 lb

## 2022-06-07 DIAGNOSIS — N926 Irregular menstruation, unspecified: Secondary | ICD-10-CM | POA: Insufficient documentation

## 2022-06-07 NOTE — Patient Instructions (Addendum)
It was great seeing you today!  Today you got the Depo shot, and we checked your urine for infection.  I will call you if anything is abnormal, or we will send a MyChart message if normal.  We are referring you to gyn for further evaluation of your abnormal bleeding.  They will call you in the next couple weeks to schedule that appointment.  Please check-out at the front desk before leaving the clinic. I'd like to see you back in 3 months for retesting of STIs, but if you need to be seen earlier than that for any new issues we're happy to fit you in, just give [place] a call!   Feel free to call with any questions or concerns at any time, at [phone].   Take care,  Dr. Shary Key [facility]

## 2022-06-07 NOTE — Progress Notes (Unsigned)
    SUBJECTIVE:   CHIEF COMPLAINT / HPI:   Patient presents to discuss recent pelvic ultrasound. Was seen in clinic on 3/11 for irregular bleeding and pelvic pain. Pelvic US ordered which showed Endometrial canal distended by complex heterogeneous material measuring 3.3 x 1.9 x 3.6 cm and a small amount of associated fluid, question clot though a hypovascular mass is not excluded. Consider short-term follow-up ultrasound in 6-12 weeks to ensure resolution.  Patient states she would like to keep the birth control she is on to help with the bleeding. Open to gyn referral but states she is not wanting more surgeries. States the birth control pill she was started on at last visit helps a bit but bleeding will stop for acouple days and then return. She is really bothered by the heavy bleeding and clotting. Taking oral iron every other day which was started at last visit as well. Denies feeling dizzy or lightheaded. Does feel decrease energy but this as precluded her bleeding. Not necessarily having a lot of pain with the bleeding, sometimes has cramping.    PERTINENT  PMH / PSH: Irregular bleeding  OBJECTIVE:   BP 122/85   Pulse 86   Ht 5\' 7"  (1.702 m)   Wt 198 lb 6.4 oz (90 kg)   LMP 05/17/2022 (Approximate)   SpO2 99%   BMI 31.07 kg/m    Physical exam General: well appearing, NAD Cardiovascular: RRR, no murmurs Lungs: CTAB. Normal WOB Abdomen: soft, non-distended, non-tender Skin: warm, dry. No edema  ASSESSMENT/PLAN:   Irregular menstrual bleeding Pelvic US performed 2 weeks ago with complex heterogeneous material and small amount of fluid. Fluid in canal could indicate a number of causes including infection. Last visit tested positive for Trich (completed Flagyl Rx) but G/C was not collected. Patient needing to leave appointment early today, did agree to urine G/C.  Will need repeat US in 5-11 weeks as recommended on ultrasound. Will also refer to Gyn for additional treatment options  as her bleeding is really affecting her quality of life. Will follow up with her in another month to recheck CBC and 3 months for TOC from Trich infection.     Drew

## 2022-06-09 NOTE — Assessment & Plan Note (Addendum)
Pelvic US performed 2 weeks ago with complex heterogeneous material and small amount of fluid. Fluid in canal could indicate a number of causes including infection. Last visit tested positive for Trich (completed Flagyl Rx) but G/C was not collected. Patient needing to leave appointment early today, did agree to urine G/C.  Will need repeat US in 5-11 weeks as recommended on ultrasound. Will also refer to Gyn for additional treatment options as her bleeding is really affecting her quality of life. Will follow up with her in another month to recheck CBC and 3 months for TOC from Trich infection.

## 2022-06-10 ENCOUNTER — Telehealth: Payer: Self-pay | Admitting: Family Medicine

## 2022-06-10 DIAGNOSIS — N926 Irregular menstruation, unspecified: Secondary | ICD-10-CM

## 2022-06-10 DIAGNOSIS — R102 Pelvic and perineal pain: Secondary | ICD-10-CM

## 2022-06-10 LAB — URINE CYTOLOGY ANCILLARY ONLY
Chlamydia: POSITIVE — AB
Comment: NEGATIVE
Comment: NORMAL
Neisseria Gonorrhea: NEGATIVE

## 2022-06-10 NOTE — Telephone Encounter (Signed)
Called patient to follow up ultrasound. Ordered follow up ultrasound

## 2022-06-11 ENCOUNTER — Other Ambulatory Visit: Payer: Self-pay | Admitting: Family Medicine

## 2022-06-11 MED ORDER — DOXYCYCLINE HYCLATE 100 MG PO TABS: 100.0000 mg | ORAL_TABLET | Freq: Two times a day (BID) | ORAL | 0 refills | Status: AC

## 2022-06-17 ENCOUNTER — Ambulatory Visit (INDEPENDENT_AMBULATORY_CARE_PROVIDER_SITE_OTHER): Payer: Medicaid Other

## 2022-06-17 DIAGNOSIS — Z30019 Encounter for initial prescription of contraceptives, unspecified: Secondary | ICD-10-CM

## 2022-06-17 NOTE — Progress Notes (Unsigned)
Patient here today for Depo Provera injection and is within her dates.    Last contraceptive appt was 06/07/2022  Depo given in [place] today.  Site unremarkable & patient tolerated injection.    Next injection due 09/02/22-09/16/22.  Reminder card given.    Talbot Grumbling, RN

## 2022-07-12 ENCOUNTER — Encounter: Payer: Self-pay | Admitting: Family Medicine

## 2022-07-12 ENCOUNTER — Ambulatory Visit (INDEPENDENT_AMBULATORY_CARE_PROVIDER_SITE_OTHER): Payer: Self-pay | Admitting: Family Medicine

## 2022-07-12 VITALS — BP 129/80 | HR 77 | Wt 201.6 lb

## 2022-07-12 DIAGNOSIS — D649 Anemia, unspecified: Secondary | ICD-10-CM

## 2022-07-12 NOTE — Assessment & Plan Note (Addendum)
Hgb 8.4 one month ago. Has been taking po iron with improvement in her symptoms. Will continue iron and recheck CBC and iron panel. Suspect SOB 2/2 deconditioning. Recommended starting incorporating physical activity.

## 2022-07-12 NOTE — Progress Notes (Signed)
    SUBJECTIVE:   CHIEF COMPLAINT / HPI:   Patient presents to follow up on anemia.  Hemoglobin on 3/11 was 8.4 and she was started on iron 325mg  every other day. Feeling improvement in energy level since taking iron. Also on OCP. States she is no longer bleeding. Still feels a little short of breath. Has not been exercising. Denies any other concerns  PERTINENT  PMH / PSH: Reviewed   OBJECTIVE:   BP 129/80   Pulse 77   Wt 201 lb 9.6 oz (91.4 kg)   SpO2 98%   BMI 31.58 kg/m    Physical exam General: well appearing, NAD Cardiovascular: RRR, no murmurs Lungs: CTAB. Normal WOB Abdomen: soft, non-distended, non-tender Skin: warm, dry. No edema  ASSESSMENT/PLAN:   Anemia Hgb 8.4 one month ago. Has been taking po iron with improvement in her symptoms. Will continue iron and recheck CBC and iron panel. Suspect SOB 2/2 deconditioning. Recommended starting incorporating physical activity.    Will follow up in 2 months for STI check   Cora Collum, DO [facility]

## 2022-07-12 NOTE — Patient Instructions (Signed)
It was great seeing you today!  You came in to follow-up on your anemia, and we will recheck your blood and iron levels.  I will send a MyChart message with those results.  I recommend starting to incorporate exercise into your daily routine I think that will help with your breathing  Feel free to call with any questions or concerns at any time, at [phone].   Take care,  Dr. Cora Collum [facility]

## 2022-07-13 LAB — CBC
Hematocrit: 33.8 % — ABNORMAL LOW (ref 34.0–46.6)
Hemoglobin: 10.6 g/dL — ABNORMAL LOW (ref 11.1–15.9)
MCH: 26.1 pg — ABNORMAL LOW (ref 26.6–33.0)
MCHC: 31.4 g/dL — ABNORMAL LOW (ref 31.5–35.7)
MCV: 83 fL (ref 79–97)
Platelets: 304 10*3/uL (ref 150–450)
RBC: 4.06 x10E6/uL (ref 3.77–5.28)
RDW: 13.2 % (ref 11.7–15.4)
WBC: 6.3 10*3/uL (ref 3.4–10.8)

## 2022-07-13 LAB — IRON,TIBC AND FERRITIN PANEL
Ferritin: 36 ng/mL (ref 15–150)
Iron Saturation: 19 % (ref 15–55)
Iron: 73 ug/dL (ref 27–159)
Total Iron Binding Capacity: 388 ug/dL (ref 250–450)
UIBC: 315 ug/dL (ref 131–425)

## 2022-09-02 ENCOUNTER — Ambulatory Visit (INDEPENDENT_AMBULATORY_CARE_PROVIDER_SITE_OTHER): Payer: Self-pay

## 2022-09-02 ENCOUNTER — Ambulatory Visit: Payer: Self-pay

## 2022-09-02 DIAGNOSIS — Z3042 Encounter for surveillance of injectable contraceptive: Secondary | ICD-10-CM

## 2022-09-02 MED ORDER — MEDROXYPROGESTERONE ACETATE 150 MG/ML IM SUSY
150.0000 mg | PREFILLED_SYRINGE | Freq: Once | INTRAMUSCULAR | Status: AC
  Administered 2022-09-02: 150 mg via INTRAMUSCULAR

## 2022-09-02 NOTE — Progress Notes (Signed)
Patient here today for Depo Provera injection and is within her dates.    Last contraceptive appt was 06/07/2022  Depo given in [place] today.  Site unremarkable & patient tolerated injection.    Next injection due 11/18/22-12/02/22.  Reminder card given.    Veronda Prude, RN

## 2022-11-19 ENCOUNTER — Ambulatory Visit (INDEPENDENT_AMBULATORY_CARE_PROVIDER_SITE_OTHER): Payer: Self-pay

## 2022-11-19 DIAGNOSIS — Z3042 Encounter for surveillance of injectable contraceptive: Secondary | ICD-10-CM

## 2022-11-19 MED ORDER — MEDROXYPROGESTERONE ACETATE 150 MG/ML IM SUSY
150.0000 mg | PREFILLED_SYRINGE | Freq: Once | INTRAMUSCULAR | Status: AC
  Administered 2022-11-19: 150 mg via INTRAMUSCULAR

## 2022-11-19 NOTE — Progress Notes (Signed)
Patient here today for Depo Provera injection and is within her dates.    Last contraceptive appt was 06/07/2022  Depo given in [place] today.  Site unremarkable & patient tolerated injection.    Next injection due 02/04/23-02/18/23.  Reminder card given.    Veronda Prude, RN

## 2022-12-15 ENCOUNTER — Emergency Department (HOSPITAL_COMMUNITY): Payer: Self-pay

## 2022-12-15 ENCOUNTER — Emergency Department (HOSPITAL_COMMUNITY)
Admission: EM | Admit: 2022-12-15 | Discharge: 2022-12-15 | Disposition: A | Discharge status: Home / Self Care | Payer: Self-pay | Attending: Emergency Medicine | Admitting: Emergency Medicine

## 2022-12-15 ENCOUNTER — Other Ambulatory Visit: Payer: Self-pay

## 2022-12-15 DIAGNOSIS — W1830XA Fall on same level, unspecified, initial encounter: Secondary | ICD-10-CM | POA: Insufficient documentation

## 2022-12-15 DIAGNOSIS — S8011XA Contusion of right lower leg, initial encounter: Secondary | ICD-10-CM | POA: Insufficient documentation

## 2022-12-15 DIAGNOSIS — Y93I9 Activity, other involving external motion: Secondary | ICD-10-CM | POA: Insufficient documentation

## 2022-12-15 MED ORDER — IBUPROFEN 400 MG PO TABS
600.0000 mg | ORAL_TABLET | Freq: Once | ORAL | Status: AC
  Administered 2022-12-15: 600 mg via ORAL
  Filled 2022-12-15: qty 1

## 2022-12-15 NOTE — ED Provider Notes (Signed)
EMERGENCY DEPARTMENT AT [facility] Provider Note   CSN: [id number] Arrival date & time: 12/15/22  6295     History  Chief Complaint  Patient presents with   Leg Injury    [name] is a 32 y.o. female with past medical history significant for anemia, scoliosis who presents with concern for pain, bruising, swelling of right lower extremity after mechanical fall while she was moving items in the back of a truck.  She reports her ankle got caught she fell on her leg.  She denies any blood thinner use.  She reports that she has been ambulating on the leg for the last several days but the pain and swelling continued.  She has not tried anything for pain.  HPI     Home Medications Prior to Admission medications   Medication Sig Start Date End Date Taking? Authorizing Provider  ferrous sulfate 325 (65 FE) MG tablet TAKE 1 TABLET BY MOUTH EVERY OTHER DAY 05/30/22  Yes Paige, Lucas Mallow, DO  cyclobenzaprine (FLEXERIL) 10 MG tablet Take 1 tablet (10 mg total) by mouth 2 (two) times daily as needed for muscle spasms. 05/16/21   Margarita Grizzle, MD  methocarbamol (ROBAXIN) 500 MG tablet Take 1 tablet (500 mg total) by mouth every 8 (eight) hours as needed for muscle spasms. 07/06/21   Emelia Loron, MD  metroNIDAZOLE (FLAGYL) 500 MG tablet Take 1 tablet (500 mg total) by mouth 2 (two) times daily. 05/27/22   Lockie Mola, MD  norethindrone (MICRONOR) 0.35 MG tablet Take 1 tablet (0.35 mg total) by mouth daily. 05/27/22   Lockie Mola, MD      Allergies    Patient has no known allergies.    Review of Systems   Review of Systems  All other systems reviewed and are negative.   Physical Exam Updated Vital Signs BP (!) 144/100   Pulse 85   Temp 98.4 F (36.9 C)   Resp 18   Ht 5\' 7"  (1.702 m)   SpO2 99%   BMI 31.58 kg/m  Physical Exam Vitals and nursing note reviewed.  Constitutional:      General: She is not in acute distress.    Appearance: Normal  appearance.  HENT:     Head: Normocephalic and atraumatic.  Eyes:     General:        Right eye: No discharge.        Left eye: No discharge.  Cardiovascular:     Rate and Rhythm: Normal rate and regular rhythm.     Pulses: Normal pulses.     Comments: Intact, 2+ DP, PT pulses in the affected right lower extremity Pulmonary:     Effort: Pulmonary effort is normal. No respiratory distress.  Musculoskeletal:        General: No deformity.     Comments: Patient with some soft tissue swelling, and bruises that are appearing appropriately healed on the right anterior tibia/calf.  Skin:    General: Skin is warm and dry.     Capillary Refill: Capillary refill takes less than 2 seconds.  Neurological:     Mental Status: She is alert and oriented to person, place, and time.  Psychiatric:        Mood and Affect: Mood normal.        Behavior: Behavior normal.     ED Results / Procedures / Treatments   Labs (all labs ordered are listed, but only abnormal results are displayed) Labs Reviewed - No  data to display  EKG None  Radiology DG Tibia/Fibula Right  Result Date: 12/15/2022 CLINICAL DATA:  Larey Seat, pain EXAM: RIGHT TIBIA AND FIBULA - 2 VIEW COMPARISON:  None Available. FINDINGS: There is no evidence of fracture or other focal bone lesions. Soft tissues are unremarkable. IMPRESSION: Negative. Electronically Signed   By: Corlis Leak M.D.   On: 12/15/2022 10:15    Procedures Procedures    Medications Ordered in ED Medications  ibuprofen (ADVIL) tablet 600 mg (600 mg Oral Given 12/15/22 1027)    ED Course/ Medical Decision Making/ A&P                                 Medical Decision Making Amount and/or Complexity of Data Reviewed Radiology: ordered.   This patient is a 32 y.o. female who presents to the ED for concern of  leg pain, swelling after mechanical fall.   Differential diagnoses prior to evaluation: Fracture, dislocation, percent soft tissue bruising,  contusion  Past Medical History / Social History / Additional history: Chart reviewed. Pertinent results include: overall noncontributory  Physical Exam: Physical exam performed. The pertinent findings include: Patient with some soft tissue swelling, and bruises that are appearing appropriately healed on the right anterior tibia/calf.  Intact, 2+ DP, PT pulses in the affected right lower extremity  Medications / Treatment: Ibuprofen for pain  I independently interpreted imaging including plain film radiograph of the right tibia which shows no evidence of fracture, dislocation. I agree with the radiologist interpretation.   Disposition: After consideration of the diagnostic results and the patients response to treatment, I feel that patient is stable for discharge, suspect contusion of the right lower extremity, discussed that swelling and pain will continue to improve with ibuprofen, Tylenol, elevation.   emergency department workup does not suggest an emergent condition requiring admission or immediate intervention beyond what has been performed at this time. The plan is: as above. The patient is safe for discharge and has been instructed to return immediately for worsening symptoms, change in symptoms or any other concerns.  Final Clinical Impression(s) / ED Diagnoses Final diagnoses:  Contusion of right lower extremity, initial encounter    Rx / DC Orders ED Discharge Orders     None         Olene Floss, PA-C 12/15/22 1025    Anders Simmonds T, DO 12/16/22 [phone]

## 2022-12-15 NOTE — Discharge Instructions (Signed)
Please use Tylenol or ibuprofen for pain.  You may use 600 mg ibuprofen every 6 hours or 1000 mg of Tylenol every 6 hours.  You may choose to alternate between the 2.  This would be most effective.  Not to exceed 4 g of Tylenol within 24 hours.  Not to exceed 3200 mg ibuprofen 24 hours.  

## 2022-12-15 NOTE — ED Triage Notes (Signed)
Patient arrives for eval of pain, bruising, and swelling to RLE after a mechanical fall on Tuesday where she was moving items in the back of a truck, her ankle got caught, and she fell on her leg. Not on any blood thinners.

## 2023-01-12 ENCOUNTER — Telehealth: Payer: Self-pay | Admitting: Family

## 2023-01-12 NOTE — Progress Notes (Signed)
Discussed we can not do a physical virtually at this time. Will no charge this visit.   Jannifer Rodney, FNP

## 2023-02-17 ENCOUNTER — Ambulatory Visit: Payer: Medicaid Other

## 2023-02-17 DIAGNOSIS — Z3042 Encounter for surveillance of injectable contraceptive: Secondary | ICD-10-CM

## 2023-02-17 MED ORDER — MEDROXYPROGESTERONE ACETATE 150 MG/ML IM SUSP: 150.0000 mg | Freq: Once | INTRAMUSCULAR | Status: AC

## 2023-02-17 NOTE — Progress Notes (Signed)
Patient here today for Depo Provera injection and is within her dates.     Last contraceptive appt was 06/07/2022.   Depo given in [place] today.  Site unremarkable & patient tolerated injection.     Next injection due 05/05/2023-05/19/2023.    Reminder card given.

## 2023-05-08 ENCOUNTER — Encounter: Payer: Self-pay | Admitting: Family Medicine

## 2023-05-08 ENCOUNTER — Ambulatory Visit (INDEPENDENT_AMBULATORY_CARE_PROVIDER_SITE_OTHER): Payer: Medicaid Other | Admitting: Family Medicine

## 2023-05-08 VITALS — Ht 67.0 in | Wt 210.2 lb

## 2023-05-08 DIAGNOSIS — Z0000 Encounter for general adult medical examination without abnormal findings: Secondary | ICD-10-CM

## 2023-05-08 DIAGNOSIS — Z3042 Encounter for surveillance of injectable contraceptive: Secondary | ICD-10-CM | POA: Diagnosis present

## 2023-05-08 MED ORDER — MEDROXYPROGESTERONE ACETATE 150 MG/ML IM SUSP
150.0000 mg | Freq: Once | INTRAMUSCULAR | Status: AC
  Administered 2023-05-08: 150 mg via INTRAMUSCULAR

## 2023-05-08 NOTE — Progress Notes (Signed)
 Patient presented for physical. Due to weather has to get daughter from school.  Depo given. Will reschedule annual exam

## 2023-05-25 ENCOUNTER — Encounter (HOSPITAL_COMMUNITY): Payer: Self-pay

## 2023-05-25 ENCOUNTER — Ambulatory Visit (HOSPITAL_COMMUNITY)
Admission: EM | Admit: 2023-05-25 | Discharge: 2023-05-25 | Disposition: A | Discharge status: Home / Self Care | Attending: Family Medicine | Admitting: Family Medicine

## 2023-05-25 ENCOUNTER — Ambulatory Visit (INDEPENDENT_AMBULATORY_CARE_PROVIDER_SITE_OTHER)

## 2023-05-25 DIAGNOSIS — M25562 Pain in left knee: Secondary | ICD-10-CM | POA: Diagnosis not present

## 2023-05-25 DIAGNOSIS — Z23 Encounter for immunization: Secondary | ICD-10-CM

## 2023-05-25 DIAGNOSIS — S80212A Abrasion, left knee, initial encounter: Secondary | ICD-10-CM | POA: Diagnosis not present

## 2023-05-25 MED ORDER — KETOROLAC TROMETHAMINE 30 MG/ML IJ SOLN
30.0000 mg | Freq: Once | INTRAMUSCULAR | Status: AC
Start: 2023-05-25 — End: 2023-05-25
  Administered 2023-05-25: 30 mg via INTRAMUSCULAR

## 2023-05-25 MED ORDER — KETOROLAC TROMETHAMINE 30 MG/ML IJ SOLN
INTRAMUSCULAR | Status: AC
  Filled 2023-05-25: qty 1

## 2023-05-25 MED ORDER — IBUPROFEN 800 MG PO TABS: 800.0000 mg | ORAL_TABLET | Freq: Three times a day (TID) | ORAL | 0 refills | Status: AC | PRN

## 2023-05-25 MED ORDER — TETANUS-DIPHTH-ACELL PERTUSSIS 5-2.5-18.5 LF-MCG/0.5 IM SUSY
0.5000 mL | PREFILLED_SYRINGE | Freq: Once | INTRAMUSCULAR | Status: AC
Start: 2023-05-25 — End: 2023-05-25
  Administered 2023-05-25: 0.5 mL via INTRAMUSCULAR

## 2023-05-25 MED ORDER — TETANUS-DIPHTH-ACELL PERTUSSIS 5-2.5-18.5 LF-MCG/0.5 IM SUSY
PREFILLED_SYRINGE | INTRAMUSCULAR | Status: AC
  Filled 2023-05-25: qty 0.5

## 2023-05-25 NOTE — Discharge Instructions (Signed)
 X-ray is read as normal.  You have been given a shot of Toradol 30 mg today.  Take ibuprofen 800 mg--1 tab every 8 hours as needed for pain.  You have been given a Tdap vaccination to boost your tetanus immunity  Ice and elevate your knee.
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
## 2023-07-08 ENCOUNTER — Other Ambulatory Visit: Payer: Self-pay

## 2023-07-08 DIAGNOSIS — D649 Anemia, unspecified: Secondary | ICD-10-CM

## 2023-07-09 MED ORDER — FERROUS SULFATE 325 (65 FE) MG PO TABS: 325.0000 mg | ORAL_TABLET | ORAL | 2 refills | Status: AC

## 2023-08-01 ENCOUNTER — Encounter: Payer: Self-pay | Admitting: Family Medicine

## 2023-08-01 ENCOUNTER — Ambulatory Visit: Admitting: Family Medicine

## 2023-08-01 VITALS — BP 122/78 | HR 80 | Ht 67.0 in | Wt 215.4 lb

## 2023-08-01 DIAGNOSIS — E66811 Obesity, class 1: Secondary | ICD-10-CM | POA: Diagnosis not present

## 2023-08-01 DIAGNOSIS — Z3042 Encounter for surveillance of injectable contraceptive: Secondary | ICD-10-CM | POA: Diagnosis not present

## 2023-08-01 DIAGNOSIS — D649 Anemia, unspecified: Secondary | ICD-10-CM | POA: Diagnosis present

## 2023-08-01 DIAGNOSIS — Z6833 Body mass index (BMI) 33.0-33.9, adult: Secondary | ICD-10-CM | POA: Diagnosis not present

## 2023-08-01 MED ORDER — MEDROXYPROGESTERONE ACETATE 150 MG/ML IM SUSP
150.0000 mg | Freq: Once | INTRAMUSCULAR | Status: AC
  Administered 2023-08-01: 150 mg via INTRAMUSCULAR

## 2023-08-01 NOTE — Assessment & Plan Note (Signed)
 Patient deferred rectal exam today. Denies blood in stool, low suspicion for GI bleed. Given unknown family history, will order INR to r/o concern for clotting disorders. Patient had clot seen in previous US , which recommended follow up. Will order TVUS as this was not done.  - CBC - Iron  panel - INR - Transvaginal ultrasound

## 2023-08-01 NOTE — Progress Notes (Signed)
 Patient here today for Depo Provera  injection and is within her dates.    Received orders from Dr. Derril Flint for depo injection.  Depo given in RUOQ today.  Site unremarkable & patient tolerated injection.    Next injection due 10/17/23-10/31/23.  Reminder card given.    Elsie Halo, RN

## 2023-08-01 NOTE — Progress Notes (Signed)
    SUBJECTIVE:   Chief compliant/HPI: annual examination  [name] is a 33 y.o. who presents today for an annual exam.   Review of systems form notable for mild fatigue and some spotting on toilet paper when wiping very rarely.   Patient does have history of anemia, thought to 2/2 to AUB. Patient was taking iron  supplement but stopped. Denies any vaginal bleeding, no clotting. Does not have family history d/t being adopted. Endorses maybe some bleeding with brushing teeth, increased bleeding with cuts and nose bleeds as a child.   Updated history tabs and problem list .   OBJECTIVE:   BP 122/78   Pulse 80   Ht 5\' 7"  (1.702 m)   Wt 215 lb 6.4 oz (97.7 kg)   SpO2 99%   BMI 33.74 kg/m   General: A&O, NAD Skin: no bruising observed on exam  HEENT: No sign of trauma, EOM grossly intact Cardiac: RRR, flow systolic murmur  Respiratory: CTAB, normal WOB, no w/c/r GI: Soft, NTTP, non-distended  Extremities: NTTP, no peripheral edema. Neuro: Normal gait, moves all four extremities appropriately. Psych: Appropriate mood and affect  ASSESSMENT/PLAN:   Assessment & Plan Anemia, unspecified type Patient deferred rectal exam today. Denies blood in stool, low suspicion for GI bleed. Given unknown family history, will order INR to r/o concern for clotting disorders. Patient had clot seen in previous US , which recommended follow up. Will order TVUS as this was not done.  - CBC - Iron  panel - INR - Transvaginal ultrasound  Class 1 obesity with body mass index (BMI) of 33.0 to 33.9 in adult, unspecified obesity type, unspecified whether serious comorbidity present - Lipid panel   Annual Examination  See AVS for age appropriate recommendations.   PHQ score 2, reviewed and discussed. Blood pressure reviewed and at goal .  The patient currently uses Depo for contraception   Considered the following items based upon USPSTF recommendations: HIV testing: discussed Hepatitis C:  discussed Hepatitis B: discussed Syphilis if at high risk: discussed GC/CT not at high risk and not ordered. Lipid panel (nonfasting or fasting) discussed based upon AHA recommendations and ordered.  Consider repeat every 4-6 years.  Reviewed risk factors for latent tuberculosis and not indicated  Discussed family history, BRCA testing not indicated.   Cervical cancer screening: prior Pap reviewed, repeat due in 4-6 months  Immunizations   MyChart Activation:Already signed up  Follow up in 4-6 months for pap smear.    [name] Naas, MD [facility]

## 2023-08-01 NOTE — Patient Instructions (Addendum)
 It was nice to meet you!  I am checking some lab work. I will call you if those results are abnormal. We will also check another image of your uterus to make sure there isnt any bleeding or concerns there. We will call you to schedule that appointment.   Lets plan to get these labs and follow up in 2-3 weeks.   Things to do to Keep yourself Healthy - Exercise at least 30-45 minutes a day, 3-4 days a week. >150 min of moderate intensity per week is advised. - Eat a low-fat diet with lots of fruits and vegetables, up to 7-9 servings per day. - Seatbelts can save your life. Wear them always. - Smoke detectors on every level of your home, check batteries every year. - Eye Doctor - have an eye exam every 1-2 years - Safe sex - if you may be exposed to STDs, use a condom. - Alcohol If you drink, do it moderately, less than 1 drink per day. - Health Care Power of Attorney.  Choose someone to speak for you if you are not able. - Depression is common in our stressful world.If you're feeling down or losing interest in things you normally enjoy, please come in for a visit. - Violence - If anyone is threatening or hurting you, please call immediately.

## 2023-08-05 ENCOUNTER — Other Ambulatory Visit: Payer: Self-pay

## 2023-08-05 DIAGNOSIS — D649 Anemia, unspecified: Secondary | ICD-10-CM

## 2023-08-05 DIAGNOSIS — E66811 Obesity, class 1: Secondary | ICD-10-CM

## 2023-08-06 ENCOUNTER — Telehealth: Payer: Self-pay

## 2023-08-06 LAB — LIPID PANEL
Chol/HDL Ratio: 3.3 ratio (ref 0.0–4.4)
Cholesterol, Total: 171 mg/dL (ref 100–199)
HDL: 52 mg/dL (ref >39)
LDL Chol Calc (NIH): 106 mg/dL — ABNORMAL HIGH (ref 0–99)
Triglycerides: 70 mg/dL (ref 0–149)
VLDL Cholesterol Cal: 13 mg/dL (ref 5–40)

## 2023-08-06 LAB — PROTIME-INR
INR: 1 (ref 0.9–1.2)
Prothrombin Time: 10.8 s (ref 9.1–12.0)

## 2023-08-06 LAB — IRON,TIBC AND FERRITIN PANEL
Ferritin: 143 ng/mL (ref 15–150)
Iron Saturation: 23 % (ref 15–55)
Iron: 74 ug/dL (ref 27–159)
Total Iron Binding Capacity: 320 ug/dL (ref 250–450)
UIBC: 246 ug/dL (ref 131–425)

## 2023-08-06 LAB — CBC
Hematocrit: 38.5 % (ref 34.0–46.6)
Hemoglobin: 12.3 g/dL (ref 11.1–15.9)
MCH: 27.1 pg (ref 26.6–33.0)
MCHC: 31.9 g/dL (ref 31.5–35.7)
MCV: 85 fL (ref 79–97)
Platelets: 274 10*3/uL (ref 150–450)
RBC: 4.54 x10E6/uL (ref 3.77–5.28)
RDW: 13.3 % (ref 11.7–15.4)
WBC: 7.4 10*3/uL (ref 3.4–10.8)

## 2023-08-06 NOTE — Telephone Encounter (Signed)
 Patient calls nurse line requesting lab results.   She reports you can send her a mychart message once reviewed.   Will forward to provider saw patient.

## 2023-08-07 ENCOUNTER — Ambulatory Visit: Payer: Self-pay | Admitting: Family Medicine

## 2023-08-08 ENCOUNTER — Ambulatory Visit
Admission: RE | Admit: 2023-08-08 | Discharge: 2023-08-08 | Disposition: A | Discharge status: Home / Self Care | Source: Ambulatory Visit | Attending: Family Medicine | Admitting: Family Medicine

## 2023-08-08 DIAGNOSIS — D649 Anemia, unspecified: Secondary | ICD-10-CM

## 2023-10-24 ENCOUNTER — Ambulatory Visit

## 2023-10-24 DIAGNOSIS — Z3042 Encounter for surveillance of injectable contraceptive: Secondary | ICD-10-CM

## 2023-10-24 IMAGING — CT CT ABD-PELV W/ CM
2 of 4 series · 15 of 46 positions shown, 17 images · IV contrast (APPLIED)
Comparison: None.

CLINICAL DATA: Right lower quadrant abdominal pain

EXAM:
CT ABDOMEN AND PELVIS WITH CONTRAST
TECHNIQUE: Multidetector CT imaging of the abdomen and pelvis was performed
using the standard protocol following bolus administration of
intravenous contrast.

[Series 3: abd/ pelvis 5.0 i30f 2 · axial · 0.80mm/px · z∈[+916,+1336]mm · 12 of 96 slices shown, 14 images]
[im 8/96  soft-tissue]
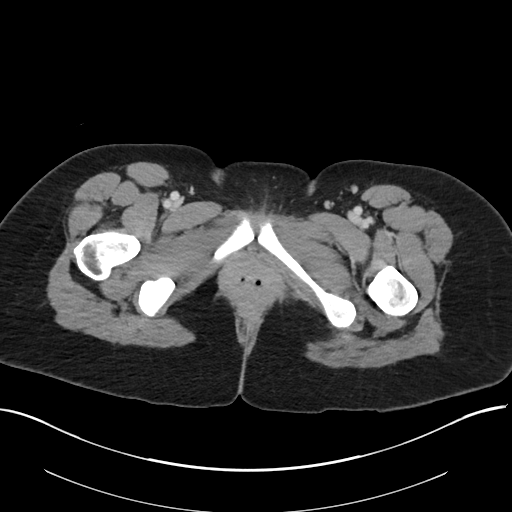
[im 8/96  bone]
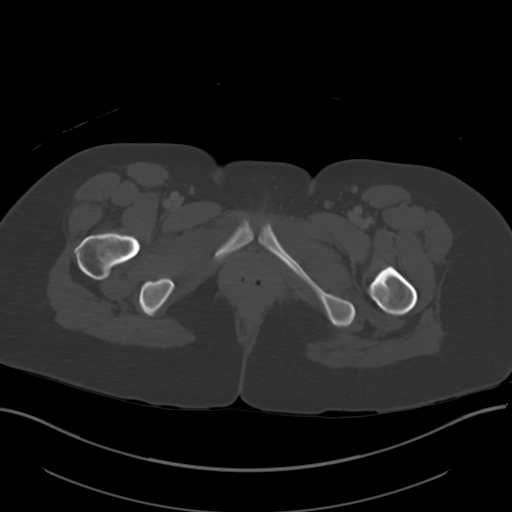
[im 16/96  soft-tissue]
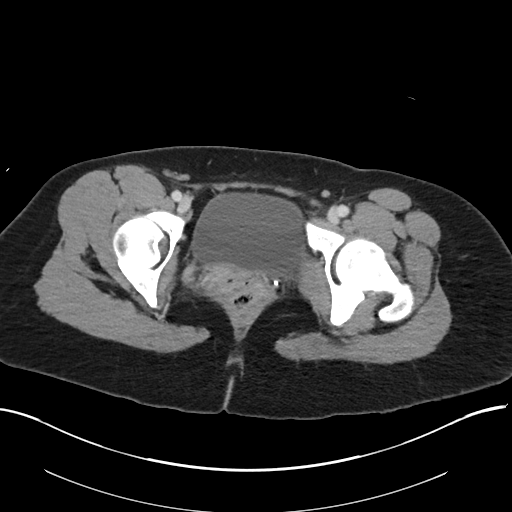
[im 23/96  soft-tissue]
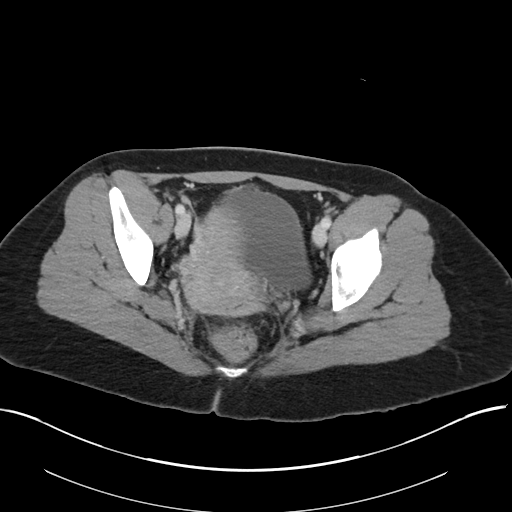
[im 31/96  soft-tissue]
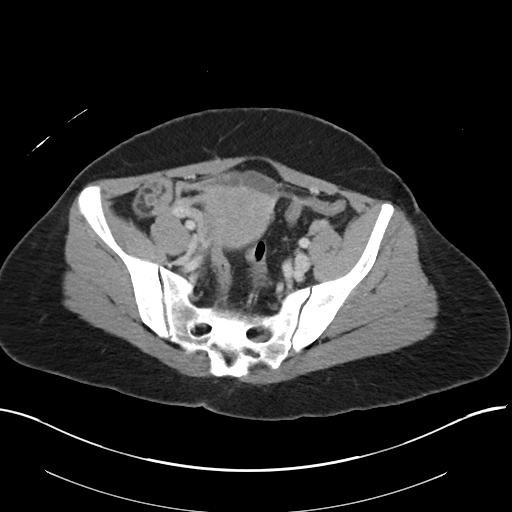
[im 39/96  soft-tissue]
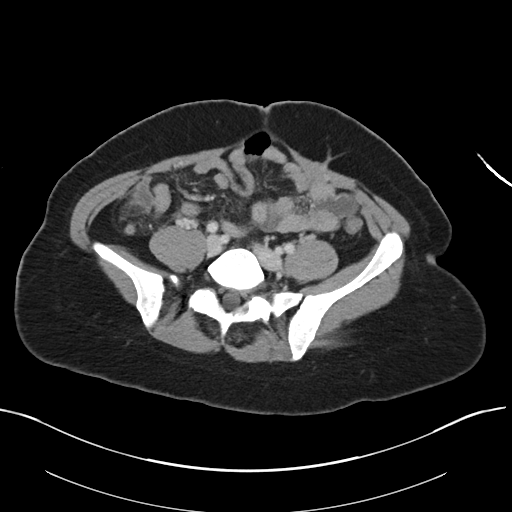
[im 46/96  soft-tissue]
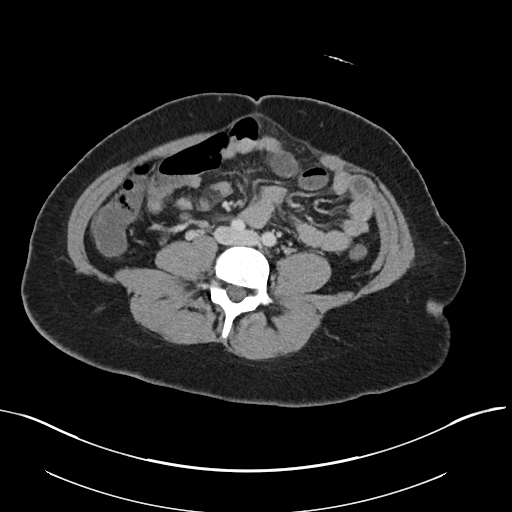
[im 54/96  soft-tissue]
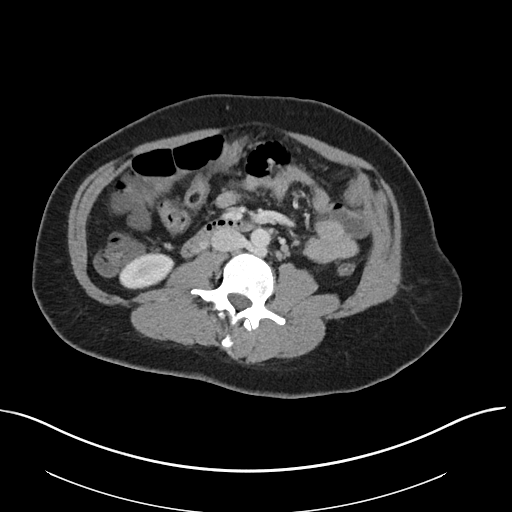
[im 61/96  soft-tissue]
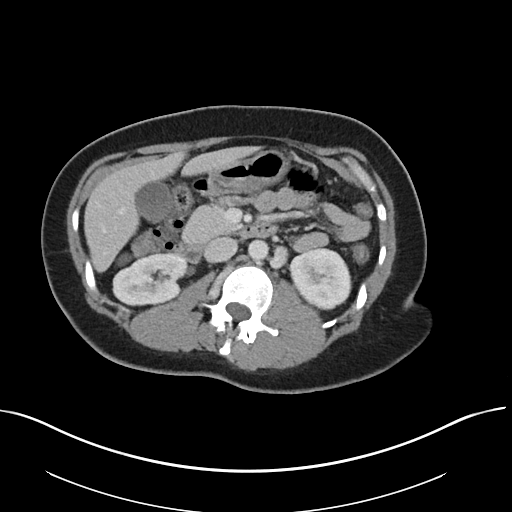
[im 69/96  soft-tissue]
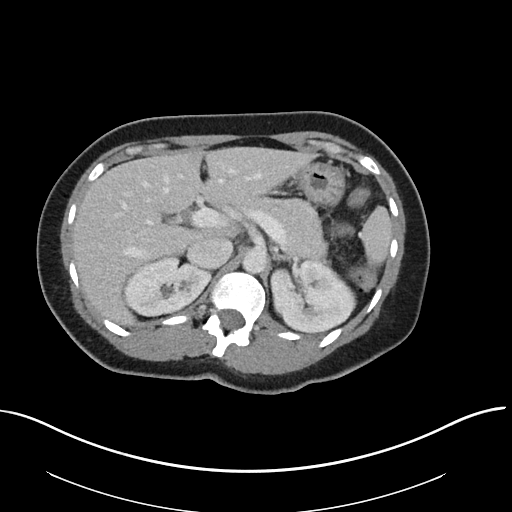
[im 69/96  bone]
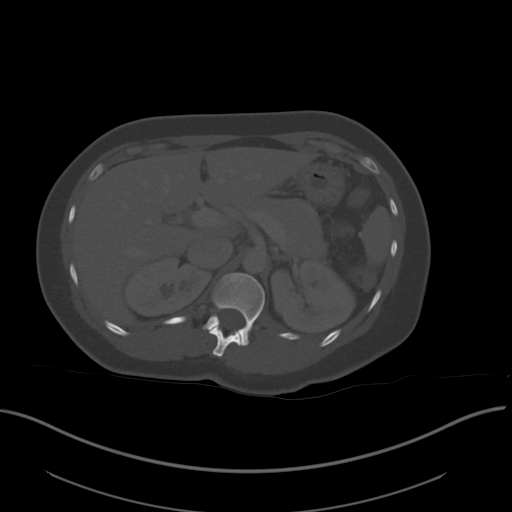
[im 77/96  soft-tissue]
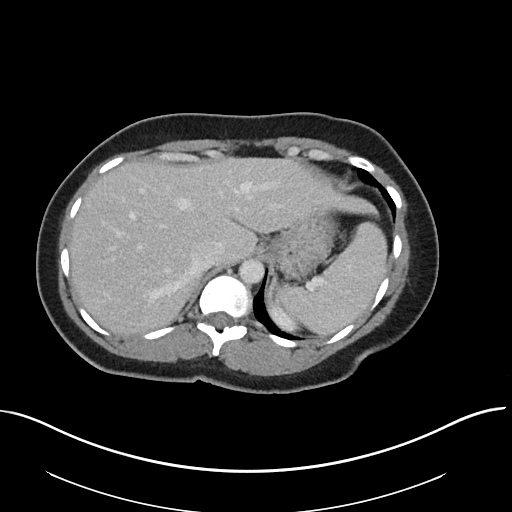
[im 84/96  soft-tissue]
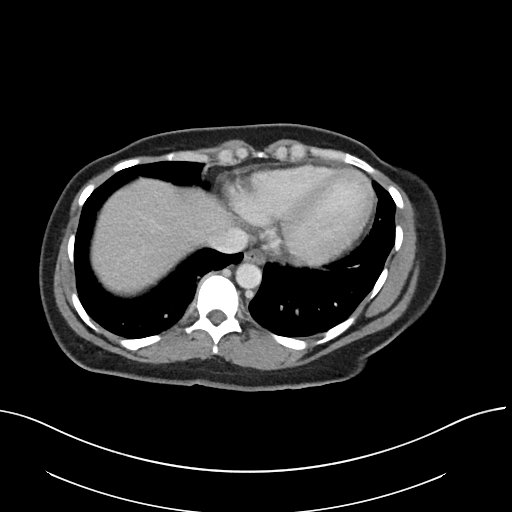
[im 92/96  soft-tissue]
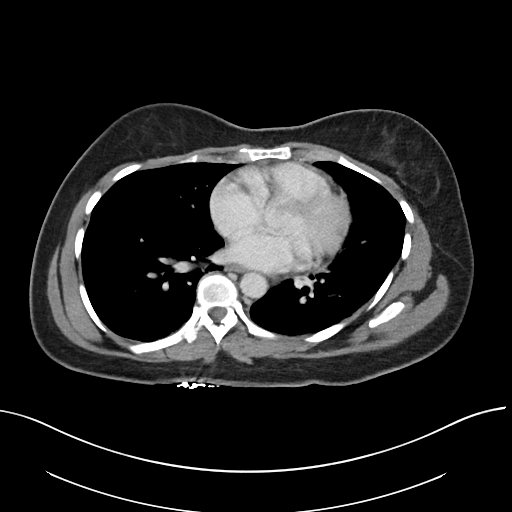

[Series 6: coronal soft tissue · coronal · 0.74mm/px · 3 of 98 slices shown]
[im 33/98  soft-tissue]
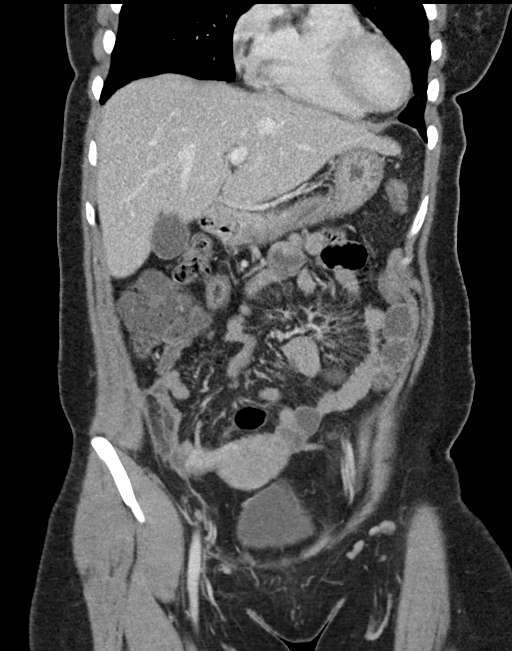
[im 44/98  soft-tissue]
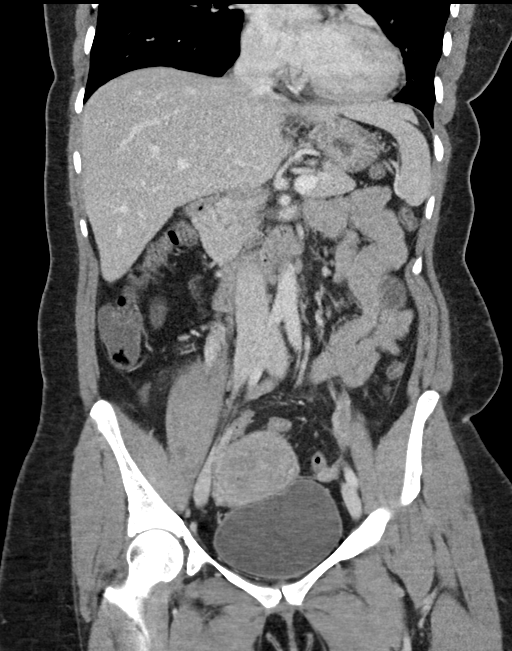
[im 54/98  soft-tissue]
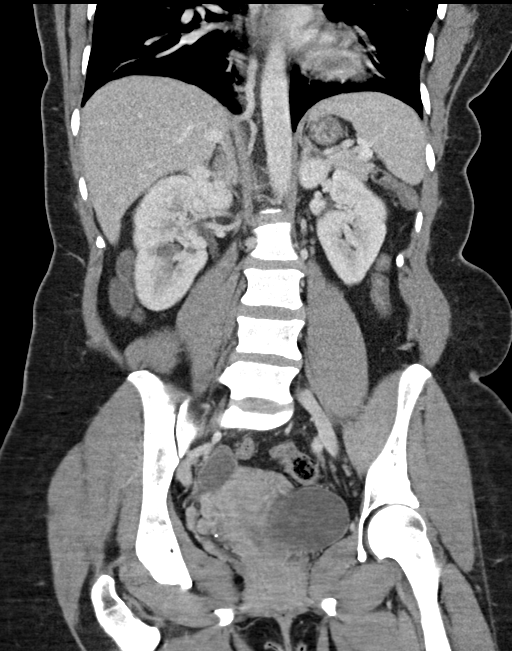

[15 of 46 positions shown; findings below may reference images not displayed]

RADIATION DOSE REDUCTION: This exam was performed according to the
departmental dose-optimization program which includes automated
exposure control, adjustment of the mA and/or kV according to
patient size and/or use of iterative reconstruction technique.

CONTRAST:  100mL OMNIPAQUE IOHEXOL 300 MG/ML  SOLN
FINDINGS: Lower chest: No acute abnormality.

Hepatobiliary: No focal liver abnormality is seen. No gallstones,
gallbladder wall thickening, or biliary dilatation.

Pancreas: Unremarkable. No pancreatic ductal dilatation or
surrounding inflammatory changes.

Spleen: Normal in size without focal abnormality.

Adrenals/Urinary Tract: Adrenal glands are unremarkable. No evidence
of hydronephrosis or nephrolithiasis. Linear area of hypoattenuation
involving the upper pole the left kidney, likely scarring.
Low-attenuation lesion of the lower pole of the right kidney which
is too small to accurately characterize, likely a simple cyst, no
further follow-up imaging is recommended bladder is unremarkable.

Stomach/Bowel: Dilated appendix with mucosal hyperenhancement and
surrounding inflammatory change. A small amount of fluid is seen in
the right lower quadrant adjacent to the appendix which is likely
reactive. No organized fluid collections. Stomach is within normal
limits. No evidence of bowel wall thickening, distention, or
inflammatory changes.

Vascular/Lymphatic: No significant vascular findings are present.
Incidental note is made of a left retroaortic renal vein. No
enlarged abdominal or pelvic lymph nodes.

Reproductive: Uterus is unremarkable. Simple appearing cyst of the
right adnexa measuring 3.3 cm, likely physiologic with no further
follow-up imaging recommended.

Other: Rectus diastasis.  No intra-abdominal free air.

Musculoskeletal: No acute or significant osseous findings.
IMPRESSION: Dilated appendix with mucosal hyperenhancement and surrounding
inflammatory change, findings are compatible with acute
appendicitis. A small amount of fluid is seen in the right lower
quadrant adjacent to the appendix which is likely reactive. No
organized fluid collections.

## 2023-10-24 MED ORDER — MEDROXYPROGESTERONE ACETATE 150 MG/ML IM SUSP
150.0000 mg | Freq: Once | INTRAMUSCULAR | Status: AC
  Administered 2023-10-24: 150 mg via INTRAMUSCULAR

## 2023-10-27 NOTE — Progress Notes (Signed)
 Patient here today for Depo Provera  injection and is within her dates.    Last contraceptive appt was 08/01/2023  Depo given in LUOQ today.  Site unremarkable & patient tolerated injection.    Next injection due 01/09/24-01/23/24.  Reminder card given.    Chiquita JAYSON English, RN

## 2023-12-19 ENCOUNTER — Ambulatory Visit

## 2023-12-19 DIAGNOSIS — Z111 Encounter for screening for respiratory tuberculosis: Secondary | ICD-10-CM

## 2023-12-19 NOTE — Progress Notes (Signed)
 Patient is here for a PPD placement.  PPD placed in left forearm @ 2:00 pm.  Patient will return 12/22/2023 to have PPD read. Chiquita JAYSON English, RN

## 2023-12-22 ENCOUNTER — Ambulatory Visit

## 2023-12-22 DIAGNOSIS — Z111 Encounter for screening for respiratory tuberculosis: Secondary | ICD-10-CM

## 2023-12-22 NOTE — Progress Notes (Signed)
PPD Reading Note PPD read and results entered in EpicCare. Result: 0 mm induration. Interpretation: Negative Allergic reaction: No  

## 2024-02-04 ENCOUNTER — Encounter: Payer: Self-pay | Admitting: Family Medicine

## 2024-02-05 LAB — TB SKIN TEST
Induration: 0 mm
TB Skin Test: NEGATIVE

## 2024-02-06 ENCOUNTER — Encounter: Payer: Self-pay | Admitting: Family Medicine

## 2024-02-06 ENCOUNTER — Ambulatory Visit: Payer: Self-pay | Admitting: Family Medicine

## 2024-02-06 ENCOUNTER — Ambulatory Visit (INDEPENDENT_AMBULATORY_CARE_PROVIDER_SITE_OTHER): Admitting: Family Medicine

## 2024-02-06 VITALS — BP 130/80 | HR 74 | Ht 67.0 in | Wt 213.0 lb

## 2024-02-06 DIAGNOSIS — R7303 Prediabetes: Secondary | ICD-10-CM | POA: Insufficient documentation

## 2024-02-06 DIAGNOSIS — Z3042 Encounter for surveillance of injectable contraceptive: Secondary | ICD-10-CM | POA: Diagnosis not present

## 2024-02-06 DIAGNOSIS — D649 Anemia, unspecified: Secondary | ICD-10-CM

## 2024-02-06 DIAGNOSIS — Z0000 Encounter for general adult medical examination without abnormal findings: Secondary | ICD-10-CM | POA: Diagnosis not present

## 2024-02-06 DIAGNOSIS — E1169 Type 2 diabetes mellitus with other specified complication: Secondary | ICD-10-CM

## 2024-02-06 DIAGNOSIS — E119 Type 2 diabetes mellitus without complications: Secondary | ICD-10-CM | POA: Insufficient documentation

## 2024-02-06 LAB — POCT GLYCOSYLATED HEMOGLOBIN (HGB A1C): HbA1c, POC (prediabetic range): 7.1 % — AB (ref 5.7–6.4)

## 2024-02-06 LAB — POCT URINE PREGNANCY: Preg Test, Ur: NEGATIVE

## 2024-02-06 MED ORDER — METFORMIN HCL ER 500 MG PO TB24
500.0000 mg | ORAL_TABLET | Freq: Every day | ORAL | 3 refills | Status: AC
Start: 2024-02-06 — End: ?

## 2024-02-06 MED ORDER — MEDROXYPROGESTERONE ACETATE 150 MG/ML IM SUSP
150.0000 mg | Freq: Once | INTRAMUSCULAR | Status: AC
  Administered 2024-02-06: 150 mg via INTRAMUSCULAR

## 2024-02-06 NOTE — Progress Notes (Signed)
    SUBJECTIVE:   Chief compliant/HPI: annual examination  [name] is a 33 y.o. who presents today for an annual exam.   Depo - Gets occasional spotting - Wants her next dose, she is late. - Does not have concerns for pregnancy at this time.    Updated history tabs and problem list.   OBJECTIVE:   BP (!) 140/70   Pulse 74   Ht 5' 7 (1.702 m)   Wt 213 lb (96.6 kg)   SpO2 98%   BMI 33.36 kg/m   General: Alert, pleasant woman. NAD. HEENT: NCAT. MMM. CV: RRR, no murmurs.   Resp: CTAB, no wheezing or crackles. Normal WOB on RA.   Ext: Moves all ext spontaneously Skin: Warm, well perfused   ASSESSMENT/PLAN:   Assessment & Plan Prediabetes A1c 5.7 a year ago. Discussed lifestyle modifications. - Will recheck A1c today - Recommended starting a daily vitamin D supplement - Provided handout on diabetes diet changes Anemia, unspecified type Hgb and ferritin previously normal. Pt reports some dyspnea with exertion. Will recheck ferritin today. - Cont daily iron  pills Encounter for management and injection of depo-Provera  Check Upreg today, advised to check a home Upreg in 2 weeks. If positive, advised to return to clinic.  - Recommended prn ibuprofen  if breakthrough bleeding occurs Annual physical exam   Annual Examination  See AVS for age appropriate recommendations.   Cervical cancer screening: UTD  MyChart Activation:Already signed up   Follow up in  4 months or sooner if indicated.    Twyla Nearing, MD [facility]

## 2024-02-06 NOTE — Assessment & Plan Note (Addendum)
 A1c 5.7 a year ago. Discussed lifestyle modifications. - Will recheck A1c today - Recommended starting a daily vitamin D supplement - Provided handout on diabetes diet changes

## 2024-02-06 NOTE — Assessment & Plan Note (Addendum)
 Hgb and ferritin previously normal. Pt reports some dyspnea with exertion. Will recheck ferritin today. - Cont daily iron  pills

## 2024-02-06 NOTE — Patient Instructions (Addendum)
 1) I will check a few labs for you including your iron  levels and your A1c.  2) For your prediabetes - Start a daily 1000IU Vitamin D supplement. This can slow the progression of prediabetes. - Try to incorporate 30-17min of aerobic exercise 5 times a week. This can include walking, jogging, biking, etc.  - Avoid eating too many sweets, sugary foods. Soda and juices are very high in sugar and should be enjoyed in moderation.   3) You are late for your Depo shot. We will give a shot today and check a urine pregnancy test. I recommend checking another urine pregnancy test at home or in our office in 2 weeks. If it is positive, please let us  know.

## 2024-02-07 LAB — FERRITIN: Ferritin: 158 ng/mL — ABNORMAL HIGH (ref 15–150)

## 2024-02-17 ENCOUNTER — Ambulatory Visit: Admitting: Family Medicine

## 2024-02-17 VITALS — BP 139/87 | HR 75 | Ht 67.0 in | Wt 213.6 lb

## 2024-02-17 DIAGNOSIS — E1169 Type 2 diabetes mellitus with other specified complication: Secondary | ICD-10-CM | POA: Diagnosis present

## 2024-02-17 DIAGNOSIS — E119 Type 2 diabetes mellitus without complications: Secondary | ICD-10-CM

## 2024-02-17 MED ORDER — SEMAGLUTIDE(0.25 OR 0.5MG/DOS) 2 MG/1.5ML ~~LOC~~ SOPN: 0.2500 mg | PEN_INJECTOR | SUBCUTANEOUS | 3 refills | Status: AC

## 2024-02-17 NOTE — Progress Notes (Unsigned)
    SUBJECTIVE:   CHIEF COMPLAINT / HPI:   Discussed the use of AI scribe software for clinical note transcription with the patient, who gave verbal consent to proceed.  History of Present Illness [name] is a 33 year old female who presents for management of newly diagnosed diabetes.  Hyperglycemia and diabetes management - Diagnosed with diabetes with hemoglobin A1c slightly above target of less than 7%. - Currently taking metformin  previously prescribed.  - Open to dietary modifications to improve glycemic control - Utilizes sugar substitutes such as Truvia and is considering low-sugar beverage alternatives. - Considering use of Ozempic  for diabetes management and aware of potential side effects, including weight loss.  Weight concerns and medication effects - Currently using Depo-Provera  and concerned about potential weight gain associated with this medication. - Interested in the weight loss effects of Ozempic  as part of diabetes management.  Renal health history - History of kidney issues during college, attributed to high soda consumption at that time. - Managed kidney issues by reducing soda intake and increasing consumption of cranberry juice and water.  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 - Proactive in controlling iron  levels through diet.   PERTINENT  PMH / PSH: T2DM, IDA  OBJECTIVE:   BP 139/87   Pulse 75   Ht 5' 7 (1.702 m)   Wt 213 lb 9.6 oz (96.9 kg)   SpO2 100%   BMI 33.45 kg/m   General: Alert, pleasant woman. NAD. HEENT: NCAT. MMM. CV: RRR, no murmurs.  Resp: CTAB, no wheezing or crackles. Normal WOB on RA.   Ext: Moves all ext spontaneously Skin: Warm, well perfused   ASSESSMENT/PLAN:   Assessment & Plan Type 2 diabetes mellitus with other specified complication, without long-term current use of insulin (HCC) A1c 7.1, slightly above goal.  Tolerating metformin  well. Additionally interested in weight loss, will start ozempic . Discussed lifestyle modifications including exercise and dietary changes.  Offered dietitian referral, patient is not currently interested. -Start Ozempic  0.25 mg weekly, can increase to 0.5 mg weekly after 4 weeks if tolerating -Continue metformin  500 daily -Encouraged lifestyle changes - BMP and UACR today Type 2 diabetes mellitus without complication, without long-term current use of insulin (HCC)      Twyla Nearing, MD [facility]

## 2024-02-17 NOTE — Patient Instructions (Signed)
 1) For your diabetes - Start taking ozempic 0.25 mg once a week. After 4 weeks, you can increase to 0.5mg  once a week. - You may experience stomach upset, nausea, constipation that improves as you continue to take the medication - If you are unable to tolerate it, you can decrease the dose and let me know - Continue taking your metformin  daily - We will check your labs to check on your kidney health.  2) Come back to see me in 2-3 months. We will recheck your A1c at that time.

## 2024-02-18 LAB — BASIC METABOLIC PANEL WITH GFR
BUN/Creatinine Ratio: 11 (ref 9–23)
BUN: 10 mg/dL (ref 6–20)
CO2: 24 mmol/L (ref 20–29)
Calcium: 9.1 mg/dL (ref 8.7–10.2)
Chloride: 102 mmol/L (ref 96–106)
Creatinine, Ser: 0.9 mg/dL (ref 0.57–1.00)
Glucose: 112 mg/dL — ABNORMAL HIGH (ref 70–99)
Potassium: 3.3 mmol/L — ABNORMAL LOW (ref 3.5–5.2)
Sodium: 138 mmol/L (ref 134–144)
eGFR: 85 mL/min/1.73 (ref >59)

## 2024-02-18 LAB — MICROALBUMIN / CREATININE URINE RATIO
Creatinine, Urine: 66.1 mg/dL
Microalb/Creat Ratio: 13 mg/g{creat} (ref 0–29)
Microalbumin, Urine: 8.5 ug/mL

## 2024-02-18 NOTE — Assessment & Plan Note (Addendum)
 A1c 7.1, slightly above goal. Tolerating metformin  well. Additionally interested in weight loss, will start ozempic. Discussed lifestyle modifications including exercise and dietary changes.  Offered dietitian referral, patient is not currently interested. -Start Ozempic 0.25 mg weekly, can increase to 0.5 mg weekly after 4 weeks if tolerating -Continue metformin  500 daily -Encouraged lifestyle changes - BMP and UACR today

## 2024-02-19 ENCOUNTER — Telehealth: Payer: Self-pay

## 2024-02-19 NOTE — Telephone Encounter (Signed)
 Prior authorization submitted for OZEMPIC 0.25/0.5MG  to [facility] MEDICAID via Latent.   Key: [id number]

## 2024-02-20 NOTE — Telephone Encounter (Signed)
 Pharmacy Patient Advocate Encounter  Received notification from [facility] MEDICAID that Prior Authorization for OZEMPIC  0.25/0.5MG  has been APPROVED from 02/19/24 to 02/18/25   PA #/Case ID/Reference #: [id number]
